# Patient Record
Sex: Male | Born: 1950 | Race: White | Hispanic: No | Marital: Married | Smoking: Never smoker
Health system: Southern US, Community
[De-identification: ages and names within clinical notes are randomized; demographics above are authoritative.]

## PROBLEM LIST (undated history)

## (undated) DIAGNOSIS — K219 Gastro-esophageal reflux disease without esophagitis: Secondary | ICD-10-CM

## (undated) DIAGNOSIS — G709 Myoneural disorder, unspecified: Secondary | ICD-10-CM

## (undated) DIAGNOSIS — I209 Angina pectoris, unspecified: Secondary | ICD-10-CM

## (undated) DIAGNOSIS — F419 Anxiety disorder, unspecified: Secondary | ICD-10-CM

## (undated) DIAGNOSIS — I10 Essential (primary) hypertension: Secondary | ICD-10-CM

## (undated) DIAGNOSIS — R011 Cardiac murmur, unspecified: Secondary | ICD-10-CM

## (undated) DIAGNOSIS — R0602 Shortness of breath: Secondary | ICD-10-CM

## (undated) DIAGNOSIS — Z9289 Personal history of other medical treatment: Secondary | ICD-10-CM

## (undated) DIAGNOSIS — E785 Hyperlipidemia, unspecified: Secondary | ICD-10-CM

## (undated) DIAGNOSIS — T7840XA Allergy, unspecified, initial encounter: Secondary | ICD-10-CM

## (undated) DIAGNOSIS — C801 Malignant (primary) neoplasm, unspecified: Secondary | ICD-10-CM

## (undated) HISTORY — PX: MOHS SURGERY: SUR867

## (undated) HISTORY — DX: Essential (primary) hypertension: I10

## (undated) HISTORY — PX: TESTICLE TORSION REDUCTION: SHX795

## (undated) HISTORY — PX: COLONOSCOPY: SHX174

## (undated) HISTORY — PX: UPPER GASTROINTESTINAL ENDOSCOPY: SHX188

## (undated) HISTORY — DX: Malignant (primary) neoplasm, unspecified: C80.1

## (undated) HISTORY — DX: Hyperlipidemia, unspecified: E78.5

## (undated) HISTORY — DX: Allergy, unspecified, initial encounter: T78.40XA

## (undated) HISTORY — PX: APPENDECTOMY: SHX54

## (undated) HISTORY — DX: Gastro-esophageal reflux disease without esophagitis: K21.9

## (undated) HISTORY — PX: CERVICAL DISC SURGERY: SHX588

---

## 1988-11-20 HISTORY — PX: LUMBAR LAMINECTOMY: SHX95

## 1999-04-15 ENCOUNTER — Encounter: Payer: Self-pay | Admitting: Orthopedic Surgery

## 1999-04-22 ENCOUNTER — Encounter (INDEPENDENT_AMBULATORY_CARE_PROVIDER_SITE_OTHER): Payer: Self-pay | Admitting: Specialist

## 1999-04-22 ENCOUNTER — Encounter: Payer: Self-pay | Admitting: Orthopedic Surgery

## 1999-04-22 ENCOUNTER — Observation Stay (HOSPITAL_COMMUNITY): Admission: RE | Admit: 1999-04-22 | Discharge: 1999-04-23 | Payer: Self-pay | Admitting: Orthopedic Surgery

## 1999-08-17 ENCOUNTER — Emergency Department (HOSPITAL_COMMUNITY): Admission: EM | Admit: 1999-08-17 | Discharge: 1999-08-17 | Payer: Self-pay | Admitting: *Deleted

## 2004-08-03 ENCOUNTER — Ambulatory Visit: Payer: Self-pay | Admitting: Family Medicine

## 2005-08-27 ENCOUNTER — Ambulatory Visit: Payer: Self-pay | Admitting: Family Medicine

## 2005-09-03 ENCOUNTER — Ambulatory Visit: Payer: Self-pay | Admitting: Family Medicine

## 2007-03-24 ENCOUNTER — Telehealth (INDEPENDENT_AMBULATORY_CARE_PROVIDER_SITE_OTHER): Payer: Self-pay | Admitting: *Deleted

## 2007-03-31 ENCOUNTER — Ambulatory Visit: Payer: Self-pay | Admitting: Family Medicine

## 2007-03-31 LAB — CONVERTED CEMR LAB
ALT: 34 units/L (ref 0–53)
AST: 23 units/L (ref 0–37)
Albumin: 3.9 g/dL (ref 3.5–5.2)
Alkaline Phosphatase: 56 units/L (ref 39–117)
BUN: 18 mg/dL (ref 6–23)
Basophils Absolute: 0 10*3/uL (ref 0.0–0.1)
Basophils Relative: 0.4 % (ref 0.0–1.0)
Bilirubin Urine: NEGATIVE
Bilirubin, Direct: 0.2 mg/dL (ref 0.0–0.3)
Blood in Urine, dipstick: NEGATIVE
CO2: 30 meq/L (ref 19–32)
Calcium: 9.1 mg/dL (ref 8.4–10.5)
Chloride: 99 meq/L (ref 96–112)
Cholesterol: 159 mg/dL (ref 0–200)
Creatinine, Ser: 1.1 mg/dL (ref 0.4–1.5)
Eosinophils Absolute: 0.3 10*3/uL (ref 0.0–0.6)
Eosinophils Relative: 5.4 % — ABNORMAL HIGH (ref 0.0–5.0)
GFR calc Af Amer: 90 mL/min
GFR calc non Af Amer: 74 mL/min
Glucose, Bld: 108 mg/dL — ABNORMAL HIGH (ref 70–99)
Glucose, Urine, Semiquant: NEGATIVE
HCT: 41.9 % (ref 39.0–52.0)
HDL: 32.2 mg/dL — ABNORMAL LOW (ref >39.0)
Hemoglobin: 14.4 g/dL (ref 13.0–17.0)
Ketones, urine, test strip: NEGATIVE
LDL Cholesterol: 114 mg/dL — ABNORMAL HIGH (ref 0–99)
Lymphocytes Relative: 23.7 % (ref 12.0–46.0)
MCHC: 34.4 g/dL (ref 30.0–36.0)
MCV: 88.1 fL (ref 78.0–100.0)
Monocytes Absolute: 0.5 10*3/uL (ref 0.2–0.7)
Monocytes Relative: 8.4 % (ref 3.0–11.0)
Neutro Abs: 4.1 10*3/uL (ref 1.4–7.7)
Neutrophils Relative %: 62.1 % (ref 43.0–77.0)
Nitrite: NEGATIVE
PSA: 0.78 ng/mL (ref 0.10–4.00)
Platelets: 223 10*3/uL (ref 150–400)
Potassium: 3.8 meq/L (ref 3.5–5.1)
Protein, U semiquant: NEGATIVE
RBC: 4.76 M/uL (ref 4.22–5.81)
RDW: 13 % (ref 11.5–14.6)
Sodium: 138 meq/L (ref 135–145)
Specific Gravity, Urine: 1.02
TSH: 1.28 microintl units/mL (ref 0.35–5.50)
Total Bilirubin: 1.2 mg/dL (ref 0.3–1.2)
Total CHOL/HDL Ratio: 4.9
Total Protein: 6.2 g/dL (ref 6.0–8.3)
Triglycerides: 64 mg/dL (ref 0–149)
Urobilinogen, UA: 0.2
VLDL: 13 mg/dL (ref 0–40)
WBC Urine, dipstick: NEGATIVE
WBC: 6.4 10*3/uL (ref 4.5–10.5)
pH: 6

## 2007-04-07 ENCOUNTER — Ambulatory Visit: Payer: Self-pay | Admitting: Family Medicine

## 2007-04-07 DIAGNOSIS — I10 Essential (primary) hypertension: Secondary | ICD-10-CM | POA: Insufficient documentation

## 2007-04-07 DIAGNOSIS — K219 Gastro-esophageal reflux disease without esophagitis: Secondary | ICD-10-CM | POA: Insufficient documentation

## 2007-04-07 DIAGNOSIS — E785 Hyperlipidemia, unspecified: Secondary | ICD-10-CM | POA: Insufficient documentation

## 2008-04-19 ENCOUNTER — Ambulatory Visit: Payer: Self-pay | Admitting: Family Medicine

## 2008-04-19 LAB — CONVERTED CEMR LAB
Bilirubin Urine: NEGATIVE
Blood in Urine, dipstick: NEGATIVE
Glucose, Urine, Semiquant: NEGATIVE
Nitrite: NEGATIVE
Specific Gravity, Urine: 1.02
Urobilinogen, UA: 1
WBC Urine, dipstick: NEGATIVE
pH: 7.5

## 2008-04-22 LAB — CONVERTED CEMR LAB
ALT: 28 U/L
AST: 21 U/L
Albumin: 3.8 g/dL
Alkaline Phosphatase: 48 U/L
BUN: 26 mg/dL — ABNORMAL HIGH
Basophils Absolute: 0.1 K/uL
Basophils Relative: 0.8 %
Bilirubin, Direct: 0.1 mg/dL
CO2: 31 meq/L
Calcium: 9.1 mg/dL
Chloride: 102 meq/L
Cholesterol: 140 mg/dL
Creatinine, Ser: 1.3 mg/dL
Eosinophils Absolute: 0.3 K/uL
Eosinophils Relative: 4.4 %
GFR calc Af Amer: 74 mL/min
GFR calc non Af Amer: 61 mL/min
Glucose, Bld: 108 mg/dL — ABNORMAL HIGH
HCT: 42.2 %
HDL: 34.6 mg/dL — ABNORMAL LOW
Hemoglobin: 14.7 g/dL
LDL Cholesterol: 86 mg/dL
Lymphocytes Relative: 21 %
MCHC: 34.9 g/dL
MCV: 89.8 fL
Monocytes Absolute: 0.6 K/uL
Monocytes Relative: 8 %
Neutro Abs: 4.5 K/uL
Neutrophils Relative %: 65.8 %
PSA: 0.84 ng/mL
Platelets: 213 K/uL
Potassium: 3 meq/L — ABNORMAL LOW
RBC: 4.7 M/uL
RDW: 12.6 %
Sodium: 142 meq/L
TSH: 1.2 u[IU]/mL
Total Bilirubin: 0.8 mg/dL
Total CHOL/HDL Ratio: 4
Total Protein: 6.5 g/dL
Triglycerides: 96 mg/dL
VLDL: 19 mg/dL
WBC: 7 10*3/microliter

## 2008-04-26 ENCOUNTER — Ambulatory Visit: Payer: Self-pay | Admitting: Family Medicine

## 2009-04-22 ENCOUNTER — Telehealth: Payer: Self-pay | Admitting: Family Medicine

## 2009-05-19 ENCOUNTER — Telehealth: Payer: Self-pay | Admitting: Family Medicine

## 2009-06-20 ENCOUNTER — Ambulatory Visit: Payer: Self-pay | Admitting: Family Medicine

## 2009-06-20 LAB — CONVERTED CEMR LAB
ALT: 26 units/L (ref 0–53)
AST: 20 units/L (ref 0–37)
Albumin: 4.1 g/dL (ref 3.5–5.2)
Alkaline Phosphatase: 40 units/L (ref 39–117)
BUN: 19 mg/dL (ref 6–23)
Basophils Absolute: 0 10*3/uL (ref 0.0–0.1)
Basophils Relative: 0.4 % (ref 0.0–3.0)
Bilirubin Urine: NEGATIVE
Bilirubin, Direct: 0 mg/dL (ref 0.0–0.3)
Blood in Urine, dipstick: NEGATIVE
CO2: 31 meq/L (ref 19–32)
Calcium: 9.2 mg/dL (ref 8.4–10.5)
Chloride: 99 meq/L (ref 96–112)
Cholesterol: 144 mg/dL (ref 0–200)
Creatinine, Ser: 1.1 mg/dL (ref 0.4–1.5)
Eosinophils Absolute: 0.2 10*3/uL (ref 0.0–0.7)
Eosinophils Relative: 3.9 % (ref 0.0–5.0)
GFR calc non Af Amer: 73.48 mL/min (ref >60)
Glucose, Bld: 107 mg/dL — ABNORMAL HIGH (ref 70–99)
Glucose, Urine, Semiquant: NEGATIVE
HCT: 42.7 % (ref 39.0–52.0)
HDL: 45.2 mg/dL (ref >39.00)
Hemoglobin: 14.7 g/dL (ref 13.0–17.0)
Ketones, urine, test strip: NEGATIVE
LDL Cholesterol: 80 mg/dL (ref 0–99)
Lymphocytes Relative: 25.3 % (ref 12.0–46.0)
Lymphs Abs: 1.5 10*3/uL (ref 0.7–4.0)
MCHC: 34.3 g/dL (ref 30.0–36.0)
MCV: 89.7 fL (ref 78.0–100.0)
Monocytes Absolute: 0.4 10*3/uL (ref 0.1–1.0)
Monocytes Relative: 6.4 % (ref 3.0–12.0)
Neutro Abs: 3.8 10*3/uL (ref 1.4–7.7)
Neutrophils Relative %: 64 % (ref 43.0–77.0)
Nitrite: NEGATIVE
PSA: 1.01 ng/mL (ref 0.10–4.00)
Platelets: 231 10*3/uL (ref 150.0–400.0)
Potassium: 3.6 meq/L (ref 3.5–5.1)
Protein, U semiquant: NEGATIVE
RBC: 4.76 M/uL (ref 4.22–5.81)
RDW: 13.4 % (ref 11.5–14.6)
Sodium: 136 meq/L (ref 135–145)
Specific Gravity, Urine: 1.015
TSH: 0.97 microintl units/mL (ref 0.35–5.50)
Total Bilirubin: 0.6 mg/dL (ref 0.3–1.2)
Total CHOL/HDL Ratio: 3
Total Protein: 6.4 g/dL (ref 6.0–8.3)
Triglycerides: 92 mg/dL (ref 0.0–149.0)
Urobilinogen, UA: 0.2
VLDL: 18.4 mg/dL (ref 0.0–40.0)
WBC Urine, dipstick: NEGATIVE
WBC: 6 10*3/uL (ref 4.5–10.5)
pH: 7

## 2009-06-27 ENCOUNTER — Ambulatory Visit: Payer: Self-pay | Admitting: Family Medicine

## 2009-07-16 ENCOUNTER — Telehealth: Payer: Self-pay | Admitting: Family Medicine

## 2009-11-29 HISTORY — PX: EYE SURGERY: SHX253

## 2010-01-23 ENCOUNTER — Encounter: Payer: Self-pay | Admitting: Family Medicine

## 2010-04-23 NOTE — Progress Notes (Signed)
Summary: special test  Phone Note Call from Patient Call back at [phone]   Caller: Patient Call For: dr todd Summary of Call: pt is sch for cpx labs on 03-28-07 pt was told to ask for a special lab test to be included with cpx labs Initial call taken by: Heron Sabins,  March 24, 2007 1:55 PM    Pull chart

## 2010-04-23 NOTE — Assessment & Plan Note (Signed)
Summary: CPX/CCM   Vital Signs:  Patient Profile:   60 Years Old Male Height:     74.25 inches (191.13 cm) Weight:      268 pounds Temp:     98.4 degrees F oral BP sitting:   138 / 84  (left arm) Cuff size:   regular  Vitals Entered By: Kern Reap CMA (April 26, 2008 9:21 AM)                 Chief Complaint:  cpx.  History of Present Illness: James [name] is a 60 year old, married male, nonsmoker, who works for [employer] and is thinking about retirement after 30 years and comes in today for evaluation of reflux esophagitis, hypertension, hyperlipidemia.  He's always been in excellent health.  He said no chronic health problems except the above.  His blood pressure is treated with Tenoretic 50 -- 25, Cardura 8 mg,.  BP 138/84.  The reflux esophagitis history with Nexium 40 mg q.a.m.  The hyperlipidemia history with Zocor 40 mg nightly along with an aspirin tablet.  Lipids are at goal with an LDL of 86.  A new problem is hypokalemia with a potassium of 3.0, secondary to the diuretic.  Tetanus booster 2005, he said both flu shots 09.     Updated Prior Medication List: NEXIUM 40 MG  CPDR (ESOMEPRAZOLE MAGNESIUM) Take 1 capsule by mouth once a day ATENOLOL-CHLORTHALIDONE 50-25 MG  TABS (ATENOLOL-CHLORTHALIDONE) Take 1 tablet by mouth once a day DOXAZOSIN MESYLATE 8 MG  TABS (DOXAZOSIN MESYLATE) Take 1 tablet by mouth once a day ADULT ASPIRIN EC LOW STRENGTH 81 MG  TBEC (ASPIRIN) qd ZOCOR 40 MG  TABS (SIMVASTATIN) 1 tab @ bedtime * POTASSIUM   Current Allergies: No known allergies   Past Medical History:    Reviewed history from 04/07/2007 and no changes required:       GERD       Hyperlipidemia       Hypertension  Past Surgical History:    Reviewed history from 04/07/2007 and no changes required:       Appendectomy       testicle torsion       cevical disc       cardiac eval       Lumbar laminectomy   Family History:    Reviewed history from 04/07/2007 and no  changes required:       father died at age 84, hypertension, and polio       mother, good health except for skin cancer/melanoma       no brothers       one sister in good health  Social History:    Reviewed history from 04/07/2007 and no changes required:       Occupation: ups       Married       Never Smoked       Alcohol use-no       Drug use-no       Regular exercise-yes       Played baseball it [employer]    Review of Systems      See HPI   Physical Exam  General:     Well-developed,well-nourished,in no acute distress; alert,appropriate and cooperative throughout examination Head:     Normocephalic and atraumatic without obvious abnormalities. No apparent alopecia or balding. Eyes:     No corneal or conjunctival inflammation noted. EOMI. Perrla. Funduscopic exam benign, without hemorrhages, exudates or papilledema. Vision grossly normal. Ears:  External ear exam shows no significant lesions or deformities.  Otoscopic examination reveals clear canals, tympanic membranes are intact bilaterally without bulging, retraction, inflammation or discharge. Hearing is grossly normal bilaterally. Nose:     External nasal examination shows no deformity or inflammation. Nasal mucosa are pink and moist without lesions or exudates. Mouth:     Oral mucosa and oropharynx without lesions or exudates.  Teeth in good repair. Neck:     No deformities, masses, or tenderness noted. Chest Wall:     No deformities, masses, tenderness or gynecomastia noted. Breasts:     No masses or gynecomastia noted Lungs:     Normal respiratory effort, chest expands symmetrically. Lungs are clear to auscultation, no crackles or wheezes. Heart:     Normal rate and regular rhythm. S1 and S2 normal without gallop, murmur, click, rub or other extra sounds. Abdomen:     Bowel sounds positive,abdomen soft and non-tender without masses, organomegaly or hernias noted. Rectal:     No external  abnormalities noted. Normal sphincter tone. No rectal masses or tenderness. Genitalia:     Testes bilaterally descended without nodularity, tenderness or masses. No scrotal masses or lesions. No penis lesions or urethral discharge. Prostate:     Prostate gland firm and smooth, no enlargement, nodularity, tenderness, mass, asymmetry or induration. Msk:     No deformity or scoliosis noted of thoracic or lumbar spine.   Pulses:     R and L carotid,radial,femoral,dorsalis pedis and posterior tibial pulses are full and equal bilaterally Extremities:     No clubbing, cyanosis, edema, or deformity noted with normal full range of motion of all joints.   Neurologic:     No cranial nerve deficits noted. Station and gait are normal. Plantar reflexes are down-going bilaterally. DTRs are symmetrical throughout. Sensory, motor and coordinative functions appear intact. Skin:     Intact without suspicious lesions or rashes Cervical Nodes:     No lymphadenopathy noted Axillary Nodes:     No palpable lymphadenopathy Inguinal Nodes:     No significant adenopathy Psych:     Cognition and judgment appear intact. Alert and cooperative with normal attention span and concentration. No apparent delusions, illusions, hallucinations    Impression & Recommendations:  Problem # 1:  HYPERTENSION (ICD-401.9) Assessment: Improved  His updated medication list for this problem includes:    Atenolol-chlorthalidone 50-25 Mg Tabs (Atenolol-chlorthalidone) .Marland Kitchen... Take 1 tablet by mouth once a day    Doxazosin Mesylate 8 Mg Tabs (Doxazosin mesylate) .Marland Kitchen... Take 1 tablet by mouth once a day  Orders: EKG w/ Interpretation (93000)   Problem # 2:  HYPERLIPIDEMIA (ICD-272.4) Assessment: Improved  His updated medication list for this problem includes:    Zocor 40 Mg Tabs (Simvastatin) .Marland Kitchen... 1 tab @ bedtime  Orders: EKG w/ Interpretation (93000)   Problem # 3:  GERD (ICD-530.81) Assessment: Improved  His updated  medication list for this problem includes:    Nexium 40 Mg Cpdr (Esomeprazole magnesium) .Marland Kitchen... Take 1 capsule by mouth once a day   Complete Medication List: 1)  Nexium 40 Mg Cpdr (Esomeprazole magnesium) .... Take 1 capsule by mouth once a day 2)  Atenolol-chlorthalidone 50-25 Mg Tabs (Atenolol-chlorthalidone) .... Take 1 tablet by mouth once a day 3)  Doxazosin Mesylate 8 Mg Tabs (Doxazosin mesylate) .... Take 1 tablet by mouth once a day 4)  Adult Aspirin Ec Low Strength 81 Mg Tbec (Aspirin) .... Qd 5)  Zocor 40 Mg Tabs (Simvastatin) .Marland Kitchen.. 1 tab @  bedtime 6)  Klor-con M20 20 Meq Cr-tabs (Potassium chloride crys cr) .... Take 1 tablet by mouth every morning   Patient Instructions: 1)  Please schedule a follow-up appointment in 1 year.   Prescriptions: KLOR-CON M20 20 MEQ CR-TABS (POTASSIUM CHLORIDE CRYS CR) Take 1 tablet by mouth every morning  #100 x 3   Entered and Authorized by:   Roderick Pee MD   Signed by:   Roderick Pee MD on 04/26/2008   Method used:   [facility] to        [facility] [phone]* (retail)       [address]       [place], [place]  [address]       Ph: [phone]       Fax: [phone]   RxID:   [id number] ZOCOR 40 MG  TABS (SIMVASTATIN) 1 tab @ bedtime  #100 x 4   Entered and Authorized by:   Roderick Pee MD   Signed by:   Roderick Pee MD on 04/26/2008   Method used:   [facility]ically to        [facility] [phone]* (retail)       [address]       [place], [place]  [address]       Ph: [phone]       Fax: [phone]   RxID:   [id number] DOXAZOSIN MESYLATE 8 MG  TABS (DOXAZOSIN MESYLATE) Take 1 tablet by mouth once a day  #100 x 4   Entered and Authorized by:   Roderick Pee MD   Signed by:   Roderick Pee MD on 04/26/2008   Method used:   [facility] to        [facility] [phone]* (retail)       [address]       [place], [place]  [address]       Ph: [phone]       Fax: [phone]   RxID:   [id number] ATENOLOL-CHLORTHALIDONE 50-25 MG  TABS (ATENOLOL-CHLORTHALIDONE) Take 1 tablet by mouth once a day  #100 x 3   Entered and Authorized by:   Roderick Pee MD   Signed by:   Roderick Pee MD on 04/26/2008   Method used:   Electronically to        [facility] [phone]* (retail)       [address]       [place], [place]  [address]       Ph: [phone]       Fax: [phone]   RxID:   [id number] NEXIUM 40 MG  CPDR (ESOMEPRAZOLE MAGNESIUM) Take 1 capsule by mouth once a day  #100 x 4   Entered and Authorized by:   Roderick Pee MD   Signed by:   Roderick Pee MD on 04/26/2008   Method used:   Electronically to        [facility] [phone]* (retail)       [address]       [place], [place]  [address]       Ph: [phone]       Fax: [phone]   RxID:   [id number]

## 2010-04-23 NOTE — Assessment & Plan Note (Signed)
Summary: CPX // RS   Vital Signs:  Patient profile:   60 year old male Height:      74.25 inches Weight:      248 pounds BMI:     31.74 Temp:     98.8 degrees F oral BP sitting:   120 / 80  (left arm) Cuff size:   regular  Vitals Entered By: Kern Reap CMA Duncan Dull) (June 27, 2009 8:34 AM) CC: cpx   CC:  cpx.  History of Present Illness: James [name] is a 60 year old male, married, nonsmoker, who comes in today for physical examination because of underlying hyperlipidemia and hypertension and reflux esophagitis.  His hyperlipidemia is treated with Zocor 40 mg nightly lipids are goal with an LDL of 80.  His hypertension is treated with Tenoretic 50 -- 25 daily, Cardura 8 mg daily, and potassium supplement 20 mEq one daily.  BP 120/80.  He also uses Nexium 40 mg daily for reflux esophagitis and asymptomatic on medication.  He gets routine eye care and dental care.  Colonoscopy done, and GI, tetanus, 2005, seasonal flu shot 2010.  His weight is down to 248.  He's lost 30 pounds this past winter by stopping drinking sweet tea.  Allergies: No Known Drug Allergies  Past History:  Past medical, surgical, family and social histories (including risk factors) reviewed, and no changes noted (except as noted below).  Past Medical History: Reviewed history from 04/07/2007 and no changes required. GERD Hyperlipidemia Hypertension  Past Surgical History: Reviewed history from 04/26/2008 and no changes required. Appendectomy testicle torsion cevical disc cardiac eval Lumbar laminectomy  Family History: Reviewed history from 04/07/2007 and no changes required. father died at age 22, hypertension, and polio mother, good health except for skin cancer/melanoma no brothers one sister in good health  Social History: Reviewed history from 04/26/2008 and no changes required. Occupation: [employer] Married Never Smoked Alcohol use-no Drug use-no Regular exercise-yes Played baseball it  [employer]  Review of Systems      See HPI  Physical Exam  General:  Well-developed,well-nourished,in no acute distress; alert,appropriate and cooperative throughout examination Head:  Normocephalic and atraumatic without obvious abnormalities. No apparent alopecia or balding. Eyes:  No corneal or conjunctival inflammation noted. EOMI. Perrla. Funduscopic exam benign, without hemorrhages, exudates or papilledema. Vision grossly normal. Ears:  External ear exam shows no significant lesions or deformities.  Otoscopic examination reveals clear canals, tympanic membranes are intact bilaterally without bulging, retraction, inflammation or discharge. Hearing is grossly normal bilaterally. Nose:  External nasal examination shows no deformity or inflammation. Nasal mucosa are pink and moist without lesions or exudates. Mouth:  Oral mucosa and oropharynx without lesions or exudates.  Teeth in good repair. Neck:  No deformities, masses, or tenderness noted. Chest Wall:  No deformities, masses, tenderness or gynecomastia noted. Breasts:  No masses or gynecomastia noted Lungs:  Normal respiratory effort, chest expands symmetrically. Lungs are clear to auscultation, no crackles or wheezes. Heart:  Normal rate and regular rhythm. S1 and S2 normal without gallop, murmur, click, rub or other extra sounds. Abdomen:  Bowel sounds positive,abdomen soft and non-tender without masses, organomegaly or hernias noted. Rectal:  No external abnormalities noted. Normal sphincter tone. No rectal masses or tenderness. Genitalia:  Testes bilaterally descended without nodularity, tenderness or masses. No scrotal masses or lesions. No penis lesions or urethral discharge. Prostate:  Prostate gland firm and smooth, no enlargement, nodularity, tenderness, mass, asymmetry or induration. Msk:  No deformity or scoliosis noted of thoracic  or lumbar spine.   Pulses:  R and L carotid,radial,femoral,dorsalis pedis and posterior  tibial pulses are full and equal bilaterally Extremities:  No clubbing, cyanosis, edema, or deformity noted with normal full range of motion of all joints.   Neurologic:  No cranial nerve deficits noted. Station and gait are normal. Plantar reflexes are down-going bilaterally. DTRs are symmetrical throughout. Sensory, motor and coordinative functions appear intact. Skin:  Intact without suspicious lesions or rashes Cervical Nodes:  No lymphadenopathy noted Axillary Nodes:  No palpable lymphadenopathy Inguinal Nodes:  No significant adenopathy Psych:  Cognition and judgment appear intact. Alert and cooperative with normal attention span and concentration. No apparent delusions, illusions, hallucinations   Impression & Recommendations:  Problem # 1:  HYPERTENSION (ICD-401.9) Assessment Improved  His updated medication list for this problem includes:    Atenolol-chlorthalidone 50-25 Mg Tabs (Atenolol-chlorthalidone) .Marland Kitchen... Take 1 tablet by mouth once a day    Doxazosin Mesylate 8 Mg Tabs (Doxazosin mesylate) .Marland Kitchen... Take 1 tablet by mouth once a day  Orders: Prescription Created Electronically [phone]) EKG w/ Interpretation (93[address])  Problem # 2:  HYPERLIPIDEMIA (ICD-272.4) Assessment: Improved  His updated medication list for this problem includes:    Zocor 40 Mg Tabs (Simvastatin) .Marland Kitchen... 1 tab @ bedtime  Orders: Prescription Created Electronically [phone]) EKG w/ Interpretation (93000)  Problem # 3:  GERD (ICD-530.81) Assessment: Improved  His updated medication list for this problem includes:    Nexium 40 Mg Cpdr (Esomeprazole magnesium) .Marland Kitchen... Take 1 capsule by mouth once a day  Orders: Prescription Created Electronically [phone])  Problem # 4:  PHYSICAL EXAMINATION (ICD-V70.0) Assessment: Unchanged  Orders: Prescription Created Electronically [phone])  Complete Medication List: 1)  Nexium 40 Mg Cpdr (Esomeprazole magnesium) .... Take 1 capsule by mouth once a day 2)   Atenolol-chlorthalidone 50-25 Mg Tabs (Atenolol-chlorthalidone) .... Take 1 tablet by mouth once a day 3)  Doxazosin Mesylate 8 Mg Tabs (Doxazosin mesylate) .... Take 1 tablet by mouth once a day 4)  Adult Aspirin Ec Low Strength 81 Mg Tbec (Aspirin) .... Qd 5)  Zocor 40 Mg Tabs (Simvastatin) .Marland Kitchen.. 1 tab @ bedtime 6)  Klor-con M20 20 Meq Cr-tabs (Potassium chloride crys cr) .... Take 1 tablet by mouth every morning  Patient Instructions: 1)  Please schedule a follow-up appointment in 1 year. 2)  It is important that you exercise regularly at least 20 minutes 5 times a week. If you develop chest pain, have severe difficulty breathing, or feel very tired , stop exercising immediately and seek medical attention. Prescriptions: KLOR-CON M20 20 MEQ CR-TABS (POTASSIUM CHLORIDE CRYS CR) Take 1 tablet by mouth every morning  #100 x 3   Entered and Authorized by:   Roderick Pee MD   Signed by:   Roderick Pee MD on 06/27/2009   Method used:   [facility] to        [facility] [phone]* (retail)       [address]       [place], [place]  [address]       Ph: [phone]       Fax: [phone]   RxID:   [id number] ZOCOR 40 MG  TABS (SIMVASTATIN) 1 tab @ bedtime  #100 x 4   Entered and Authorized by:   Roderick Pee MD   Signed by:   Roderick Pee MD on 06/27/2009   Method used:   [facility] to  [facility] [phone]* (retail)       [address]       [place], [place]  [address]       Ph: [phone]       Fax: [phone]   RxID:   [id number] DOXAZOSIN MESYLATE 8 MG  TABS (DOXAZOSIN MESYLATE) Take 1 tablet by mouth once a day  #100 x 4   Entered and Authorized by:   Roderick Pee MD   Signed by:   Roderick Pee MD on 06/27/2009   Method used:   Electronically to        [facility] [phone]* (retail)       [address]       [place], [place]  [address]       Ph:  [phone]       Fax: [phone]   RxID:   [id number] ATENOLOL-CHLORTHALIDONE 50-25 MG  TABS (ATENOLOL-CHLORTHALIDONE) Take 1 tablet by mouth once a day  #100 x 3   Entered and Authorized by:   Roderick Pee MD   Signed by:   Roderick Pee MD on 06/27/2009   Method used:   Electronically to        [facility] [phone]* (retail)       [address]       [place], [place]  [address]       Ph: [phone]       Fax: [phone]   RxID:   [id number] NEXIUM 40 MG  CPDR (ESOMEPRAZOLE MAGNESIUM) Take 1 capsule by mouth once a day  #100 x 4   Entered and Authorized by:   Roderick Pee MD   Signed by:   Roderick Pee MD on 06/27/2009   Method used:   Electronically to        [facility] [phone]* (retail)       [address]       [place], [place]  [address]       Ph: [phone]       Fax: [phone]   RxID:   [id number]    Immunization History:  Influenza Immunization History:    Influenza:  historical (12/20/2008)

## 2010-04-23 NOTE — Assessment & Plan Note (Signed)
Summary: cpx/nta   Vital Signs:  Patient Profile:   60 Years Old Male Height:     75.25 inches (191.13 cm) Weight:      251 pounds (114.09 kg) Temp:     98.5 degrees F (36.94 degrees C) oral Pulse rate:   79 / minute BP sitting:   130 / 80  (right arm)  Pt. in pain?   no  Vitals Entered By: Arcola Jansky, RN (April 07, 2007 9:46 AM)                  Chief Complaint:  CPX and LABS DONE.  History of Present Illness: James [name] is a 60 year old male who comes in today for evaluation of hypertension, hyperlipidemia, reflux, esophagitis,  Problem number one hypertension.  He checks his blood pressure sporadically at work.  He takes Tenoretic 5020 five half a tablet a day with good blood pressure control.  Is currently taking Lipitor 20 mg daily for hyperlipidemia.  We discussed switching to go to Zocor because of cost implications is okay with that.  He also takes Cardura to help with his blood pressure.  He also takes 40 mg of Nexium because of chronic reflux esophagitis.  Asymptomatic.  If he doesn't take his medications.  Acute Visit History:      He denies abdominal pain, chest pain, constipation, cough, diarrhea, earache, eye symptoms, fever, genitourinary symptoms, headache, musculoskeletal symptoms, nasal discharge, nausea, rash, sinus problems, sore throat, and vomiting.        The patient has a prior history of GERD.  Pertinent previous surgeries include appendectomy.         Current Allergies (reviewed today): No known allergies   Past Medical History:    Reviewed history and no changes required:       GERD       Hyperlipidemia       Hypertension  Past Surgical History:    Appendectomy    testicle torsion    cevical disc    cardiac eval   Family History:    Reviewed history and no changes required:       father died at age 89, hypertension, and polio       mother, good health except for skin cancer/melanoma       no brothers       one sister in  good health  Social History:    Reviewed history and no changes required:       Occupation: ups       Married       Never Smoked       Alcohol use-no       Drug use-no       Regular exercise-yes   Risk Factors:  Tobacco use:  never Drug use:  no Alcohol use:  no Exercise:  yes   Review of Systems      See HPI   Physical Exam  General:     Well-developed,well-nourished,in no acute distress; alert,appropriate and cooperative throughout examination Head:     Normocephalic and atraumatic without obvious abnormalities. No apparent alopecia or balding. Eyes:     No corneal or conjunctival inflammation noted. EOMI. Perrla. Funduscopic exam benign, without hemorrhages, exudates or papilledema. Vision grossly normal. Ears:     External ear exam shows no significant lesions or deformities.  Otoscopic examination reveals clear canals, tympanic membranes are intact bilaterally without bulging, retraction, inflammation or discharge. Hearing is grossly normal bilaterally. Nose:     External  nasal examination shows no deformity or inflammation. Nasal mucosa are pink and moist without lesions or exudates. Mouth:     Oral mucosa and oropharynx without lesions or exudates.  Teeth in good repair. Neck:     No deformities, masses, or tenderness noted. Chest Wall:     No deformities, masses, tenderness or gynecomastia noted. Breasts:     No masses or gynecomastia noted Lungs:     Normal respiratory effort, chest expands symmetrically. Lungs are clear to auscultation, no crackles or wheezes. Heart:     Normal rate and regular rhythm. S1 and S2 normal without gallop, murmur, click, rub or other extra sounds. Abdomen:     Bowel sounds positive,abdomen soft and non-tender without masses, organomegaly or hernias noted. Rectal:     No external abnormalities noted. Normal sphincter tone. No rectal masses or tenderness. Genitalia:     Testes bilaterally descended without nodularity,  tenderness or masses. No scrotal masses or lesions. No penis lesions or urethral discharge. Prostate:     Prostate gland firm and smooth, no enlargement, nodularity, tenderness, mass, asymmetry or induration. Msk:     No deformity or scoliosis noted of thoracic or lumbar spine.   Pulses:     R and L carotid,radial,femoral,dorsalis pedis and posterior tibial pulses are full and equal bilaterally Extremities:     No clubbing, cyanosis, edema, or deformity noted with normal full range of motion of all joints.   Neurologic:     No cranial nerve deficits noted. Station and gait are normal. Plantar reflexes are down-going bilaterally. DTRs are symmetrical throughout. Sensory, motor and coordinative functions appear intact. Skin:     Intact without suspicious lesions or rashes Cervical Nodes:     No lymphadenopathy noted Axillary Nodes:     No palpable lymphadenopathy Inguinal Nodes:     No significant adenopathy Psych:     Cognition and judgment appear intact. Alert and cooperative with normal attention span and concentration. No apparent delusions, illusions, hallucinations    Impression & Recommendations:  Problem # 1:  GERD (ICD-530.81) Assessment: Improved  His updated medication list for this problem includes:    Nexium 40 Mg Cpdr (Esomeprazole magnesium) .Marland Kitchen... Take 1 capsule by mouth once a day   Problem # 2:  HYPERLIPIDEMIA (ICD-272.4) Assessment: Improved  His updated medication list for this problem includes:    Zocor 40 Mg Tabs (Simvastatin) .Marland Kitchen... 1 tab @ bedtime   Problem # 3:  HYPERTENSION (ICD-401.9) Assessment: Improved  His updated medication list for this problem includes:    Atenolol-chlorthalidone 50-25 Mg Tabs (Atenolol-chlorthalidone) .Marland Kitchen... Take 1 tablet by mouth once a day    Doxazosin Mesylate 8 Mg Tabs (Doxazosin mesylate) .Marland Kitchen... Take 1 tablet by mouth once a day   Complete Medication List: 1)  Nexium 40 Mg Cpdr (Esomeprazole magnesium) .... Take 1  capsule by mouth once a day 2)  Atenolol-chlorthalidone 50-25 Mg Tabs (Atenolol-chlorthalidone) .... Take 1 tablet by mouth once a day 3)  Doxazosin Mesylate 8 Mg Tabs (Doxazosin mesylate) .... Take 1 tablet by mouth once a day 4)  Adult Aspirin Ec Low Strength 81 Mg Tbec (Aspirin) .... Qd 5)  Zocor 40 Mg Tabs (Simvastatin) .Marland Kitchen.. 1 tab @ bedtime  Other Orders: EKG w/ Interpretation (93000)   Patient Instructions: 1)  Please schedule a follow-up appointment in 1 year. 2)  It is important that you exercise regularly at least 20 minutes 5 times a week. If you develop chest pain, have severe difficulty breathing, or  feel very tired , stop exercising immediately and seek medical attention. 3)  Take an Aspirin every day.    Prescriptions: DOXAZOSIN MESYLATE 8 MG  TABS (DOXAZOSIN MESYLATE) Take 1 tablet by mouth once a day  #100 x 4   Entered and Authorized by:   Roderick Pee MD   Signed by:   Roderick Pee MD on 04/07/2007   Method used:   Print then Give to Patient   RxID:   [id number] ATENOLOL-CHLORTHALIDONE 50-25 MG  TABS (ATENOLOL-CHLORTHALIDONE) Take 1 tablet by mouth once a day  #50 x 4   Entered and Authorized by:   Roderick Pee MD   Signed by:   Roderick Pee MD on 04/07/2007   Method used:   Print then Give to Patient   RxID:   [id number] NEXIUM 40 MG  CPDR (ESOMEPRAZOLE MAGNESIUM) Take 1 capsule by mouth once a day  #100 x 4   Entered and Authorized by:   Roderick Pee MD   Signed by:   Roderick Pee MD on 04/07/2007   Method used:   Print then Give to Patient   RxID:   [id number] ZOCOR 40 MG  TABS (SIMVASTATIN) 1 tab @ bedtime  #100 x 4   Entered and Authorized by:   Roderick Pee MD   Signed by:   Roderick Pee MD on 04/07/2007   Method used:   Print then Give to Patient   RxID:   [id number]  ]  Tetanus/Td Immunization History:    Tetanus/Td # 1:  Td (03/23/2003)  Influenza Immunization History:    Influenza # 1:  Historical  (12/21/2006)

## 2010-04-23 NOTE — Progress Notes (Signed)
Summary: REFILL  Phone Note Refill Request Message from:  Fax from Pharmacy  Refills Requested: Medication #1:  KLOR-CON M20 20 MEQ CR-TABS Take 1 tablet by mouth every morning. [phone]---[phone]    FAX---[phone]  Initial call taken by: Warnell Forester,  May 19, 2009 8:50 AM    Prescriptions: KLOR-CON M20 20 MEQ CR-TABS (POTASSIUM CHLORIDE CRYS CR) Take 1 tablet by mouth every morning  #100 x 3   Entered by:   Kern Reap CMA (AAMA)   Authorized by:   Roderick Pee MD   Signed by:   Kern Reap CMA (AAMA) on 05/19/2009   Method used:   Electronically to        [facility] [phone][facility] (retail)       [address]       [place], [place]  [address]       Ph: [phone]       Fax: [phone]   RxID:   [id number]

## 2010-04-23 NOTE — Miscellaneous (Signed)
Summary: eye exam  Clinical Lists Changes       Eye Exam  significant right cataract

## 2010-04-23 NOTE — Progress Notes (Signed)
Summary: refill  Phone Note Refill Request Message from:  Fax from Pharmacy on April 22, 2009 3:22 PM  Refills Requested: Medication #1:  KLOR-CON M20 20 MEQ CR-TABS Take 1 tablet by mouth every morning.  Method Requested: Electronic Initial call taken by: Kern Reap CMA Duncan Dull),  April 22, 2009 3:22 PM    Prescriptions: KLOR-CON M20 20 MEQ CR-TABS (POTASSIUM CHLORIDE CRYS CR) Take 1 tablet by mouth every morning  #100 x 3   Entered by:   Kern Reap CMA (AAMA)   Authorized by:   Roderick Pee MD   Signed by:   Kern Reap CMA (AAMA) on 04/22/2009   Method used:   Electronically to        [facility] [phone]* (retail)       [address]       [place], [place]  [address]       Ph: [phone]       Fax: [phone]   RxID:   [id number]

## 2010-04-23 NOTE — Progress Notes (Signed)
Summary: nexium refill  Phone Note Refill Request Message from:  Fax from Pharmacy on July 16, 2009 11:43 AM  Refills Requested: Medication #1:  NEXIUM 40 MG  CPDR Take 1 capsule by mouth once a day Initial call taken by: Kern Reap CMA Duncan Dull),  July 16, 2009 11:43 AM    Prescriptions: NEXIUM 40 MG  CPDR (ESOMEPRAZOLE MAGNESIUM) Take 1 capsule by mouth once a day  #100 x 4   Entered by:   Kern Reap CMA (AAMA)   Authorized by:   Roderick Pee MD   Signed by:   Kern Reap CMA (AAMA) on 07/16/2009   Method used:   Electronically to        [facility] [phone]* (retail)       [address]       [place], [place]  [address]       Ph: [phone]       Fax: [phone]   RxID:   [id number]

## 2010-05-22 ENCOUNTER — Other Ambulatory Visit: Payer: Self-pay | Admitting: Family Medicine

## 2010-06-19 ENCOUNTER — Other Ambulatory Visit (INDEPENDENT_AMBULATORY_CARE_PROVIDER_SITE_OTHER): Payer: BC Managed Care – PPO

## 2010-06-19 DIAGNOSIS — Z0000 Encounter for general adult medical examination without abnormal findings: Secondary | ICD-10-CM

## 2010-06-19 LAB — POCT URINALYSIS DIPSTICK
Bilirubin, UA: NEGATIVE
Blood, UA: NEGATIVE
Glucose, UA: NEGATIVE
Ketones, UA: NEGATIVE
Leukocytes, UA: NEGATIVE
Nitrite, UA: NEGATIVE
Protein, UA: NEGATIVE
Spec Grav, UA: 1.015
Urobilinogen, UA: 0.2
pH, UA: 7

## 2010-06-19 LAB — LIPID PANEL
Cholesterol: 149 mg/dL (ref 0–200)
HDL: 38.7 mg/dL — ABNORMAL LOW (ref >39.00)
LDL Cholesterol: 92 mg/dL (ref 0–99)
Total CHOL/HDL Ratio: 4
Triglycerides: 93 mg/dL (ref 0.0–149.0)
VLDL: 18.6 mg/dL (ref 0.0–40.0)

## 2010-06-19 LAB — HEPATIC FUNCTION PANEL
ALT: 25 U/L (ref 0–53)
AST: 22 U/L (ref 0–37)
Albumin: 3.9 g/dL (ref 3.5–5.2)
Alkaline Phosphatase: 50 U/L (ref 39–117)
Bilirubin, Direct: 0.2 mg/dL (ref 0.0–0.3)
Total Bilirubin: 0.7 mg/dL (ref 0.3–1.2)
Total Protein: 6.3 g/dL (ref 6.0–8.3)

## 2010-06-19 LAB — BASIC METABOLIC PANEL
BUN: 20 mg/dL (ref 6–23)
CO2: 32 mEq/L (ref 19–32)
Calcium: 9 mg/dL (ref 8.4–10.5)
Chloride: 102 mEq/L (ref 96–112)
Creatinine, Ser: 1.2 mg/dL (ref 0.4–1.5)
GFR: 69.56 mL/min (ref >60.00)
Glucose, Bld: 102 mg/dL — ABNORMAL HIGH (ref 70–99)
Potassium: 3.3 mEq/L — ABNORMAL LOW (ref 3.5–5.1)
Sodium: 141 mEq/L (ref 135–145)

## 2010-06-19 LAB — CBC WITH DIFFERENTIAL/PLATELET
Basophils Absolute: 0 10*3/uL (ref 0.0–0.1)
Basophils Relative: 0.4 % (ref 0.0–3.0)
Eosinophils Absolute: 0.3 10*3/uL (ref 0.0–0.7)
Eosinophils Relative: 5 % (ref 0.0–5.0)
HCT: 43.3 % (ref 39.0–52.0)
Hemoglobin: 14.8 g/dL (ref 13.0–17.0)
Lymphocytes Relative: 25.1 % (ref 12.0–46.0)
Lymphs Abs: 1.6 10*3/uL (ref 0.7–4.0)
MCHC: 34.2 g/dL (ref 30.0–36.0)
MCV: 89.6 fl (ref 78.0–100.0)
Monocytes Absolute: 0.5 10*3/uL (ref 0.1–1.0)
Monocytes Relative: 8.1 % (ref 3.0–12.0)
Neutro Abs: 3.9 10*3/uL (ref 1.4–7.7)
Neutrophils Relative %: 61.4 % (ref 43.0–77.0)
Platelets: 228 10*3/uL (ref 150.0–400.0)
RBC: 4.83 Mil/uL (ref 4.22–5.81)
RDW: 13.5 % (ref 11.5–14.6)
WBC: 6.3 10*3/uL (ref 4.5–10.5)

## 2010-06-19 LAB — PSA: PSA: 0.94 ng/mL (ref 0.10–4.00)

## 2010-06-19 LAB — TSH: TSH: 0.98 u[IU]/mL (ref 0.35–5.50)

## 2010-07-03 ENCOUNTER — Other Ambulatory Visit: Payer: Self-pay | Admitting: Family Medicine

## 2010-07-03 ENCOUNTER — Ambulatory Visit: Payer: Self-pay | Admitting: Family Medicine

## 2010-07-04 ENCOUNTER — Other Ambulatory Visit: Payer: Self-pay | Admitting: Family Medicine

## 2010-07-31 ENCOUNTER — Encounter: Payer: Self-pay | Admitting: Family Medicine

## 2010-08-03 ENCOUNTER — Encounter: Payer: Self-pay | Admitting: Family Medicine

## 2010-08-03 ENCOUNTER — Ambulatory Visit (INDEPENDENT_AMBULATORY_CARE_PROVIDER_SITE_OTHER): Payer: BC Managed Care – PPO | Admitting: Family Medicine

## 2010-08-03 VITALS — BP 130/88 | Temp 99.0°F | Ht 74.5 in | Wt 252.0 lb

## 2010-08-03 DIAGNOSIS — I10 Essential (primary) hypertension: Secondary | ICD-10-CM

## 2010-08-03 DIAGNOSIS — E785 Hyperlipidemia, unspecified: Secondary | ICD-10-CM

## 2010-08-03 DIAGNOSIS — K219 Gastro-esophageal reflux disease without esophagitis: Secondary | ICD-10-CM

## 2010-08-03 MED ORDER — POTASSIUM CHLORIDE CRYS ER 20 MEQ PO TBCR: 20.0000 meq | EXTENDED_RELEASE_TABLET | Freq: Every day | ORAL | Status: DC

## 2010-08-03 MED ORDER — ESOMEPRAZOLE MAGNESIUM 40 MG PO CPDR: 40.0000 mg | DELAYED_RELEASE_CAPSULE | Freq: Every day | ORAL | Status: DC

## 2010-08-03 MED ORDER — SIMVASTATIN 40 MG PO TABS: 40.0000 mg | ORAL_TABLET | Freq: Every day | ORAL | Status: DC

## 2010-08-03 MED ORDER — DOXAZOSIN MESYLATE 8 MG PO TABS: 8.0000 mg | ORAL_TABLET | Freq: Every day | ORAL | Status: DC

## 2010-08-03 MED ORDER — ATENOLOL-CHLORTHALIDONE 50-25 MG PO TABS: 1.0000 | ORAL_TABLET | Freq: Every day | ORAL | Status: DC

## 2010-08-03 NOTE — Progress Notes (Signed)
  Subjective:    Patient ID: James [name], male    DOB: August 26, 1950, 60 y.o.   MRN: [id number]  HPI  [name] is a 60 y/o married male nonsmoker who comes in  Today For his annual physical examination because of a history of underlying hypertension, reflux, esophagitis, and hyperlipidemia.  For hypertension.  He takes Tenoretic 50 -- 25 daily.  BP 130/88.  Four reflux esophagitis.  He takes Nexium 40 mg daily.  For hyperlipidemia.  He takes simvastatin 40 mg daily.  He also takes Cardura 8 mg daily for hypertension, and a potassium supplement.  He gets routine eye care,,,,,,,,,,, recent right cataract removal.  Routine dental care, and colonoscopy 2005, normal tetanus 2005.   Review of Systems  Constitutional: Negative.   HENT: Negative.   Eyes: Negative.   Respiratory: Negative.   Cardiovascular: Negative.   Gastrointestinal: Negative.   Genitourinary: Negative.   Musculoskeletal: Negative.   Skin: Negative.   Neurological: Negative.   Hematological: Negative.   Psychiatric/Behavioral: Negative.        Objective:   Physical Exam  Constitutional: He is oriented to person, place, and time. He appears well-developed and well-nourished.  HENT:  Head: Normocephalic and atraumatic.  Right Ear: External ear normal.  Left Ear: External ear normal.  Nose: Nose normal.  Mouth/Throat: Oropharynx is clear and moist.  Eyes: Conjunctivae and EOM are normal. Pupils are equal, round, and reactive to light.  Neck: Normal range of motion. Neck supple. No JVD present. No tracheal deviation present. No thyromegaly present.  Cardiovascular: Normal rate, regular rhythm, normal heart sounds and intact distal pulses.  Exam reveals no gallop and no friction rub.   No murmur heard. Pulmonary/Chest: Effort normal and breath sounds normal. No stridor. No respiratory distress. He has no wheezes. He has no rales. He exhibits no tenderness.  Abdominal: Soft. Bowel sounds are normal. He exhibits  no distension and no mass. There is no tenderness. There is no rebound and no guarding.  Genitourinary: Rectum normal, prostate normal and penis normal. Guaiac negative stool. No penile tenderness.  Musculoskeletal: Normal range of motion. He exhibits no edema and no tenderness.  Lymphadenopathy:    He has no cervical adenopathy.  Neurological: He is alert and oriented to person, place, and time. He has normal reflexes. No cranial nerve deficit. He exhibits normal muscle tone.  Skin: Skin is warm and dry. No rash noted. No erythema. No pallor.  Psychiatric: He has a normal mood and affect. His behavior is normal. Judgment and thought content normal.          Assessment & Plan:  Hypertension continue Tenoretic 50 -- 25 daily, Cardura, 8 mg daily, and one, potassium supplement.  Reflux esophagitis.  Continue Nexium 40 mg daily.  Hyperlipidemia.  Continue simvastatin 40 mg daily.  Rectal irritation use the steroid cream 3 times daily

## 2010-08-03 NOTE — Patient Instructions (Signed)
Continue your current medications.  Follow-up in one year, sooner if any problems.  Use the OTC steroid cream two to 3 times daily for the rectal irritation

## 2010-08-04 ENCOUNTER — Ambulatory Visit: Payer: Self-pay | Admitting: Family Medicine

## 2010-08-07 ENCOUNTER — Telehealth: Payer: Self-pay | Admitting: Family Medicine

## 2010-08-07 NOTE — Telephone Encounter (Signed)
i spoke with the pharmacy and they have been ready from 07/05/10.

## 2010-08-07 NOTE — Telephone Encounter (Signed)
Please resend his 5 rxs for 90 day supply w/[facility]ills [facility]

## 2010-11-24 ENCOUNTER — Ambulatory Visit (INDEPENDENT_AMBULATORY_CARE_PROVIDER_SITE_OTHER): Payer: BC Managed Care – PPO | Admitting: Internal Medicine

## 2010-11-24 ENCOUNTER — Telehealth: Payer: Self-pay | Admitting: *Deleted

## 2010-11-24 ENCOUNTER — Encounter: Payer: Self-pay | Admitting: Internal Medicine

## 2010-11-24 ENCOUNTER — Ambulatory Visit (INDEPENDENT_AMBULATORY_CARE_PROVIDER_SITE_OTHER)
Admission: RE | Admit: 2010-11-24 | Discharge: 2010-11-24 | Disposition: A | Discharge status: Home / Self Care | Payer: BC Managed Care – PPO | Source: Ambulatory Visit | Attending: Internal Medicine | Admitting: Internal Medicine

## 2010-11-24 VITALS — BP 120/70 | HR 116 | Temp 98.7°F | Wt 256.0 lb

## 2010-11-24 DIAGNOSIS — R05 Cough: Secondary | ICD-10-CM

## 2010-11-24 DIAGNOSIS — R059 Cough, unspecified: Secondary | ICD-10-CM

## 2010-11-24 DIAGNOSIS — R0600 Dyspnea, unspecified: Secondary | ICD-10-CM

## 2010-11-24 DIAGNOSIS — R0989 Other specified symptoms and signs involving the circulatory and respiratory systems: Secondary | ICD-10-CM

## 2010-11-24 DIAGNOSIS — I10 Essential (primary) hypertension: Secondary | ICD-10-CM

## 2010-11-24 DIAGNOSIS — R0609 Other forms of dyspnea: Secondary | ICD-10-CM

## 2010-11-24 MED ORDER — HYDROCODONE-HOMATROPINE 5-1.5 MG/5ML PO SYRP: 5.0000 mL | ORAL_SOLUTION | ORAL | Status: AC | PRN

## 2010-11-24 MED ORDER — AZITHROMYCIN 250 MG PO TABS: ORAL_TABLET | ORAL | Status: AC

## 2010-11-24 MED ORDER — PREDNISONE 20 MG PO TABS: ORAL_TABLET | ORAL | Status: DC

## 2010-11-24 NOTE — Patient Instructions (Signed)
Get chest x ray and depending on results will e mail in medication antibiotic and poss some medication for wheezing.  Will let you know results today and plan Expect improvement in 3-5 days either way but cough could last for a while. Can use cough med for comfort.

## 2010-11-24 NOTE — Progress Notes (Signed)
  Subjective:    Patient ID: James [name], male    DOB: [birth year], 60 y.o.   MRN: [id number]  HPI Comes in for acute sda visit today Onset 10+ days like a head and chest cold and now getting worse.  Coughing up green phelgm   Frequently and now SOB doe ? Wheeze  Appetite seems to be ok.  Terrible spasm of cough   back hurts to cough.   Taking robitussin cough and then tylenol.  cold  No hx of asthma and   Neg tobacco  .   .  Or ets  Works [employer] Wife had illness first  And got a shot and z pack    And made her better  Review of Systems Neg fever  But feels tired  Sob a above no rash vomiting  Edema    Past history family history social history reviewed in the electronic medical record.     Objective:   Physical Exam  WDWN in nad looks tired and hoarse .  Quiet resp .    HEENT: Normocephalic ;atraumatic , Eyes;  PERRL, EOMs  Full, lids and conjunctiva clear,,Ears: no deformities, canals nl, TM landmarks normal, Nose: no deformity or discharge  Mouth : OP clear without lesion or edema . Neck no masses or bruits Chest:  ? If dec bs left base  without  Obvious wheezes rales or rhonchi    CV:  S1-S2 no gallops or murmurs peripheral perfusion is normal No clubbing cyanosis or edema  repeat pulse ox 94- 96      Assessment & Plan:  Cough? Bacterial vs viral  bronchitis vs early pneumonia pneumonitis  Poss rad with this   DOE SOB   Get c ray today and plan  Further rx .

## 2010-11-24 NOTE — Telephone Encounter (Signed)
Pt aware and rx sent to pharmacy. 

## 2010-11-24 NOTE — Telephone Encounter (Signed)
Message copied by Romualdo Bolk on Tue Nov 24, 2010  5:14 PM ------      Message from: [facility]      Created: Tue Nov 24, 2010  4:47 PM       Tell patient that   X ray shows no pneumonia .   Please email in a z- pack and  Prednisone 20 mg 3 po qd for 2 days then 2 po qd for 3 days disp  12  . Expect improvement in 5 days or less ...      Call if fever or worsening.

## 2010-11-25 ENCOUNTER — Telehealth: Payer: Self-pay | Admitting: *Deleted

## 2010-11-25 DIAGNOSIS — R05 Cough: Secondary | ICD-10-CM | POA: Insufficient documentation

## 2010-11-25 DIAGNOSIS — R0600 Dyspnea, unspecified: Secondary | ICD-10-CM | POA: Insufficient documentation

## 2010-11-25 DIAGNOSIS — R059 Cough, unspecified: Secondary | ICD-10-CM | POA: Insufficient documentation

## 2010-11-25 NOTE — Telephone Encounter (Signed)
Pt took all 3 drugs last night but was itching all last night. Pt was wondering if it could be the cough syrup that caused this. I explained to the patient that this was probably it. To go ahead and take the other 2 medications and wait a while. If he still has itching then he needs to call us. Otherwise he can try the cough syrup later and see how that helps. I told him that he could also take benadryl prior to taking the cough syrup if he wants. Otherwise, try delsym over the counter if needed.

## 2011-03-23 DIAGNOSIS — Z9289 Personal history of other medical treatment: Secondary | ICD-10-CM

## 2011-03-23 HISTORY — DX: Personal history of other medical treatment: Z92.89

## 2011-04-26 ENCOUNTER — Encounter (INDEPENDENT_AMBULATORY_CARE_PROVIDER_SITE_OTHER): Payer: Self-pay | Admitting: Surgery

## 2011-04-26 ENCOUNTER — Ambulatory Visit (INDEPENDENT_AMBULATORY_CARE_PROVIDER_SITE_OTHER): Payer: BC Managed Care – PPO | Admitting: Surgery

## 2011-04-26 VITALS — BP 122/76 | HR 93 | Temp 98.0°F | Ht 76.0 in | Wt 250.4 lb

## 2011-04-26 DIAGNOSIS — K6289 Other specified diseases of anus and rectum: Secondary | ICD-10-CM

## 2011-04-26 NOTE — Progress Notes (Signed)
Patient ID: James [name], male   DOB: 1950-09-26, 61 y.o.   MRN: [id number]  Chief Complaint  Patient presents with  . Pre-op Exam    eval hems    HPI James [name] is a 61 y.o. male.   HPI The patient is sent at the request of Dr. Tawanna Cooler due to itching at his anus for 3 months. He has a history of hemorrhoids. He denies any bleeding or pain. He itching has been treated with topical hemorrhoidal creams. He continues to have itching. He denies any constipation. He denies any prolonged sitting on the commode. He denies any straining.  Past Medical History  Diagnosis Date  . GERD (gastroesophageal reflux disease)   . Hyperlipidemia   . Hypertension     Past Surgical History  Procedure Date  . Appendectomy   . Testicle torsion reduction   . Cervical disc surgery   . Lumbar laminectomy   . Eye surgery 09&12/2009    Family History  Problem Relation Age of Onset  . Cancer Mother     skin cancer/melanoma  . Hypertension Father   . Heart disease Father     Social History History  Substance Use Topics  . Smoking status: Never Smoker   . Smokeless tobacco: Not on file  . Alcohol Use: Yes     1 bottle wine a week    Allergies  Allergen Reactions  . Hydrocodone Itching    Current Outpatient Prescriptions  Medication Sig Dispense Refill  . aspirin 81 MG tablet Take 81 mg by mouth daily.        Marland Kitchen atenolol-chlorthalidone (TENORETIC) 50-25 MG per tablet Take 1 tablet by mouth daily.  100 tablet  3  . doxazosin (CARDURA) 8 MG tablet Take 1 tablet (8 mg total) by mouth at bedtime.  100 tablet  3  . esomeprazole (NEXIUM) 40 MG capsule Take 1 capsule (40 mg total) by mouth daily before breakfast.  100 capsule  3  . hydrocortisone (ANUSOL-HC) 25 MG suppository Place 25 mg rectally 2 (two) times daily.      . potassium chloride SA (KLOR-CON M20) 20 MEQ tablet Take 1 tablet (20 mEq total) by mouth daily.  100 tablet  3  . predniSONE (DELTASONE) 20 MG tablet Take 3 daily for 2 days, 2  daily for 3 days or as directed  12 tablet  0  . simvastatin (ZOCOR) 40 MG tablet Take 1 tablet (40 mg total) by mouth at bedtime.  100 tablet  3    Review of Systems Review of Systems  Constitutional: Negative for fever, chills and unexpected weight change.  HENT: Negative for hearing loss, congestion, sore throat, trouble swallowing and voice change.   Eyes: Negative for visual disturbance.  Respiratory: Negative for cough and wheezing.   Cardiovascular: Negative for chest pain, palpitations and leg swelling.  Gastrointestinal: Negative for nausea, vomiting, abdominal pain, diarrhea, constipation, blood in stool, abdominal distention, anal bleeding and rectal pain.  Genitourinary: Negative for hematuria and difficulty urinating.  Musculoskeletal: Negative for arthralgias.  Skin: Negative for rash and wound.  Neurological: Negative for seizures, syncope, weakness and headaches.  Hematological: Negative for adenopathy. Does not bruise/bleed easily.  Psychiatric/Behavioral: Negative for confusion.    Blood pressure 122/76, pulse 93, temperature 98 F (36.7 C), temperature source Temporal, height 6\' 4"  (1.93 m), weight 250 lb 6.4 oz (113.581 kg), SpO2 94.00%.  Physical Exam Physical Exam  Constitutional: He is oriented to person, place, and time. He appears well-developed and well-nourished.  HENT:  Head: Normocephalic and atraumatic.  Eyes: EOM are normal. Pupils are equal, round, and reactive to light.  Neck: Normal range of motion. Neck supple.  Pulmonary/Chest: Effort normal and breath sounds normal.  Abdominal: Soft.  Genitourinary:       Anoscopy done in office. Small external skin tag noted left lateral position. Significant irritation to skin of anal canal and atrophy. Minimal internal hemorrhoid disease involving 3 columns with no bleeding or prolapse grade 1. Tone normal.  Neurological: He is alert and oriented to person, place, and time.  Skin: Skin is warm.    Data  Reviewed   Assessment    Anal irritation with minimal internal hemorrhoid disease    Plan    Local care. I've asked him to stop applying topical agents. The vast and add a fiber supplement. I've asked him to keep the area dry. He will return in 3 weeks for me to reexamine him. If the itching continues to be an issue, sclerotherapy can be the next step.       Tomoko Sandra A. 04/26/2011, 10:38 AM

## 2011-04-26 NOTE — Patient Instructions (Signed)
Anal Itching Itching around the anus is a common problem. It is usually not dangerous. It often is caused by skin irritation from stool, moisture, soaps, or clothing. Other causes are pinworms, especially if the itching is worse at night. In adults, the itching may be due to hemorrhoids. In some cases, the cause is unknown. Itching usually can be controlled by keeping the anal area clean and dry. CAUSES   Loose or sticky stool from diarrhea or rectal leakage.   Hemorrhoids. They allow stool to stick to the rectal area.   Certain foods. Be sure to discuss your diet with your caregiver.   Dry skin or skin diseases.   Infections such as a local yeast infection or certain sexually transmitted diseases (STDs).   Worms (parasites).   Diseases of the anus. These include abscesses, fissures, fistulas or cancer.   Sometimes a cause cannot be found.  DIAGNOSIS   Your caregiver will take your history and examine you. A careful exam of the anus is important. Your caregiver will inspect the outer area of your anus and will do a rectal exam.   Sometimes your caregiver will need to look inside the anus. This is a simple procedure that may be a little uncomfortable but usually does not require anesthesia.   If abnormalities are found, a biopsy might be done or you may be referred to a specialist.  TREATMENT  The treatment of your condition will depend on the cause.   Your caregiver will advise you on treatment of any disease found.   If you have rectal leakage or loose stools, a diet high in fiber or a fiber supplement should improve your condition.   Avoid foods or substances that might be causing your itching.   Gentle care of your anal area is important to avoid worsening the irritation.  HOME CARE INSTRUCTIONS  Do not rub or scratch the area. This makes the itching worse. It could worsen conditions such as parasite infections.   After every bowel movement and at bedtime, gently clean the  anal area. Bathe or use moistened tissue or soft wash cloth. You also may use pre-moistened anal cleansing pads or tissues made for cleaning up babies. Do not use soap. Gently pat the area dry.   Wear underwear made of cotton or with a cotton crotch. Do not wear tight fitting clothes or underwear that keep moisture in.   Avoid foods and beverages that may cause anal itching. Examples are beer, tea, coffee, milk, cola, tomatoes, citrus fruits, nuts, chocolate, and spicy foods.   Be sure you have enough fiber in your diet.   Do not use products that may irritate the anal skin. These include perfumed or colored toilet paper, deodorant sprays, and perfumed soaps.   Do not use any medication on the anal area unless advised. Some products may make itching worse.   It may take a few weeks for things to fully improve.  SEEK MEDICAL CARE IF:   The itching is not better in 3 to 4 days or is getting worse.   The skin around the anus becomes red or tender. This may be a sign of infection.   You have pain in the anus, especially with bowel movement.  SEEK IMMEDIATE MEDICAL CARE IF:  You have increasing pain in the anus or in the abdomen.   You have blood coming from the anus.   You have pus or other discharge from the anus.   You develop a fever.    Document Released: 03/05/2000 Document Revised: 11/18/2010 Document Reviewed: 03/12/2008 [facility] Patient Information 2012 [place], [place].

## 2011-05-14 ENCOUNTER — Encounter: Payer: Self-pay | Admitting: Internal Medicine

## 2011-05-24 ENCOUNTER — Encounter (INDEPENDENT_AMBULATORY_CARE_PROVIDER_SITE_OTHER): Payer: BC Managed Care – PPO | Admitting: Surgery

## 2011-05-24 ENCOUNTER — Encounter (INDEPENDENT_AMBULATORY_CARE_PROVIDER_SITE_OTHER): Payer: Self-pay | Admitting: Surgery

## 2011-05-24 ENCOUNTER — Encounter: Payer: Self-pay | Admitting: Internal Medicine

## 2011-05-24 ENCOUNTER — Ambulatory Visit (INDEPENDENT_AMBULATORY_CARE_PROVIDER_SITE_OTHER): Payer: BC Managed Care – PPO | Admitting: Surgery

## 2011-05-24 VITALS — BP 126/88 | HR 84 | Temp 97.0°F | Resp 24 | Ht 76.0 in | Wt 253.6 lb

## 2011-05-24 DIAGNOSIS — K6289 Other specified diseases of anus and rectum: Secondary | ICD-10-CM | POA: Insufficient documentation

## 2011-05-24 NOTE — Patient Instructions (Signed)
Follow up 1 month

## 2011-05-24 NOTE — Progress Notes (Signed)
The patient returns to day for followup of his anal irritation. He is increase his dietary fiber he stopped applying topical medication to the anal canal. He kepted area dry. He has less itching but is not back To baseline.  Exam: Anal canal with less irritation. New anterior anal fissure. Nontender.  Impression: Anal inflammation  Plan: Continue present care. Overall, it looks better he is due to see his gastroenterologist for colonoscopy. At this point in time I have nothing else to add.

## 2011-05-27 ENCOUNTER — Emergency Department (HOSPITAL_COMMUNITY): Payer: BC Managed Care – PPO

## 2011-05-27 ENCOUNTER — Other Ambulatory Visit: Payer: Self-pay

## 2011-05-27 ENCOUNTER — Observation Stay (HOSPITAL_COMMUNITY)
Admission: EM | Admit: 2011-05-27 | Discharge: 2011-05-28 | Disposition: A | Discharge status: Home / Self Care | Payer: BC Managed Care – PPO | Attending: Family Medicine | Admitting: Family Medicine

## 2011-05-27 ENCOUNTER — Telehealth: Payer: Self-pay

## 2011-05-27 ENCOUNTER — Encounter (HOSPITAL_COMMUNITY): Payer: Self-pay | Admitting: Nurse Practitioner

## 2011-05-27 DIAGNOSIS — I10 Essential (primary) hypertension: Secondary | ICD-10-CM | POA: Diagnosis present

## 2011-05-27 DIAGNOSIS — E785 Hyperlipidemia, unspecified: Secondary | ICD-10-CM | POA: Diagnosis present

## 2011-05-27 DIAGNOSIS — K219 Gastro-esophageal reflux disease without esophagitis: Secondary | ICD-10-CM | POA: Diagnosis present

## 2011-05-27 DIAGNOSIS — R079 Chest pain, unspecified: Principal | ICD-10-CM | POA: Insufficient documentation

## 2011-05-27 DIAGNOSIS — R0789 Other chest pain: Secondary | ICD-10-CM | POA: Diagnosis present

## 2011-05-27 HISTORY — DX: Shortness of breath: R06.02

## 2011-05-27 HISTORY — DX: Angina pectoris, unspecified: I20.9

## 2011-05-27 HISTORY — DX: Cardiac murmur, unspecified: R01.1

## 2011-05-27 LAB — COMPREHENSIVE METABOLIC PANEL
ALT: 35 U/L (ref 0–53)
AST: 21 U/L (ref 0–37)
Albumin: 3.9 g/dL (ref 3.5–5.2)
Alkaline Phosphatase: 65 U/L (ref 39–117)
BUN: 22 mg/dL (ref 6–23)
CO2: 30 mEq/L (ref 19–32)
Calcium: 9.4 mg/dL (ref 8.4–10.5)
Chloride: 99 mEq/L (ref 96–112)
Creatinine, Ser: 1.01 mg/dL (ref 0.50–1.35)
GFR calc Af Amer: >90 mL/min (ref >90)
GFR calc non Af Amer: 80 mL/min — ABNORMAL LOW (ref >90)
Glucose, Bld: 90 mg/dL (ref 70–99)
Potassium: 3.4 mEq/L — ABNORMAL LOW (ref 3.5–5.1)
Sodium: 141 mEq/L (ref 135–145)
Total Bilirubin: 0.4 mg/dL (ref 0.3–1.2)
Total Protein: 7.2 g/dL (ref 6.0–8.3)

## 2011-05-27 LAB — CBC
HCT: 43.2 % (ref 39.0–52.0)
HCT: 41.4 % (ref 39.0–52.0)
Hemoglobin: 15.3 g/dL (ref 13.0–17.0)
Hemoglobin: 14.6 g/dL (ref 13.0–17.0)
MCH: 30.3 pg (ref 26.0–34.0)
MCH: 30.1 pg (ref 26.0–34.0)
MCHC: 35.4 g/dL (ref 30.0–36.0)
MCHC: 35.3 g/dL (ref 30.0–36.0)
MCV: 85.5 fL (ref 78.0–100.0)
MCV: 85.4 fL (ref 78.0–100.0)
Platelets: 228 10*3/uL (ref 150–400)
Platelets: 238 10*3/uL (ref 150–400)
RBC: 5.05 MIL/uL (ref 4.22–5.81)
RBC: 4.85 MIL/uL (ref 4.22–5.81)
RDW: 12.7 % (ref 11.5–15.5)
RDW: 12.7 % (ref 11.5–15.5)
WBC: 8.3 10*3/uL (ref 4.0–10.5)
WBC: 7.7 10*3/uL (ref 4.0–10.5)

## 2011-05-27 LAB — POCT I-STAT TROPONIN I: Troponin i, poc: 0 ng/mL (ref 0.00–0.08)

## 2011-05-27 LAB — CARDIAC PANEL(CRET KIN+CKTOT+MB+TROPI)
CK, MB: 2.4 ng/mL (ref 0.3–4.0)
Relative Index: 2.2 (ref 0.0–2.5)
Total CK: 109 U/L (ref 7–232)
Troponin I: <0.3 ng/mL (ref <0.30)

## 2011-05-27 LAB — CREATININE, SERUM
Creatinine, Ser: 0.98 mg/dL (ref 0.50–1.35)
GFR calc Af Amer: >90 mL/min (ref >90)
GFR calc non Af Amer: 89 mL/min — ABNORMAL LOW (ref >90)

## 2011-05-27 MED ORDER — MORPHINE SULFATE 2 MG/ML IJ SOLN: 1.0000 mg | INTRAMUSCULAR | Status: DC | PRN

## 2011-05-27 MED ORDER — SODIUM CHLORIDE 0.9 % IV SOLN: 250.0000 mL | INTRAVENOUS | Status: DC | PRN

## 2011-05-27 MED ORDER — SODIUM CHLORIDE 0.9 % IJ SOLN: 3.0000 mL | Freq: Two times a day (BID) | INTRAMUSCULAR | Status: DC

## 2011-05-27 MED ORDER — ASPIRIN EC 325 MG PO TBEC
325.0000 mg | DELAYED_RELEASE_TABLET | Freq: Every day | ORAL | Status: DC
  Administered 2011-05-27 – 2011-05-28 (×2): 325 mg via ORAL
  Filled 2011-05-27 (×2): qty 1

## 2011-05-27 MED ORDER — PANTOPRAZOLE SODIUM 40 MG PO TBEC
40.0000 mg | DELAYED_RELEASE_TABLET | Freq: Two times a day (BID) | ORAL | Status: DC
  Administered 2011-05-27 – 2011-05-28 (×2): 40 mg via ORAL
  Filled 2011-05-27 (×2): qty 1

## 2011-05-27 MED ORDER — SODIUM CHLORIDE 0.9 % IJ SOLN: 3.0000 mL | INTRAMUSCULAR | Status: DC | PRN

## 2011-05-27 MED ORDER — POTASSIUM CHLORIDE CRYS ER 20 MEQ PO TBCR
40.0000 meq | EXTENDED_RELEASE_TABLET | Freq: Once | ORAL | Status: AC
  Administered 2011-05-27: 40 meq via ORAL
  Filled 2011-05-27: qty 2

## 2011-05-27 MED ORDER — SODIUM CHLORIDE 0.9 % IJ SOLN
3.0000 mL | Freq: Two times a day (BID) | INTRAMUSCULAR | Status: DC
  Administered 2011-05-27 – 2011-05-28 (×2): 3 mL via INTRAVENOUS

## 2011-05-27 MED ORDER — OMEGA-3-ACID ETHYL ESTERS 1 G PO CAPS
2.0000 g | ORAL_CAPSULE | Freq: Every day | ORAL | Status: DC
  Administered 2011-05-27 – 2011-05-28 (×2): 2 g via ORAL
  Filled 2011-05-27 (×2): qty 2

## 2011-05-27 MED ORDER — SIMVASTATIN 40 MG PO TABS
40.0000 mg | ORAL_TABLET | Freq: Every evening | ORAL | Status: DC
  Administered 2011-05-27: 40 mg via ORAL
  Filled 2011-05-27 (×2): qty 1

## 2011-05-27 MED ORDER — ATENOLOL-CHLORTHALIDONE 50-25 MG PO TABS: 1.0000 | ORAL_TABLET | Freq: Every day | ORAL | Status: DC

## 2011-05-27 MED ORDER — DOXAZOSIN MESYLATE 8 MG PO TABS
8.0000 mg | ORAL_TABLET | Freq: Every day | ORAL | Status: DC
  Administered 2011-05-27 – 2011-05-28 (×2): 8 mg via ORAL
  Filled 2011-05-27 (×2): qty 1

## 2011-05-27 MED ORDER — ENOXAPARIN SODIUM 40 MG/0.4ML ~~LOC~~ SOLN
40.0000 mg | SUBCUTANEOUS | Status: DC
  Administered 2011-05-27: 40 mg via SUBCUTANEOUS
  Filled 2011-05-27 (×2): qty 0.4

## 2011-05-27 MED ORDER — ASPIRIN 81 MG PO CHEW
324.0000 mg | CHEWABLE_TABLET | Freq: Once | ORAL | Status: AC
  Administered 2011-05-27: 324 mg via ORAL
  Filled 2011-05-27: qty 1

## 2011-05-27 MED ORDER — POTASSIUM CHLORIDE CRYS ER 20 MEQ PO TBCR
20.0000 meq | EXTENDED_RELEASE_TABLET | Freq: Every day | ORAL | Status: DC
  Administered 2011-05-27 – 2011-05-28 (×2): 20 meq via ORAL
  Filled 2011-05-27 (×2): qty 1

## 2011-05-27 MED ORDER — OMEGA-3 FATTY ACIDS 1000 MG PO CAPS: 2.0000 g | ORAL_CAPSULE | Freq: Every day | ORAL | Status: DC

## 2011-05-27 MED ORDER — ZOLPIDEM TARTRATE 5 MG PO TABS: 5.0000 mg | ORAL_TABLET | Freq: Every evening | ORAL | Status: DC | PRN

## 2011-05-27 MED ORDER — LORATADINE 10 MG PO TABS
10.0000 mg | ORAL_TABLET | Freq: Every day | ORAL | Status: DC
  Administered 2011-05-28: 10 mg via ORAL
  Filled 2011-05-27: qty 1

## 2011-05-27 MED ORDER — CHLORTHALIDONE 25 MG PO TABS
25.0000 mg | ORAL_TABLET | Freq: Every day | ORAL | Status: DC
  Administered 2011-05-27 – 2011-05-28 (×2): 25 mg via ORAL
  Filled 2011-05-27 (×2): qty 1

## 2011-05-27 MED ORDER — ATENOLOL 50 MG PO TABS
50.0000 mg | ORAL_TABLET | Freq: Every day | ORAL | Status: DC
  Administered 2011-05-27 – 2011-05-28 (×2): 50 mg via ORAL
  Filled 2011-05-27 (×2): qty 1

## 2011-05-27 NOTE — H&P (Signed)
History and Physical Examination  Date: 05/27/2011  Patient name: James [name] Medical record number: [id number] Date of birth: [birth year] Age: 61 y.o. Gender: male PCP: Evette Georges, MD, MD  Attending physician: Cleora Fleet, MD  Chief Complaint:  Chief Complaint  Patient presents with  . Chest Pain     History of Present Illness: James [name] is an 61 y.o. male who has been experiencing atypical anterior chest wall pain for 2 weeks that he had initially believed was acid reflux.  He had been taking nexium tablets with only minimal relief.  He says that he has pain after taking a deep breath and exhaling he will feel the slight pain.  These pains come intermittently at rest and not exacerbated by more vigorous physical activities.  He told his wife about this and she insisted that he come to the ER for evaluation.   He has a history of hypertension, hyperlipidemia and has had GERD and SOB in the past.   Past Medical History Past Medical History  Diagnosis Date  . GERD (gastroesophageal reflux disease)   . Hyperlipidemia   . Hypertension   . Hemorrhoids   . Heart murmur   . Shortness of breath   . Angina     Past Surgical History Past Surgical History  Procedure Date  . Appendectomy   . Testicle torsion reduction   . Cervical disc surgery   . Lumbar laminectomy   . Eye surgery 09&12/2009    Home Meds: Prior to Admission medications   Medication Sig Start Date End Date Taking? Authorizing Provider  aspirin 81 MG tablet Take 81 mg by mouth daily.     Yes Historical Provider, MD  atenolol-chlorthalidone (TENORETIC) 50-25 MG per tablet Take 1 tablet by mouth daily.   Yes Historical Provider, MD  Cinnamon 500 MG TABS Take 500 mg by mouth daily.   Yes Historical Provider, MD  doxazosin (CARDURA) 8 MG tablet Take 8 mg by mouth daily.   Yes Historical Provider, MD  esomeprazole (NEXIUM) 40 MG capsule Take 40 mg by mouth daily.   Yes Historical Provider, MD  fish  oil-omega-3 fatty acids 1000 MG capsule Take 2 g by mouth daily.   Yes Historical Provider, MD  loratadine (CLARITIN) 10 MG tablet Take 10 mg by mouth daily.   Yes Historical Provider, MD  potassium chloride SA (K-DUR,KLOR-CON) 20 MEQ tablet Take 20 mEq by mouth daily.   Yes Historical Provider, MD  simvastatin (ZOCOR) 40 MG tablet Take 40 mg by mouth every evening.   Yes Historical Provider, MD    Allergies: Hydrocodone  Social History:  History   Social History  . Marital Status: Married    Spouse Name: N/A    Number of Children: N/A  . Years of Education: N/A   Occupational History  . Not on file.   Social History Main Topics  . Smoking status: Never Smoker   . Smokeless tobacco: Never Used  . Alcohol Use: Yes     1 bottle wine a week  . Drug Use: No  . Sexually Active: Yes   Other Topics Concern  . 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 Cancer Mother     skin cancer/melanoma  . Hypertension Father   . Heart disease Father     Review of Systems: Pertinent  items are noted in HPI. All other systems reviewed and reported as negative.   Physical Exam: Blood pressure 137/76, pulse 69, temperature 98 F (36.7 C), temperature source Oral, resp. rate 18, height 6\' 4"  (1.93 m), weight 115.667 kg (255 lb), SpO2 97.00%. BP 137/76  Pulse 69  Temp(Src) 98 F (36.7 C) (Oral)  Resp 18  Ht 6\' 4"  (1.93 m)  Wt 115.667 kg (255 lb)  BMI 31.04 kg/m2  SpO2 97%  General Appearance:    Alert, cooperative, no distress, appears stated age  Head:    Normocephalic, without obvious abnormality, atraumatic  Eyes:    PERRL, conjunctiva/corneas clear, EOM's intact, fundi    benign, both eyes          Nose:   Nares normal, septum midline, mucosa normal, no drainage    or sinus tenderness  Throat:   Lips, mucosa, and tongue normal; teeth  and gums normal  Neck:   Supple, symmetrical, trachea midline, no adenopathy;       thyroid:  No enlargement/tenderness/nodules; no carotid   bruit or JVD     Lungs:     Clear to auscultation bilaterally, respirations unlabored  Chest wall:    No tenderness or deformity  Heart:    Regular rate and rhythm, S1 and S2 normal, no murmur, rub   or gallop  Abdomen:     Soft, non-tender, bowel sounds active all four quadrants,    no masses, no organomegaly        Extremities:   Extremities normal, atraumatic, no cyanosis or edema  Pulses:   2+ and symmetric all extremities  Skin:   Skin color, texture, turgor normal, no rashes or lesions     Neurologic:   CNII-XII intact. Normal strength, sensation and reflexes      throughout    Lab  And Imaging results:  Results for orders placed during the hospital encounter of 05/27/11 (from the past 24 hour(s))  CBC     Status: Normal   Collection Time   05/27/11  3:59 PM      Component Value Range   WBC 8.3  4.0 - 10.5 (K/uL)   RBC 5.05  4.22 - 5.81 (MIL/uL)   Hemoglobin 15.3  13.0 - 17.0 (g/dL)   HCT 40.9  81.1 - 91.4 (%)   MCV 85.5  78.0 - 100.0 (fL)   MCH 30.3  26.0 - 34.0 (pg)   MCHC 35.4  30.0 - 36.0 (g/dL)   RDW 78.2  95.6 - 21.3 (%)   Platelets 228  150 - 400 (K/uL)  COMPREHENSIVE METABOLIC PANEL     Status: Abnormal   Collection Time   05/27/11  3:59 PM      Component Value Range   Sodium 141  135 - 145 (mEq/L)   Potassium 3.4 (*) 3.5 - 5.1 (mEq/L)   Chloride 99  96 - 112 (mEq/L)   CO2 30  19 - 32 (mEq/L)   Glucose, Bld 90  70 - 99 (mg/dL)   BUN 22  6 - 23 (mg/dL)   Creatinine, Ser 0.86  0.50 - 1.35 (mg/dL)   Calcium 9.4  8.4 - 57.8 (mg/dL)   Total Protein 7.2  6.0 - 8.3 (g/dL)   Albumin 3.9  3.5 - 5.2 (g/dL)   AST 21  0 - 37 (U/L)   ALT 35  0 - 53 (U/L)   Alkaline Phosphatase 65  39 - 117 (U/L)   Total Bilirubin 0.4  0.3 - 1.2 (  mg/dL)   GFR calc non Af Amer 80 (*) >90 (mL/min)   GFR calc Af Amer >90  >90 (mL/min)  POCT  I-STAT TROPONIN I     Status: Normal   Collection Time   05/27/11  4:13 PM      Component Value Range   Troponin i, poc 0.00  0.00 - 0.08 (ng/mL)   Comment 3            EKG Results:  Orders placed during the hospital encounter of 05/27/11  . ED EKG  . ED EKG     Impression  Active Problems:  HYPERLIPIDEMIA  HYPERTENSION  GERD Hypokalemia BPH Atypical Chest Pain  Plan  Admit for observation, the cardiologist was called in the ER Dr. Cammy Copa and will evaluate patient.  Will cycle cardiac enzymes and have to replace potassium.  Aggressively treat GERD with PPI therapy and follow on telemetry.  Check fasting lipid profile.  Provide NTG, oxygen, morphine and aspirin.   Standley Dakins MD [facility] [place], [place] [phone] 05/27/2011, 7:12 PM

## 2011-05-27 NOTE — ED Provider Notes (Signed)
History     CSN: [id number]  Arrival date & time 05/27/11  1348   First MD Initiated Contact with Patient 05/27/11 1545      Chief Complaint  Patient presents with  . Chest Pain    (Consider location/radiation/quality/duration/timing/severity/associated sxs/prior treatment) HPI Patient is a 61 year old male who presents today complaining of 2 weeks of intermittent episodes of left-sided chest discomfort. Currently he says that his pain is only 2/10. He had initially sounds that this was reflux and had tried treating himself at home and Nexium. He did not have resolution of his symptoms with this. Patient has not noted any variation in his symptoms with exertion. He does feel that slightly with worse on acceleration. He does not note any exacerbation with inhalation. Patient has no history of DVT, PE, or CAD. He has no history of diabetes. Patient does have history of hypertension and hyperlipidemia as well as a father who had a heart attack at age of 10. The patient had a stress test over 10 years ago that was negative. He is not a smoker. Patient says that his pain can get as intense as 5/10 pain. The longest episodes of pain last has been 3-4 hours. He came today as he discussed this with his wife insisted he come to the hospital to be evaluated. He denies any recent cough or fevers. His symptoms do not feel like reflux at this time. There are no other associated or modifying factors. Past Medical History  Diagnosis Date  . GERD (gastroesophageal reflux disease)   . Hyperlipidemia   . Hypertension   . Hemorrhoids   . Heart murmur     Past Surgical History  Procedure Date  . Appendectomy   . Testicle torsion reduction   . Cervical disc surgery   . Lumbar laminectomy   . Eye surgery 09&12/2009    Family History  Problem Relation Age of Onset  . Cancer Mother     skin cancer/melanoma  . Hypertension Father   . Heart disease Father     History  Substance Use Topics  .  Smoking status: Never Smoker   . Smokeless tobacco: Not on file  . Alcohol Use: Yes     1 bottle wine a week      Review of Systems  Constitutional: Negative.   HENT: Negative.   Eyes: Negative.   Respiratory: Negative.   Cardiovascular: Positive for chest pain.  Gastrointestinal: Negative.   Genitourinary: Negative.   Musculoskeletal: Negative.   Skin: Negative.   Neurological: Negative.   Hematological: Negative.   Psychiatric/Behavioral: Negative.   All other systems reviewed and are negative.    Allergies  Hydrocodone  Home Medications   Current Outpatient Rx  Name Route Sig Dispense Refill  . ASPIRIN 81 MG PO TABS Oral Take 81 mg by mouth daily.      . ATENOLOL-CHLORTHALIDONE 50-25 MG PO TABS Oral Take 1 tablet by mouth daily.    Marland Kitchen CINNAMON 500 MG PO TABS Oral Take 500 mg by mouth daily.    Marland Kitchen DOXAZOSIN MESYLATE 8 MG PO TABS Oral Take 8 mg by mouth daily.    Marland Kitchen ESOMEPRAZOLE MAGNESIUM 40 MG PO CPDR Oral Take 40 mg by mouth daily.    . OMEGA-3 FATTY ACIDS 1000 MG PO CAPS Oral Take 2 g by mouth daily.    Marland Kitchen LORATADINE 10 MG PO TABS Oral Take 10 mg by mouth daily.    Marland Kitchen POTASSIUM CHLORIDE CRYS ER 20 MEQ PO TBCR  Oral Take 20 mEq by mouth daily.    Marland Kitchen SIMVASTATIN 40 MG PO TABS Oral Take 40 mg by mouth every evening.      BP 127/71  Pulse 69  Temp(Src) 98.1 F (36.7 C) (Oral)  Resp 16  Ht 6\' 4"  (1.93 m)  Wt 255 lb (115.667 kg)  BMI 31.04 kg/m2  SpO2 98%  Physical Exam  Nursing note and vitals reviewed. GEN: Well-developed, well-nourished male in no distress HEENT: Atraumatic, normocephalic. Oropharynx clear without erythema EYES: PERRLA BL, no scleral icterus. NECK: Trachea midline, no meningismus CV: regular rate and rhythm. No murmurs, rubs, or gallops PULM: No respiratory distress.  No crackles, wheezes, or rales. GI: soft, non-tender. No guarding, rebound, or tenderness. + bowel sounds  Neuro: cranial nerves 2-12 intact, no abnormalities of strength or  sensation, A and O x 3 MSK: Patient moves all 4 extremities symmetrically, no deformity, edema, or injury noted Psych: no abnormality of mood   ED Course  Procedures (including critical care time)   Date: 05/27/2011  Rate: 84  Rhythm: normal sinus rhythm  QRS Axis: normal  Intervals: normal  ST/T Wave abnormalities: ST depressions laterally  Conduction Disutrbances:none  Narrative Interpretation: Patient with 1 mm ST depression in leads V4 through V5  Old EKG Reviewed: changes noted   Labs Reviewed  COMPREHENSIVE METABOLIC PANEL - Abnormal; Notable for the following:    Potassium 3.4 (*)    GFR calc non Af Amer 80 (*)    All other components within normal limits  CREATININE, SERUM - Abnormal; Notable for the following:    GFR calc non Af Amer 89 (*)    All other components within normal limits  CBC  POCT I-STAT TROPONIN I  CBC  CARDIAC PANEL(CRET KIN+CKTOT+MB+TROPI)  COMPREHENSIVE METABOLIC PANEL  CBC  PROTIME-INR  CARDIAC PANEL(CRET KIN+CKTOT+MB+TROPI)  LIPID PANEL  CARDIAC PANEL(CRET KIN+CKTOT+MB+TROPI)  CARDIAC PANEL(CRET KIN+CKTOT+MB+TROPI)   Dg Chest 2 View  05/27/2011  *RADIOLOGY REPORT*  Clinical Data: 61 year old male with chest pain, left extremity numbness.  CHEST - 2 VIEW  Comparison: 11/24/2010.  Findings: Stable, normal lung volumes.  Cardiac size and mediastinal contours are within normal limits.  Visualized tracheal air column is within normal limits.  The lungs are clear.  No pneumothorax or effusion. No acute osseous abnormality identified.  IMPRESSION: No acute cardiopulmonary abnormality.  Original Report Authenticated By: Harley Hallmark, M.D.     1. Chest pain       MDM  Patient was evaluated by myself. EKG that had been performed in triage and signed off at that time showed 1 mm of ST segment depression in leads V4 through V5. Patient had EKG at 1352. He was transferred back to the acute care area after my shift began at 3 PM. Patient is given  aspirin 324 mg by mouth. He at only slight discomfort in his chest at that time. Patient had chest x-ray that was unremarkable. Laboratory workup for possible ACS was performed. Patient presentation was inconsistent with PE. Chest x-ray was unremarkable. Initial troponin and CBC were unremarkable.  Patient had occasional 2 to three-minute episodes of discomfort but no severe pain. He remained hemodynamically stable. Workup here was negative. I discussed the patient's case with Dr. Patty Sermons from cardiology given his EKG changes. Based on the patient's presentation Dr. Patty Sermons still felt that admission to hospitalist was appropriate. I agreed with his assessment and thinks him for his consult. Patient will be admitted to [facility] for overnight rule out.  He likely still could have outpatient stress testing following that should he remain stable. Patient was admitted for further management.       Cyndra Numbers, MD 05/28/11 0010

## 2011-05-27 NOTE — ED Notes (Signed)
Pt c/o cp, increased with exhaling, over past week. Felt like heartburn at first but has been constant and not relieved by anything. Also c/o feeling numbness in chest and legs at times

## 2011-05-27 NOTE — Telephone Encounter (Signed)
Pt called and states he has been having chest pains for the last 2 to [redacted] weeks along with SOB, and numbness on left leg.  Pt states he called cardiology and was told that the first appt he could get would be with Dr. Excell Seltzer on April 2nd 2013.  Pt states he thought the chest pains were indigestion but now he is getting concerned.    Per Dr. Tawanna Cooler pt advised to go to [facility] ER.  Pt states he understands and is going to [facility].

## 2011-05-27 NOTE — Progress Notes (Signed)
Per DM request, left msg with cardiology office for them to call pt with a stress test appointment.

## 2011-05-27 NOTE — ED Notes (Signed)
2008-01 Ready 

## 2011-05-28 ENCOUNTER — Other Ambulatory Visit: Payer: Self-pay

## 2011-05-28 LAB — CBC
HCT: 40.1 % (ref 39.0–52.0)
Hemoglobin: 14 g/dL (ref 13.0–17.0)
MCH: 29.9 pg (ref 26.0–34.0)
MCHC: 34.9 g/dL (ref 30.0–36.0)
MCV: 85.5 fL (ref 78.0–100.0)
Platelets: 214 10*3/uL (ref 150–400)
RBC: 4.69 MIL/uL (ref 4.22–5.81)
RDW: 12.8 % (ref 11.5–15.5)
WBC: 7.6 10*3/uL (ref 4.0–10.5)

## 2011-05-28 LAB — COMPREHENSIVE METABOLIC PANEL
ALT: 30 U/L (ref 0–53)
AST: 19 U/L (ref 0–37)
Albumin: 3.5 g/dL (ref 3.5–5.2)
Alkaline Phosphatase: 55 U/L (ref 39–117)
BUN: 17 mg/dL (ref 6–23)
CO2: 29 mEq/L (ref 19–32)
Calcium: 8.9 mg/dL (ref 8.4–10.5)
Chloride: 100 mEq/L (ref 96–112)
Creatinine, Ser: 1.03 mg/dL (ref 0.50–1.35)
GFR calc Af Amer: >90 mL/min (ref >90)
GFR calc non Af Amer: 78 mL/min — ABNORMAL LOW (ref >90)
Glucose, Bld: 100 mg/dL — ABNORMAL HIGH (ref 70–99)
Potassium: 3.3 mEq/L — ABNORMAL LOW (ref 3.5–5.1)
Sodium: 140 mEq/L (ref 135–145)
Total Bilirubin: 0.6 mg/dL (ref 0.3–1.2)
Total Protein: 6.4 g/dL (ref 6.0–8.3)

## 2011-05-28 LAB — LIPID PANEL
Cholesterol: 153 mg/dL (ref 0–200)
HDL: 43 mg/dL (ref >39)
LDL Cholesterol: 73 mg/dL (ref 0–99)
Total CHOL/HDL Ratio: 3.6 RATIO
Triglycerides: 183 mg/dL — ABNORMAL HIGH (ref <150)
VLDL: 37 mg/dL (ref 0–40)

## 2011-05-28 LAB — PROTIME-INR
INR: 0.99 (ref 0.00–1.49)
Prothrombin Time: 13.3 seconds (ref 11.6–15.2)

## 2011-05-28 LAB — CARDIAC PANEL(CRET KIN+CKTOT+MB+TROPI)
CK, MB: 2.3 ng/mL (ref 0.3–4.0)
Relative Index: 2.3 (ref 0.0–2.5)
Total CK: 101 U/L (ref 7–232)
Troponin I: <0.3 ng/mL (ref <0.30)

## 2011-05-28 NOTE — Discharge Summary (Signed)
HOSPITAL DISCHARGE SUMMARY   @n   James [name], 61 y.o., DOB November 08, 1950, MRN [id number]  Admission date: 05/27/2011 Discharge Date: 05/28/2011  Primary MD TODD,JEFFREY Freida Busman, MD, MD  Admitting Physician No admitting provider for patient encounter.  Admission Diagnosis  Chest pain [786.5] CP  Discharge Diagnoses:   Active Hospital Problems  Diagnoses Date Noted   . HYPERTENSION 04/07/2007   . GERD 04/07/2007   . HYPERLIPIDEMIA 04/07/2007     Resolved Hospital Problems  Diagnoses Date Noted Date Resolved  . Atypical chest pain 05/27/2011 05/28/2011    Past Medical History  Diagnosis Date  . GERD (gastroesophageal reflux disease)   . Hyperlipidemia   . Hypertension   . Hemorrhoids   . Heart murmur   . Shortness of breath   . Angina     Past Surgical History  Procedure Date  . Appendectomy   . Testicle torsion reduction   . Cervical disc surgery   . Lumbar laminectomy   . Eye surgery 09&12/2009    Consults  [facility] Course See H&P, Labs, Consult and Test reports for all details. In brief, this patient was admitted for atypical chest pain.  He was evaluated by [facility].  He was ruled out for MI by enzymes.   [facility] recommended outpatient stress testing.  They will call patient to arrange stress test.  Pt to have close follow up with his PCP.     Active Hospital Problems  Diagnoses Date Noted   . HYPERTENSION 04/07/2007   . GERD 04/07/2007   . HYPERLIPIDEMIA 04/07/2007     Resolved Hospital Problems  Diagnoses Date Noted Date Resolved  . Atypical chest pain 05/27/2011 05/28/2011     Today's Assessment:   Subjective:   James [name]  Pt says that pain is completely resolved now.  No complaints.   Objective:   Blood pressure 106/73, pulse 57, temperature 98.6 F (37 C), temperature source Oral, resp. rate 18, height 6\' 4"  (1.93 m), weight 83.779 kg (184 lb 11.2 oz), SpO2 97.00%.  Intake/Output Summary (Last 24 hours) at 05/28/11  1018 Last data filed at 05/28/11 0657  Gross per 24 hour  Intake    240 ml  Output   1100 ml  Net   -860 ml    Exam General - awake, alert, NAD HEENT - NCAT, MMM Lungs - CTA bilateral CV - normal s1, s2 sounds ABD - soft, nontender, nondistended EXT - no edema     Lab Results  Component Value Date   WBC 7.6 05/28/2011   HGB 14.0 05/28/2011   HCT 40.1 05/28/2011   PLT 214 05/28/2011   LYMPHOPCT 25.1 06/19/2010   MONOPCT 8.1 06/19/2010   EOSPCT 5.0 06/19/2010   BASOPCT 0.4 06/19/2010   CMP: Lab Results  Component Value Date   NA 140 05/28/2011   K 3.3* 05/28/2011   CL 100 05/28/2011   CO2 29 05/28/2011   BUN 17 05/28/2011   CREATININE 1.03 05/28/2011   PROT 6.4 05/28/2011   ALBUMIN 3.5 05/28/2011   BILITOT 0.6 05/28/2011   ALKPHOS 55 05/28/2011   AST 19 05/28/2011   ALT 30 05/28/2011  .  Discharge Instructions     Get your outpatient stress test scheduled with [facility].  They will be calling you to set it up.    DISCHARGE MEDICATION: Medication List  As of 05/28/2011 10:18 AM   TAKE these medications         aspirin 81 MG tablet  Take 81 mg by mouth daily.      atenolol-chlorthalidone 50-25 MG per tablet   Commonly known as: TENORETIC   Take 1 tablet by mouth daily.      Cinnamon 500 MG Tabs   Take 500 mg by mouth daily.      doxazosin 8 MG tablet   Commonly known as: CARDURA   Take 8 mg by mouth daily.      esomeprazole 40 MG capsule   Commonly known as: NEXIUM   Take 40 mg by mouth daily.      fish oil-omega-3 fatty acids 1000 MG capsule   Take 2 g by mouth daily.      loratadine 10 MG tablet   Commonly known as: CLARITIN   Take 10 mg by mouth daily.      potassium chloride SA 20 MEQ tablet   Commonly known as: K-DUR,KLOR-CON   Take 20 mEq by mouth daily.      simvastatin 40 MG tablet   Commonly known as: ZOCOR   Take 40 mg by mouth every evening.            Disposition and Follow-up: Discharge Orders    Future Appointments: Provider: Department: Dept  Phone: Center:   06/28/2011 11:00 AM [facility] Rm [facility] [phone] [facility]   07/07/2011 8:30 AM Hart Carwin, MD [facility] [phone] [facility]   07/30/2011 8:30 AM [facility] [phone] [facility]   08/05/2011 10:30 AM Evette Georges, MD [facility] [phone] [facility]     Future Orders Please Complete By Expires   Diet - low sodium heart healthy      Increase activity slowly      Discharge instructions      Comments:   Please follow up for outpatient stress test with [facility].  They will call you with appointment.  Return if symptoms recur, worsen or new changes develop.     Call MD for:  severe uncontrolled pain      Call MD for:  difficulty breathing, headache or visual disturbances      Call MD for:  extreme fatigue      Discontinue IV        Follow-up Information    Follow up with TODD,JEFFREY ALLEN, MD in 3 weeks.      Follow up with [facility] for stress test in 1 week.        The risks, benefits, and possible side effects of all treatments and tests were explained to the patient.  The patient verbalized understanding.  The importance of close follow up with the primary care medical provider was explained clearly to the patient.  The patient verbalized understanding.  The patient was given instructions to return if symptoms recur, worsen or new changes develop.  The patient verbalized understanding.   Cleora Fleet, MD, CDE, FAAFP [facility] [place], [place]  [phone]  Total Time spent reviewing critical document, reviewing this patient's comprehensive hospitalization, arranging follow up and coordination of care, reviewing data and todays exam greater than 35 minutes.   Signed: Jaydrien Wassenaar 05/28/2011 10:18 AM

## 2011-06-28 ENCOUNTER — Telehealth: Payer: Self-pay | Admitting: *Deleted

## 2011-06-28 ENCOUNTER — Ambulatory Visit (AMBULATORY_SURGERY_CENTER): Payer: BC Managed Care – PPO | Admitting: *Deleted

## 2011-06-28 VITALS — Ht 76.0 in | Wt 255.4 lb

## 2011-06-28 DIAGNOSIS — Z1211 Encounter for screening for malignant neoplasm of colon: Secondary | ICD-10-CM

## 2011-06-28 MED ORDER — PEG-KCL-NACL-NASULF-NA ASC-C 100 G PO SOLR: ORAL | Status: DC

## 2011-06-28 NOTE — Telephone Encounter (Signed)
Patient is for screening colonoscopy on 07-07-11. He has GERD treated with Nexium daily by Dr.Todd. His last EGD was 1997, findings hiatal hernia,& esophageal stricture. Pt c/o reflux," throat burning and indigestion" and requests EGD. Chart on your desk for review. Can I schedule direct EGD or does patient need office visit? Please advise,thanks.

## 2011-06-28 NOTE — Telephone Encounter (Signed)
OK to schedule direct EGD/colon without having to rechedule the pt.

## 2011-06-28 NOTE — Telephone Encounter (Signed)
Noted. Scheduled patient for EGD/Colon 08/10/11 at 3pm. Notified patient. Pt understands to change prep times.

## 2011-07-01 ENCOUNTER — Telehealth: Payer: Self-pay | Admitting: Family Medicine

## 2011-07-01 DIAGNOSIS — R0600 Dyspnea, unspecified: Secondary | ICD-10-CM

## 2011-07-01 DIAGNOSIS — E785 Hyperlipidemia, unspecified: Secondary | ICD-10-CM

## 2011-07-01 DIAGNOSIS — K219 Gastro-esophageal reflux disease without esophagitis: Secondary | ICD-10-CM

## 2011-07-01 DIAGNOSIS — I10 Essential (primary) hypertension: Secondary | ICD-10-CM

## 2011-07-01 NOTE — Telephone Encounter (Signed)
Please set him up for a cardiology consult with Dr. Parke Simmers

## 2011-07-01 NOTE — Telephone Encounter (Signed)
Pt called and said that he went to ER re: chest pain and test were done. Recommended that pt get a Stress Test Ordered. Pls call.

## 2011-07-01 NOTE — Telephone Encounter (Signed)
Patient is aware and referral request sent 

## 2011-07-06 ENCOUNTER — Encounter: Payer: Self-pay | Admitting: Cardiology

## 2011-07-06 ENCOUNTER — Ambulatory Visit (INDEPENDENT_AMBULATORY_CARE_PROVIDER_SITE_OTHER): Payer: BC Managed Care – PPO | Admitting: Cardiology

## 2011-07-06 VITALS — BP 110/82 | HR 65 | Ht 76.0 in | Wt 254.1 lb

## 2011-07-06 DIAGNOSIS — R079 Chest pain, unspecified: Secondary | ICD-10-CM | POA: Insufficient documentation

## 2011-07-06 DIAGNOSIS — I10 Essential (primary) hypertension: Secondary | ICD-10-CM

## 2011-07-06 DIAGNOSIS — E785 Hyperlipidemia, unspecified: Secondary | ICD-10-CM

## 2011-07-06 NOTE — Patient Instructions (Signed)
Your physician has requested that you have en exercise stress myoview. For further information please visit [email or web address] Please follow instruction sheet, as given.   Follow-up appointment with Dr Shirlee Latch will be made based on the results of the [facility].

## 2011-07-06 NOTE — Progress Notes (Signed)
PCP: Dr. Tawanna Cooler  61 yo with history of HTN and GERD presents for evaluation of chest pain.  Starting in 2/13, patient had about 3 weeks where he would have chest pain every other day or so.  The pain was substernal tightness.  He would often wake up with it.  It would last 3-4 hours at a time.  It was not exertional; he could identify no particular trigger.  He has a long history of reflux but was taking his Nexium and the pain did not feel like his typical reflux.  He told Dr. Tawanna Cooler about the symptoms and was sent to the ER in 3/13.  He was kept overnight and ruled out for MI.  ECG was unremarkable.  He was sent home the next day.  Since then, he had 1-2 episodes of similar chest pain when he first left the hospital but has had no chest pain for the last 2-3 weeks.  He has no problems walking 1 mile or climbing a flight of steps.  He gets short of breath when tries to jog or with other heavy exertion.  He is not a smoker.  He does have high blood pressure, hyperlipidemia, and family history of CAD.   Labs (3/13): K 3.3, creatinine 1.03, LDL 73, HDL 43  ECG: NSR, normal  PMH: 1. HTN 2. GERD with esophagitis 3. Hyperlipidemia  SH: Nonsmoker.  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  Married, lives in [place].    FH: Father with MI at 62.  Father had had history of polio.  Mother and sister healthy.   ROS: All systems reviewed and negative except as per HPI.   Current Outpatient Prescriptions  Medication Sig Dispense Refill  . aspirin 81 MG tablet Take 81 mg by mouth daily.        Marland Kitchen atenolol-chlorthalidone (TENORETIC) 50-25 MG per tablet Take 1 tablet by mouth daily.      . Cinnamon 500 MG TABS Take 500 mg by mouth daily.      Marland Kitchen doxazosin (CARDURA) 8 MG tablet Take 8 mg by mouth daily.      Marland Kitchen esomeprazole (NEXIUM) 40 MG capsule Take 40 mg by mouth daily.      . fish oil-omega-3 fatty acids 1000 MG capsule Take 2 g by mouth daily.      Marland Kitchen loratadine (CLARITIN) 10 MG tablet Take 10 mg by mouth daily.       . potassium chloride SA (K-DUR,KLOR-CON) 20 MEQ tablet Take 20 mEq by mouth daily.      . simvastatin (ZOCOR) 40 MG tablet Take 40 mg by mouth every evening.        BP 110/82  Pulse 65  Ht 6\' 4"  (1.93 m)  Wt 254 lb 1.9 oz (115.268 kg)  BMI 30.93 kg/m2 General: NAD, overweight Neck: No JVD, no thyromegaly or thyroid nodule.  Lungs: Clear to auscultation bilaterally with normal respiratory effort. CV: Nondisplaced PMI.  Heart regular S1/S2, no S3/S4, no murmur.  No peripheral edema.  No carotid bruit.  Normal pedal pulses.  Abdomen: Soft, nontender, no hepatosplenomegaly, no distention.  Skin: Intact without lesions or rashes.  Neurologic: Alert and oriented x 3.  Psych: Normal affect. Extremities: No clubbing or cyanosis.  HEENT: Normal.

## 2011-07-06 NOTE — Assessment & Plan Note (Signed)
Patient was having prolonged episodes of chest pain (3-4 hours at a time) about 3-4 weeks ago.  Pain was not exertional (atypica) but did not feel like his GERD.  He was kept overnight in the hospital with negative cardiac enzymes, plan was for outpatient myoview.  He has actually not had chest pain for the last couple of weeks.  Risk factors include HTN, hyperlipidemia, age/gender, and premature family history of CAD in his father.  I will set him up for an ETT-myoview this week for risk stratification.  Continue ASA 81.

## 2011-07-06 NOTE — Assessment & Plan Note (Signed)
BP appears to be under control on his current meds.

## 2011-07-06 NOTE — Assessment & Plan Note (Signed)
Excellent lipids when checked in 3/13.  He is taking simvastatin.

## 2011-07-07 ENCOUNTER — Ambulatory Visit (HOSPITAL_COMMUNITY): Payer: BC Managed Care – PPO | Attending: Cardiology | Admitting: Radiology

## 2011-07-07 ENCOUNTER — Encounter: Payer: BC Managed Care – PPO | Admitting: Internal Medicine

## 2011-07-07 VITALS — BP 120/80 | Ht 76.0 in | Wt 250.0 lb

## 2011-07-07 DIAGNOSIS — E785 Hyperlipidemia, unspecified: Secondary | ICD-10-CM

## 2011-07-07 DIAGNOSIS — E663 Overweight: Secondary | ICD-10-CM | POA: Insufficient documentation

## 2011-07-07 DIAGNOSIS — R0600 Dyspnea, unspecified: Secondary | ICD-10-CM

## 2011-07-07 DIAGNOSIS — R0602 Shortness of breath: Secondary | ICD-10-CM

## 2011-07-07 DIAGNOSIS — R0609 Other forms of dyspnea: Secondary | ICD-10-CM | POA: Insufficient documentation

## 2011-07-07 DIAGNOSIS — I10 Essential (primary) hypertension: Secondary | ICD-10-CM

## 2011-07-07 DIAGNOSIS — I4949 Other premature depolarization: Secondary | ICD-10-CM

## 2011-07-07 DIAGNOSIS — R0989 Other specified symptoms and signs involving the circulatory and respiratory systems: Secondary | ICD-10-CM | POA: Insufficient documentation

## 2011-07-07 DIAGNOSIS — R0789 Other chest pain: Secondary | ICD-10-CM | POA: Insufficient documentation

## 2011-07-07 DIAGNOSIS — R079 Chest pain, unspecified: Secondary | ICD-10-CM

## 2011-07-07 DIAGNOSIS — Z8249 Family history of ischemic heart disease and other diseases of the circulatory system: Secondary | ICD-10-CM | POA: Insufficient documentation

## 2011-07-07 MED ORDER — TECHNETIUM TC 99M TETROFOSMIN IV KIT
11.0000 | PACK | Freq: Once | INTRAVENOUS | Status: AC | PRN
  Administered 2011-07-07: 11 via INTRAVENOUS

## 2011-07-07 MED ORDER — TECHNETIUM TC 99M TETROFOSMIN IV KIT
32.8000 | PACK | Freq: Once | INTRAVENOUS | Status: AC | PRN
  Administered 2011-07-07: 32.8 via INTRAVENOUS

## 2011-07-07 NOTE — Progress Notes (Addendum)
[facility] SITE 3 NUCLEAR MED [address] [place] [address] [phone]  Cardiology Nuclear Med Study  James [name] is a 61 y.o. male     MRN : [id number]     DOB: 25-Oct-1950  Procedure Date: 07/07/2011  Nuclear Med Background Indication for Stress Test:  Evaluation for Ischemia History:  ~15 yrs ago OZH:YQMVHQ per patient; 05/27/11 [facility] with Chest Pain, MI R/O Cardiac Risk Factors: Family History - CAD, Hypertension, Lipids and Overweight  Symptoms:  Chest Pain/Tightness (last episode of chest discomfort:none since discharge) and DOE   Nuclear Pre-Procedure Caffeine/Decaff Intake:  None NPO After: 8:00pm   Lungs:  Clear. IV 0.9% NS with Angio Cath:  20g  IV Site: R Antecubital  IV Started by:  Bonnita Levan, RN  Chest Size (in):  46 Cup Size: n/a  Height: 6\' 4"  (1.93 m)  Weight:  250 lb (113.399 kg)  BMI:  Body mass index is 30.43 kg/(m^2). Tech Comments: Tenoretic held x 22 hours, no medications taken this am.    Nuclear Med Study 1 or 2 day study: 1 day  Stress Test Type:  Stress  Reading MD: Charlton Haws, MD  Order Authorizing Provider:  Marca Ancona, MD  Resting Radionuclide: Technetium 55m Tetrofosmin  Resting Radionuclide Dose: 10.5 mCi   Stress Radionuclide:  Technetium 12m Tetrofosmin  Stress Radionuclide Dose: 32.8 mCi           Stress Protocol Rest HR: 63 Stress HR: 150  Rest BP: Sitting 120/80; Standing:99/70 Stress BP: 164/64  Exercise Time (min): 6:30 METS: 7.7   Predicted Max HR: 162 bpm % Max HR: 92.59 bpm Rate Pressure Product: 46962   Dose of Adenosine (mg):  n/a Dose of Lexiscan: n/a mg  Dose of Atropine (mg): n/a Dose of Dobutamine: n/a mcg/kg/min (at max HR)  Stress Test Technologist: Smiley Houseman, CMA-N  Nuclear Technologist:  Domenic Polite, CNMT     Rest Procedure:  Myocardial perfusion imaging was performed at rest 45 minutes following the intravenous administration of Technetium 71m Tetrofosmin.  Rest  ECG: No acute changes  Stress Procedure:  The patient exercised on the treadmill utilizing the Bruce protocol for 6:30 minutes. He then stopped due to moderate dyspnea with a peak O2 SAT of 97%.  He denied any chest pain.  There were ST-T wave changes and occasional PAC's noted.  Technetium 74m Tetrofosmin was injected at peak exercise and myocardial perfusion imaging was performed after a brief delay.  Stress ECG: No significant change from baseline ECG  QPS Raw Data Images:  Normal; no motion artifact; normal heart/lung ratio. Stress Images:  Normal homogeneous uptake in all areas of the myocardium. Rest Images:  Normal homogeneous uptake in all areas of the myocardium. Subtraction (SDS):  Normal Transient Ischemic Dilatation (Normal <1.22):  0.95 Lung/Heart Ratio (Normal <0.45):  0.29  Quantitative Gated Spect Images QGS EDV:  99 ml QGS ESV:  32 ml  Impression Exercise Capacity:  Fair exercise capacity. BP Response:  Normal blood pressure response. Clinical Symptoms:  There is dyspnea. ECG Impression:  No significant ST segment change suggestive of ischemia. Comparison with Prior Nuclear Study: No previous nuclear study performed  Overall Impression:  Normal stress nuclear study. Baseline ECG with ICRBBB  LV Ejection Fraction: 67%.  LV Wall Motion:  NL LV Function; NL Wall Motion  Charlton Haws    Normal stress test, please tell patient.   Marca Ancona 07/08/2011 12:27 PM

## 2011-07-08 ENCOUNTER — Telehealth: Payer: Self-pay | Admitting: Cardiology

## 2011-07-08 NOTE — Progress Notes (Signed)
Pt.notified

## 2011-07-08 NOTE — Progress Notes (Signed)
LMTCB

## 2011-07-08 NOTE — Telephone Encounter (Signed)
Fu call °Pt returning your call  °

## 2011-07-08 NOTE — Telephone Encounter (Signed)
Spoke with pt about recent myoview results 

## 2011-07-22 ENCOUNTER — Other Ambulatory Visit: Payer: Self-pay | Admitting: Family Medicine

## 2011-07-30 ENCOUNTER — Other Ambulatory Visit (INDEPENDENT_AMBULATORY_CARE_PROVIDER_SITE_OTHER): Payer: BC Managed Care – PPO

## 2011-07-30 DIAGNOSIS — Z0000 Encounter for general adult medical examination without abnormal findings: Secondary | ICD-10-CM

## 2011-07-30 LAB — HEPATIC FUNCTION PANEL
ALT: 28 U/L (ref 0–53)
AST: 23 U/L (ref 0–37)
Albumin: 3.8 g/dL (ref 3.5–5.2)
Alkaline Phosphatase: 48 U/L (ref 39–117)
Bilirubin, Direct: 0.1 mg/dL (ref 0.0–0.3)
Total Bilirubin: 0.8 mg/dL (ref 0.3–1.2)
Total Protein: 6.3 g/dL (ref 6.0–8.3)

## 2011-07-30 LAB — POCT URINALYSIS DIPSTICK
Bilirubin, UA: NEGATIVE
Blood, UA: NEGATIVE
Glucose, UA: NEGATIVE
Ketones, UA: NEGATIVE
Leukocytes, UA: NEGATIVE
Nitrite, UA: NEGATIVE
Protein, UA: NEGATIVE
Spec Grav, UA: 1.02
Urobilinogen, UA: 0.2
pH, UA: 7

## 2011-07-30 LAB — BASIC METABOLIC PANEL
BUN: 27 mg/dL — ABNORMAL HIGH (ref 6–23)
CO2: 31 mEq/L (ref 19–32)
Calcium: 9.3 mg/dL (ref 8.4–10.5)
Chloride: 100 mEq/L (ref 96–112)
Creatinine, Ser: 1.1 mg/dL (ref 0.4–1.5)
GFR: 71.44 mL/min (ref >60.00)
Glucose, Bld: 100 mg/dL — ABNORMAL HIGH (ref 70–99)
Potassium: 3.8 mEq/L (ref 3.5–5.1)
Sodium: 141 mEq/L (ref 135–145)

## 2011-07-30 LAB — CBC WITH DIFFERENTIAL/PLATELET
Basophils Absolute: 0 10*3/uL (ref 0.0–0.1)
Basophils Relative: 0.5 % (ref 0.0–3.0)
Eosinophils Absolute: 0.5 10*3/uL (ref 0.0–0.7)
Eosinophils Relative: 7.1 % — ABNORMAL HIGH (ref 0.0–5.0)
HCT: 43.7 % (ref 39.0–52.0)
Hemoglobin: 14.6 g/dL (ref 13.0–17.0)
Lymphocytes Relative: 24.6 % (ref 12.0–46.0)
Lymphs Abs: 1.8 10*3/uL (ref 0.7–4.0)
MCHC: 33.5 g/dL (ref 30.0–36.0)
MCV: 89.3 fl (ref 78.0–100.0)
Monocytes Absolute: 0.6 10*3/uL (ref 0.1–1.0)
Monocytes Relative: 8 % (ref 3.0–12.0)
Neutro Abs: 4.4 10*3/uL (ref 1.4–7.7)
Neutrophils Relative %: 59.8 % (ref 43.0–77.0)
Platelets: 226 10*3/uL (ref 150.0–400.0)
RBC: 4.89 Mil/uL (ref 4.22–5.81)
RDW: 13.4 % (ref 11.5–14.6)
WBC: 7.4 10*3/uL (ref 4.5–10.5)

## 2011-07-30 LAB — LIPID PANEL
Cholesterol: 169 mg/dL (ref 0–200)
HDL: 44.7 mg/dL (ref >39.00)
LDL Cholesterol: 101 mg/dL — ABNORMAL HIGH (ref 0–99)
Total CHOL/HDL Ratio: 4
Triglycerides: 119 mg/dL (ref 0.0–149.0)
VLDL: 23.8 mg/dL (ref 0.0–40.0)

## 2011-07-30 LAB — PSA: PSA: 0.91 ng/mL (ref 0.10–4.00)

## 2011-07-30 LAB — TSH: TSH: 1.52 u[IU]/mL (ref 0.35–5.50)

## 2011-08-05 ENCOUNTER — Encounter: Payer: Self-pay | Admitting: Family Medicine

## 2011-08-05 ENCOUNTER — Ambulatory Visit (INDEPENDENT_AMBULATORY_CARE_PROVIDER_SITE_OTHER): Payer: BC Managed Care – PPO | Admitting: Family Medicine

## 2011-08-05 DIAGNOSIS — I10 Essential (primary) hypertension: Secondary | ICD-10-CM

## 2011-08-05 DIAGNOSIS — R079 Chest pain, unspecified: Secondary | ICD-10-CM

## 2011-08-05 DIAGNOSIS — K219 Gastro-esophageal reflux disease without esophagitis: Secondary | ICD-10-CM

## 2011-08-05 DIAGNOSIS — E785 Hyperlipidemia, unspecified: Secondary | ICD-10-CM

## 2011-08-05 MED ORDER — POTASSIUM CHLORIDE CRYS ER 20 MEQ PO TBCR: 20.0000 meq | EXTENDED_RELEASE_TABLET | Freq: Every day | ORAL | Status: DC

## 2011-08-05 MED ORDER — ATENOLOL-CHLORTHALIDONE 50-25 MG PO TABS: 1.0000 | ORAL_TABLET | Freq: Every day | ORAL | Status: DC

## 2011-08-05 MED ORDER — ESOMEPRAZOLE MAGNESIUM 40 MG PO CPDR: 40.0000 mg | DELAYED_RELEASE_CAPSULE | Freq: Every day | ORAL | Status: DC

## 2011-08-05 MED ORDER — SIMVASTATIN 40 MG PO TABS: 40.0000 mg | ORAL_TABLET | Freq: Every day | ORAL | Status: DC

## 2011-08-05 MED ORDER — DOXAZOSIN MESYLATE 8 MG PO TABS: 8.0000 mg | ORAL_TABLET | Freq: Every day | ORAL | Status: DC

## 2011-08-05 NOTE — Patient Instructions (Signed)
Continue your current medications  Followup in 1 year sooner if any problems  Get some over-the-counter cortisone cream and apply nightly for the redness and irritation of your rectum

## 2011-08-05 NOTE — Progress Notes (Signed)
  Subjective:    Patient ID: James [name], male    DOB: [birth year], 61 y.o.   MRN: [id number]  Hypertension  Hyperlipidemia  Gastrophageal Reflux   James [name] is a 61 year old male nonsmoker who comes in today for general physical examination because of a history of hypertension hyperlipidemia allergic rhinitis reflux esophagitis and recently this spring atypical chest pain  Medications reviewed in detail the been no changes.  He did have an episode the spring of chest pain was referred to the emergency room and subsequent was admitted for overnight observation. Cardiac enzymes are normal followup cardiac evaluation and stress test were normal. He takes Nexium daily. He's due to see Dr. Dickie La next week for endoscopy and colonoscopy. I suspect his chest pain is related to his esophageal reflux and not cardiac disease  Vaccinations up-to-date  Review of Systems  Constitutional: Negative.   HENT: Negative.   Eyes: Negative.   Respiratory: Negative.   Cardiovascular: Negative.   Gastrointestinal: Negative.   Genitourinary: Negative.   Musculoskeletal: Negative.   Skin: Negative.   Neurological: Negative.   Hematological: Negative.   Psychiatric/Behavioral: Negative.    He saw Dr. 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 Luciana Axe    Objective:   Physical Exam  Constitutional: He is oriented to person, place, and time. He appears well-developed and well-nourished.  HENT:  Head: Normocephalic and atraumatic.  Right Ear: External ear normal.  Left Ear: External ear normal.  Nose: Nose normal.  Mouth/Throat: Oropharynx is clear and moist.  Eyes: Conjunctivae and EOM are normal. Pupils are equal, round, and reactive to light.  Neck: Normal range of motion. Neck supple. No JVD present. No tracheal deviation present. No thyromegaly present.  Cardiovascular: Normal  rate, regular rhythm, normal heart sounds and intact distal pulses.  Exam reveals no gallop and no friction rub.   No murmur heard. Pulmonary/Chest: Effort normal and breath sounds normal. No stridor. No respiratory distress. He has no wheezes. He has no rales. He exhibits no tenderness.  Abdominal: Soft. Bowel sounds are normal. He exhibits no distension and no mass. There is no tenderness. There is no rebound and no guarding.  Genitourinary: Rectum normal, prostate normal and penis normal. Guaiac negative stool. No penile tenderness.       There is some erythema around the rectum and a small resolving hemorrhoid  Musculoskeletal: Normal range of motion. He exhibits no edema and no tenderness.  Lymphadenopathy:    He has no cervical adenopathy.  Neurological: He is alert and oriented to person, place, and time. He has normal reflexes. No cranial nerve deficit. He exhibits normal muscle tone.  Skin: Skin is warm and dry. No rash noted. No erythema. No pallor.  Psychiatric: He has a normal mood and affect. His behavior is normal. Judgment and thought content normal.          Assessment & Plan:  Healthy male  Hypertension continue current medication  Reflux esophagitis continue Nexium 40 daily  Allergic rhinitis continue OTC Claritin  Hyperlipidemia continue Zocor 40 daily  History of retinal detachments followup by Dr. Luciana Axe

## 2011-08-10 ENCOUNTER — Ambulatory Visit (AMBULATORY_SURGERY_CENTER): Payer: BC Managed Care – PPO | Admitting: Internal Medicine

## 2011-08-10 ENCOUNTER — Encounter: Payer: Self-pay | Admitting: Internal Medicine

## 2011-08-10 VITALS — BP 147/74 | HR 78 | Temp 96.4°F | Resp 18 | Ht 76.0 in | Wt 255.0 lb

## 2011-08-10 DIAGNOSIS — IMO0001 Reserved for inherently not codable concepts without codable children: Secondary | ICD-10-CM

## 2011-08-10 DIAGNOSIS — K219 Gastro-esophageal reflux disease without esophagitis: Secondary | ICD-10-CM

## 2011-08-10 DIAGNOSIS — Z1211 Encounter for screening for malignant neoplasm of colon: Secondary | ICD-10-CM

## 2011-08-10 DIAGNOSIS — D130 Benign neoplasm of esophagus: Secondary | ICD-10-CM

## 2011-08-10 DIAGNOSIS — R1310 Dysphagia, unspecified: Secondary | ICD-10-CM

## 2011-08-10 MED ORDER — SODIUM CHLORIDE 0.9 % IV SOLN: 500.0000 mL | INTRAVENOUS | Status: DC

## 2011-08-10 NOTE — Patient Instructions (Signed)
Discharge instructions given with verbal understanding. Handout on a dilatation diet. Resume previous medications. YOU HAD AN ENDOSCOPIC PROCEDURE TODAY AT THE [facility]: Refer to the procedure report that was given to you for any specific questions about what was found during the examination.  If the procedure report does not answer your questions, please call your gastroenterologist to clarify.  If you requested that your care partner not be given the details of your procedure findings, then the procedure report has been included in a sealed envelope for you to review at your convenience later.  YOU SHOULD EXPECT: Some feelings of bloating in the abdomen. Passage of more gas than usual.  Walking can help get rid of the air that was put into your GI tract during the procedure and reduce the bloating. If you had a lower endoscopy (such as a colonoscopy or flexible sigmoidoscopy) you may notice spotting of blood in your stool or on the toilet paper. If you underwent a bowel prep for your procedure, then you may not have a normal bowel movement for a few days.  DIET: Your first meal following the procedure should be a light meal and then it is ok to progress to your normal diet.  A half-sandwich or bowl of soup is an example of a good first meal.  Heavy or fried foods are harder to digest and may make you feel nauseous or bloated.  Likewise meals heavy in dairy and vegetables can cause extra gas to form and this can also increase the bloating.  Drink plenty of fluids but you should avoid alcoholic beverages for 24 hours.  ACTIVITY: Your care partner should take you home directly after the procedure.  You should plan to take it easy, moving slowly for the rest of the day.  You can resume normal activity the day after the procedure however you should NOT DRIVE or use heavy machinery for 24 hours (because of the sedation medicines used during the test).    SYMPTOMS TO REPORT IMMEDIATELY: A  gastroenterologist can be reached at any hour.  During normal business hours, 8:30 AM to 5:00 PM Monday through Friday, call [phone].  After hours and on weekends, please call the GI answering service at [phone] who will take a message and have the physician on call contact you.   Following lower endoscopy (colonoscopy or flexible sigmoidoscopy):  Excessive amounts of blood in the stool  Significant tenderness or worsening of abdominal pains  Swelling of the abdomen that is new, acute  Fever of 100F or higher  Following upper endoscopy (EGD)  Vomiting of blood or coffee ground material  New chest pain or pain under the shoulder blades  Painful or persistently difficult swallowing  New shortness of breath  Fever of 100F or higher  Black, tarry-looking stools  FOLLOW UP: If any biopsies were taken you will be contacted by phone or by letter within the next 1-3 weeks.  Call your gastroenterologist if you have not heard about the biopsies in 3 weeks.  Our staff will call the home number listed on your records the next business day following your procedure to check on you and address any questions or concerns that you may have at that time regarding the information given to you following your procedure. This is a courtesy call and so if there is no answer at the home number and we have not heard from you through the emergency physician on call, we will assume that you have   to your regular daily activities without incident.  SIGNATURES/CONFIDENTIALITY: You and/or your care partner have signed paperwork which will be entered into your electronic medical record.  These signatures attest to the fact that that the information above on your After Visit Summary has been reviewed and is understood.  Full responsibility of the confidentiality of this discharge information lies with you and/or your care-partner.  

## 2011-08-10 NOTE — Op Note (Signed)
[facility] [address] [place], [place]  [address]  ENDOSCOPY PROCEDURE REPORT  PATIENT:  James [name], James [name]  MR#:  [id number] BIRTHDATE:  1950/08/07, 60 yrs. old  GENDER:  male  ENDOSCOPIST:  Hedwig Morton. Juanda Chance, MD Referred by:  Eugenio Hoes Tawanna Cooler, M.D.  PROCEDURE DATE:  08/10/2011 PROCEDURE:  EGD with biopsy, 43239, Maloney Dilation of Esophagus ASA CLASS:  Class II INDICATIONS:  dysphagia, GERD EGD 1997 with dil  MEDICATIONS:   MAC sedation, administered by CRNA, propofol (Diprivan) 300 mg TOPICAL ANESTHETIC:  none  DESCRIPTION OF PROCEDURE:   After the risks benefits and alternatives of the procedure were thoroughly explained, informed consent was obtained.  The LB [id number]-H180 [id number] endoscope was introduced through the mouth and advanced to the second portion of the duodenum, without limitations.  The instrument was slowly withdrawn as the mucosa was fully examined. <<PROCEDUREIMAGES>>  The upper, middle, and distal third of the esophagus were carefully inspected and no abnormalities were noted. The z-line was well seen at the GEJ. The endoscope was pushed into the fundus which was normal including a retroflexed view. The antrum,gastric body, first and second part of the duodenum were unremarkable (see image1, image2, image3, image4, and image5). ? spasm at the g-e-junction, no definite stricture maloney dilator 66F maloney dil passed without difficulty    Retroflexed views revealed no abnormalities.    The scope was then withdrawn from the patient and the procedure completed.  COMPLICATIONS:  None  ENDOSCOPIC IMPRESSION: 1) Normal EGD s/p biopsies g-e junction, s/p passage of 52 F maloney dilator, no definite stricture RECOMMENDATIONS: 1) Anti-reflux regimen to be follow cont Nexiem 40 mg po qd  REPEAT EXAM:  In 0 year(s) for.  ______________________________ Hedwig Morton. Juanda Chance, MD  CC:  n. eSIGNED:   Hedwig Morton. Vaneza Pickart at 08/10/2011 03:53 PM  [name],  [phone]

## 2011-08-10 NOTE — Progress Notes (Signed)
Patient did not experience any of the following events: a burn prior to discharge; a fall within the facility; wrong site/side/patient/procedure/implant event; or a hospital transfer or hospital admission upon discharge from the facility. (G8907) Patient did not have preoperative order for IV antibiotic SSI prophylaxis. (G8918)  

## 2011-08-10 NOTE — Op Note (Signed)
[facility] [address] [place], [place]  [address]  COLONOSCOPY PROCEDURE REPORT  PATIENT:  James [name], James [name]  MR#:  [id number] BIRTHDATE:  27-May-1950, 60 yrs. old  GENDER:  male ENDOSCOPIST:  Hedwig Morton. Juanda Chance, MD REF. BY:  Tinnie Gens A. Tawanna Cooler, M.D. PROCEDURE DATE:  08/10/2011 PROCEDURE:  Colonoscopy 78295 ASA CLASS:  Class II INDICATIONS:  colorectal cancer screening, average risk last colon 2003 was normal MEDICATIONS:   MAC sedation, administered by CRNA, propofol (Diprivan) 250 mg  DESCRIPTION OF PROCEDURE:   After the risks and benefits and of the procedure were explained, informed consent was obtained. Digital rectal exam was performed and revealed no rectal masses. The LB PCF-H180AL [id number] endoscope was introduced through the anus and advanced to the cecum, which was identified by both the appendix and ileocecal valve.  The quality of the prep was good, using MoviPrep.  The instrument was then slowly withdrawn as the colon was fully examined. <<PROCEDUREIMAGES>>  FINDINGS:  No polyps or cancers were seen (see image1, image2, image3, and image4).   Retroflexed views in the rectum revealed no abnormalities.    The scope was then withdrawn from the patient and the procedure completed.  COMPLICATIONS:  None ENDOSCOPIC IMPRESSION: 1) No polyps or cancers 2) Normal colonoscopy RECOMMENDATIONS: 1) High fiber diet.  REPEAT EXAM:  In 10 year(s) for.  ______________________________ Hedwig Morton. Juanda Chance, MD  CC:  n. eSIGNED:   Hedwig Morton. Azaylia Fong at 08/10/2011 03:56 PM  [name], [phone]

## 2011-08-11 ENCOUNTER — Telehealth: Payer: Self-pay | Admitting: *Deleted

## 2011-08-11 NOTE — Telephone Encounter (Signed)
Left message on number given in admitting yesterday. ewm 

## 2011-08-17 ENCOUNTER — Encounter: Payer: Self-pay | Admitting: Internal Medicine

## 2012-03-02 ENCOUNTER — Ambulatory Visit: Payer: Self-pay

## 2012-03-02 ENCOUNTER — Other Ambulatory Visit: Payer: Self-pay | Admitting: Occupational Medicine

## 2012-03-02 DIAGNOSIS — M549 Dorsalgia, unspecified: Secondary | ICD-10-CM

## 2012-04-12 ENCOUNTER — Other Ambulatory Visit: Payer: Self-pay | Admitting: Family Medicine

## 2012-12-11 ENCOUNTER — Encounter: Payer: Self-pay | Admitting: Family Medicine

## 2012-12-26 ENCOUNTER — Other Ambulatory Visit (INDEPENDENT_AMBULATORY_CARE_PROVIDER_SITE_OTHER): Payer: BC Managed Care – PPO

## 2012-12-26 DIAGNOSIS — Z0000 Encounter for general adult medical examination without abnormal findings: Secondary | ICD-10-CM

## 2012-12-26 LAB — TSH: TSH: 1.23 u[IU]/mL (ref 0.35–5.50)

## 2012-12-26 LAB — POCT URINALYSIS DIPSTICK
Bilirubin, UA: NEGATIVE
Blood, UA: NEGATIVE
Glucose, UA: NEGATIVE
Ketones, UA: NEGATIVE
Leukocytes, UA: NEGATIVE
Nitrite, UA: NEGATIVE
Protein, UA: NEGATIVE
Spec Grav, UA: 1.015
Urobilinogen, UA: 0.2
pH, UA: 6.5

## 2012-12-26 LAB — HEPATIC FUNCTION PANEL
ALT: 28 U/L (ref 0–53)
AST: 22 U/L (ref 0–37)
Albumin: 4.2 g/dL (ref 3.5–5.2)
Alkaline Phosphatase: 48 U/L (ref 39–117)
Bilirubin, Direct: 0.2 mg/dL (ref 0.0–0.3)
Total Bilirubin: 0.9 mg/dL (ref 0.3–1.2)
Total Protein: 6.7 g/dL (ref 6.0–8.3)

## 2012-12-26 LAB — BASIC METABOLIC PANEL
BUN: 19 mg/dL (ref 6–23)
CO2: 31 mEq/L (ref 19–32)
Calcium: 9.4 mg/dL (ref 8.4–10.5)
Chloride: 99 mEq/L (ref 96–112)
Creatinine, Ser: 1.2 mg/dL (ref 0.4–1.5)
GFR: 66.94 mL/min (ref >60.00)
Glucose, Bld: 108 mg/dL — ABNORMAL HIGH (ref 70–99)
Potassium: 3.4 mEq/L — ABNORMAL LOW (ref 3.5–5.1)
Sodium: 139 mEq/L (ref 135–145)

## 2012-12-26 LAB — PSA: PSA: 1.2 ng/mL (ref 0.10–4.00)

## 2012-12-26 LAB — CBC WITH DIFFERENTIAL/PLATELET
Basophils Absolute: 0 10*3/uL (ref 0.0–0.1)
Basophils Relative: 0.5 % (ref 0.0–3.0)
Eosinophils Absolute: 0.5 10*3/uL (ref 0.0–0.7)
Eosinophils Relative: 6.9 % — ABNORMAL HIGH (ref 0.0–5.0)
HCT: 45.9 % (ref 39.0–52.0)
Hemoglobin: 15.6 g/dL (ref 13.0–17.0)
Lymphocytes Relative: 24.8 % (ref 12.0–46.0)
Lymphs Abs: 1.7 10*3/uL (ref 0.7–4.0)
MCHC: 33.9 g/dL (ref 30.0–36.0)
MCV: 89.3 fl (ref 78.0–100.0)
Monocytes Absolute: 0.5 10*3/uL (ref 0.1–1.0)
Monocytes Relative: 6.9 % (ref 3.0–12.0)
Neutro Abs: 4.3 10*3/uL (ref 1.4–7.7)
Neutrophils Relative %: 60.9 % (ref 43.0–77.0)
Platelets: 263 10*3/uL (ref 150.0–400.0)
RBC: 5.14 Mil/uL (ref 4.22–5.81)
RDW: 13.2 % (ref 11.5–14.6)
WBC: 7.1 10*3/uL (ref 4.5–10.5)

## 2012-12-26 LAB — LIPID PANEL
Cholesterol: 178 mg/dL (ref 0–200)
HDL: 43.6 mg/dL (ref >39.00)
LDL Cholesterol: 104 mg/dL — ABNORMAL HIGH (ref 0–99)
Total CHOL/HDL Ratio: 4
Triglycerides: 151 mg/dL — ABNORMAL HIGH (ref 0.0–149.0)
VLDL: 30.2 mg/dL (ref 0.0–40.0)

## 2013-01-02 ENCOUNTER — Encounter: Payer: Self-pay | Admitting: Family Medicine

## 2013-01-02 ENCOUNTER — Ambulatory Visit (INDEPENDENT_AMBULATORY_CARE_PROVIDER_SITE_OTHER): Payer: BC Managed Care – PPO | Admitting: Family Medicine

## 2013-01-02 VITALS — BP 120/84 | Temp 98.8°F | Ht 75.5 in | Wt 248.0 lb

## 2013-01-02 DIAGNOSIS — I10 Essential (primary) hypertension: Secondary | ICD-10-CM

## 2013-01-02 DIAGNOSIS — K219 Gastro-esophageal reflux disease without esophagitis: Secondary | ICD-10-CM

## 2013-01-02 DIAGNOSIS — E785 Hyperlipidemia, unspecified: Secondary | ICD-10-CM

## 2013-01-02 DIAGNOSIS — Z23 Encounter for immunization: Secondary | ICD-10-CM

## 2013-01-02 DIAGNOSIS — Z2911 Encounter for prophylactic immunotherapy for respiratory syncytial virus (RSV): Secondary | ICD-10-CM

## 2013-01-02 MED ORDER — ATENOLOL-CHLORTHALIDONE 50-25 MG PO TABS: 1.0000 | ORAL_TABLET | Freq: Every day | ORAL | Status: DC

## 2013-01-02 MED ORDER — OMEPRAZOLE 40 MG PO CPDR: 40.0000 mg | DELAYED_RELEASE_CAPSULE | Freq: Every day | ORAL | Status: DC

## 2013-01-02 MED ORDER — SIMVASTATIN 40 MG PO TABS: 40.0000 mg | ORAL_TABLET | Freq: Every day | ORAL | Status: DC

## 2013-01-02 MED ORDER — POTASSIUM CHLORIDE CRYS ER 20 MEQ PO TBCR: EXTENDED_RELEASE_TABLET | ORAL | Status: DC

## 2013-01-02 MED ORDER — DOXAZOSIN MESYLATE 8 MG PO TABS: 8.0000 mg | ORAL_TABLET | Freq: Every day | ORAL | Status: DC

## 2013-01-02 NOTE — Addendum Note (Signed)
Addended by: Kern Reap B on: 01/02/2013 04:41 PM   Modules accepted: Orders

## 2013-01-02 NOTE — Patient Instructions (Signed)
Continue current medications except stop the Nexium and take the Prilosec one daily  Return in one year sooner if any health problems or questions

## 2013-01-02 NOTE — Progress Notes (Signed)
  Subjective:    Patient ID: James [name], male    DOB: 05/20/1950, 62 y.o.   MRN: [id number]  HPI James [name] is a 62 year old married male nonsmoker recently retired in the spring after 36 years with [employer] who comes in today for a physical examination because of a history of hypertension, allergic rhinitis, hyperlipidemia, and reflux esophagitis  He had an upper endoscopy and colonoscopy and this spring by Dr. Dickie La which was normal. He would like to switch from Nexium to Prilosec  He had a second retinal detachment and is followed by Dr. Luciana Axe. He gets routine eye care, dental care, colonoscopy as noted above, tetanus 2005, flu shot today.  Since he is retired he's been trying to exercise and play golf and enjoy life on a regular basis. His wife is a nurse was laid off from [employer]   Review of Systems  Constitutional: Negative.   HENT: Negative.   Eyes: Negative.   Respiratory: Negative.   Cardiovascular: Negative.   Gastrointestinal: Negative.   Endocrine: Negative.   Genitourinary: Negative.   Musculoskeletal: Negative.   Skin: Negative.   Neurological: Negative.   Hematological: Negative.   Psychiatric/Behavioral: Negative.        Objective:   Physical Exam  Nursing note and vitals reviewed. Constitutional: He is oriented to person, place, and time. He appears well-developed and well-nourished.  HENT:  Head: Normocephalic and atraumatic.  Right Ear: External ear normal.  Left Ear: External ear normal.  Nose: Nose normal.  Mouth/Throat: Oropharynx is clear and moist.  Eyes: Conjunctivae and EOM are normal. Pupils are equal, round, and reactive to light.  Neck: Normal range of motion. Neck supple. No JVD present. No tracheal deviation present. No thyromegaly present.  Cardiovascular: Normal rate, regular rhythm, normal heart sounds and intact distal pulses.  Exam reveals no gallop and no friction rub.   No murmur heard. Pulmonary/Chest: Effort normal and breath  sounds normal. No stridor. No respiratory distress. He has no wheezes. He has no rales. He exhibits no tenderness.  Abdominal: Soft. Bowel sounds are normal. He exhibits no distension and no mass. There is no tenderness. There is no rebound and no guarding.  Genitourinary: Rectum normal, prostate normal and penis normal. Guaiac negative stool. No penile tenderness.  Musculoskeletal: Normal range of motion. He exhibits no edema and no tenderness.  Lymphadenopathy:    He has no cervical adenopathy.  Neurological: He is alert and oriented to person, place, and time. He has normal reflexes. No cranial nerve deficit. He exhibits normal muscle tone.  Skin: Skin is warm and dry. No rash noted. No erythema. No pallor.  Total body skin exam normal except for scar midline lower back lumbar area from previous to surgery  Psychiatric: He has a normal mood and affect. His behavior is normal. Judgment and thought content normal.          Assessment & Plan:  Healthy male  Hypertension at goal continue current therapy  Hyperlipidemia continue current therapy  Reflux esophagitis switch to Prilosec  History of retinal detachments followed by Dr. Elmer Picker and Rankin

## 2014-01-22 ENCOUNTER — Other Ambulatory Visit (INDEPENDENT_AMBULATORY_CARE_PROVIDER_SITE_OTHER): Payer: BC Managed Care – PPO

## 2014-01-22 DIAGNOSIS — Z0000 Encounter for general adult medical examination without abnormal findings: Secondary | ICD-10-CM

## 2014-01-22 LAB — HEPATIC FUNCTION PANEL
ALT: 29 U/L (ref 0–53)
AST: 20 U/L (ref 0–37)
Albumin: 3.7 g/dL (ref 3.5–5.2)
Alkaline Phosphatase: 56 U/L (ref 39–117)
Bilirubin, Direct: 0.1 mg/dL (ref 0.0–0.3)
Total Bilirubin: 0.8 mg/dL (ref 0.2–1.2)
Total Protein: 6.9 g/dL (ref 6.0–8.3)

## 2014-01-22 LAB — POCT URINALYSIS DIPSTICK
Bilirubin, UA: NEGATIVE
Blood, UA: NEGATIVE
Glucose, UA: NEGATIVE
Ketones, UA: NEGATIVE
Leukocytes, UA: NEGATIVE
Nitrite, UA: NEGATIVE
Protein, UA: NEGATIVE
Spec Grav, UA: 1.015
Urobilinogen, UA: 1
pH, UA: 7

## 2014-01-22 LAB — CBC WITH DIFFERENTIAL/PLATELET
Basophils Absolute: 0 10*3/uL (ref 0.0–0.1)
Basophils Relative: 0.5 % (ref 0.0–3.0)
Eosinophils Absolute: 0.5 10*3/uL (ref 0.0–0.7)
Eosinophils Relative: 8 % — ABNORMAL HIGH (ref 0.0–5.0)
HCT: 45.4 % (ref 39.0–52.0)
Hemoglobin: 15.2 g/dL (ref 13.0–17.0)
Lymphocytes Relative: 26.1 % (ref 12.0–46.0)
Lymphs Abs: 1.8 10*3/uL (ref 0.7–4.0)
MCHC: 33.5 g/dL (ref 30.0–36.0)
MCV: 87.8 fl (ref 78.0–100.0)
Monocytes Absolute: 0.5 10*3/uL (ref 0.1–1.0)
Monocytes Relative: 7.3 % (ref 3.0–12.0)
Neutro Abs: 3.9 10*3/uL (ref 1.4–7.7)
Neutrophils Relative %: 58.1 % (ref 43.0–77.0)
Platelets: 239 10*3/uL (ref 150.0–400.0)
RBC: 5.17 Mil/uL (ref 4.22–5.81)
RDW: 13.4 % (ref 11.5–15.5)
WBC: 6.8 10*3/uL (ref 4.0–10.5)

## 2014-01-22 LAB — BASIC METABOLIC PANEL
BUN: 20 mg/dL (ref 6–23)
CO2: 29 mEq/L (ref 19–32)
Calcium: 9.8 mg/dL (ref 8.4–10.5)
Chloride: 106 mEq/L (ref 96–112)
Creatinine, Ser: 1.2 mg/dL (ref 0.4–1.5)
GFR: 62.99 mL/min (ref >60.00)
Glucose, Bld: 117 mg/dL — ABNORMAL HIGH (ref 70–99)
Potassium: 3.8 mEq/L (ref 3.5–5.1)
Sodium: 149 mEq/L — ABNORMAL HIGH (ref 135–145)

## 2014-01-22 LAB — PSA: PSA: 1.42 ng/mL (ref 0.10–4.00)

## 2014-01-22 LAB — LIPID PANEL
Cholesterol: 196 mg/dL (ref 0–200)
HDL: 52.9 mg/dL (ref >39.00)
LDL Cholesterol: 112 mg/dL — ABNORMAL HIGH (ref 0–99)
NonHDL: 143.1
Total CHOL/HDL Ratio: 4
Triglycerides: 155 mg/dL — ABNORMAL HIGH (ref 0.0–149.0)
VLDL: 31 mg/dL (ref 0.0–40.0)

## 2014-01-22 LAB — TSH: TSH: 1.81 u[IU]/mL (ref 0.35–4.50)

## 2014-01-29 ENCOUNTER — Encounter: Payer: Self-pay | Admitting: Family Medicine

## 2014-01-29 ENCOUNTER — Ambulatory Visit (INDEPENDENT_AMBULATORY_CARE_PROVIDER_SITE_OTHER): Payer: BC Managed Care – PPO | Admitting: Family Medicine

## 2014-01-29 VITALS — BP 120/80 | Temp 98.6°F | Ht 75.0 in | Wt 242.0 lb

## 2014-01-29 DIAGNOSIS — I10 Essential (primary) hypertension: Secondary | ICD-10-CM

## 2014-01-29 DIAGNOSIS — K219 Gastro-esophageal reflux disease without esophagitis: Secondary | ICD-10-CM

## 2014-01-29 DIAGNOSIS — E785 Hyperlipidemia, unspecified: Secondary | ICD-10-CM

## 2014-01-29 DIAGNOSIS — Z23 Encounter for immunization: Secondary | ICD-10-CM

## 2014-01-29 MED ORDER — DOXAZOSIN MESYLATE 8 MG PO TABS: 8.0000 mg | ORAL_TABLET | Freq: Every day | ORAL | Status: DC

## 2014-01-29 MED ORDER — SIMVASTATIN 40 MG PO TABS: 40.0000 mg | ORAL_TABLET | Freq: Every day | ORAL | Status: DC

## 2014-01-29 MED ORDER — OMEPRAZOLE 40 MG PO CPDR: 40.0000 mg | DELAYED_RELEASE_CAPSULE | Freq: Every day | ORAL | Status: DC

## 2014-01-29 MED ORDER — ATENOLOL-CHLORTHALIDONE 50-25 MG PO TABS
1.0000 | ORAL_TABLET | Freq: Every day | ORAL | Status: DC
Start: 2014-01-29 — End: 2015-01-17

## 2014-01-29 MED ORDER — POTASSIUM CHLORIDE CRYS ER 20 MEQ PO TBCR: 20.0000 meq | EXTENDED_RELEASE_TABLET | Freq: Every day | ORAL | Status: DC

## 2014-01-29 NOTE — Progress Notes (Signed)
Pre visit review using our clinic review tool, if applicable. No additional management support is needed unless otherwise documented below in the visit note. 

## 2014-01-29 NOTE — Patient Instructions (Signed)
Continue current medications  Follow-up in 1 year sooner if any problem  Continue to avoid sugar and walk daily sugar currently doing

## 2014-01-29 NOTE — Progress Notes (Signed)
   Subjective:    Patient ID: James [name], male    DOB: September 09, 1950, 63 y.o.   MRN: [id number]  HPI James [name] is a 63 year old male recently retired who comes in today for evaluation of hypertension, reflux esophagitis, hyperlipidemia  He says he feels well and has no complaints. This year he's had another retinal detachment treated by Dr. Zadie [name] Initially had bilateral cataracts removed by Dr. Herbert [name] 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  He gets routine eye care, dental care, colonoscopy in 2014 normal. BP at home 115/83  Vaccinations updated by James [name]   Review of Systems  Constitutional: Negative.   HENT: Negative.   Eyes: Negative.   Respiratory: Negative.   Cardiovascular: Negative.   Gastrointestinal: Negative.   Endocrine: Negative.   Genitourinary: Negative.   Musculoskeletal: Negative.   Skin: Negative.   Allergic/Immunologic: Negative.   Neurological: Negative.   Hematological: Negative.   Psychiatric/Behavioral: Negative.        Objective:   Physical Exam  Constitutional: He is oriented to person, place, and time. He appears well-developed and well-nourished.  HENT:  Head: Normocephalic and atraumatic.  Right Ear: External ear normal.  Left Ear: External ear normal.  Nose: Nose normal.  Mouth/Throat: Oropharynx is clear and moist.  Eyes: Conjunctivae and EOM are normal. Pupils are equal, round, and reactive to light.  Neck: Normal range of motion. Neck supple. No JVD present. No tracheal deviation present. No thyromegaly present.  Cardiovascular: Normal rate, regular rhythm, normal heart sounds and intact distal pulses.  Exam reveals no gallop and no friction rub.   No murmur heard. No carotid nor aortic bruits peripheral pulses 2+ and symmetrical  Pulmonary/Chest: Effort normal and breath sounds normal. No stridor. No respiratory distress. He has no wheezes. He has no rales. He exhibits no tenderness.  Abdominal: Soft. Bowel sounds are normal. He exhibits  no distension and no mass. There is no tenderness. There is no rebound and no guarding.  Genitourinary: Rectum normal, prostate normal and penis normal. Guaiac negative stool. No penile tenderness.  Musculoskeletal: Normal range of motion. He exhibits no edema or tenderness.  Lymphadenopathy:    He has no cervical adenopathy.  Neurological: He is alert and oriented to person, place, and time. He has normal reflexes. No cranial nerve deficit. He exhibits normal muscle tone.  Skin: Skin is warm and dry. No rash noted. No erythema. No pallor.  Psychiatric: He has a normal mood and affect. His behavior is normal. Judgment and thought content normal.  Nursing note and vitals reviewed.         Assessment & Plan:  Healthy male  Hypertension ago continue current therapy  Reflux esophagitis under good control with Prilosec 20 mg daily continue  Hyperlipidemia goal continue Zocor 40 mg daily and an aspirin tablet.  Status post bilateral cataract and lens implant,,,,, Dr. Herbert [name]  Retinal detachments 5,,,,,,,,,,, treated by Dr. Zadie [name]

## 2014-03-29 ENCOUNTER — Telehealth: Payer: Self-pay | Admitting: Family Medicine

## 2014-03-29 NOTE — Telephone Encounter (Signed)
Pt has some general questions he qould like to ask you . Pt would like a cb.

## 2014-04-01 NOTE — Telephone Encounter (Signed)
Spoke with patient.

## 2014-07-24 ENCOUNTER — Other Ambulatory Visit: Payer: Self-pay | Admitting: Family Medicine

## 2014-10-28 ENCOUNTER — Other Ambulatory Visit: Payer: Self-pay | Admitting: Neurosurgery

## 2014-11-03 NOTE — Pre-Procedure Instructions (Signed)
James [name]  11/03/2014      [id number] [place], [place] - [address] [place] [address] Phone: [phone] Fax: [phone]    Your procedure is scheduled on August 19th, Friday.   Report to [facility] Admitting at 7:00 AM   Call this number if you have problems the morning of surgery:  [phone]   Remember:  Do not eat food or drink liquids after midnight Thursday.  Take these medicines the morning of surgery with A SIP OF WATER : Tenoretic, Omeprazole, Oxycodone.             Please stop taking any aspirin, anti-inflammatories (advil, aleve, ibuprofen, etc) 4-5 days prior to surgery.   Do not wear jewelry - no rings or watches.   Do not wear lotions or colognes.  You may NOT wear deodorant the day of surgery.              Men may shave face and neck.    Do not bring valuables to the hospital.  [facility] is not responsible for any belongings or valuables.  Contacts, dentures or bridgework may not be worn into surgery.  Leave your suitcase in the car.  After surgery it may be brought to your room. For patients admitted to the hospital, discharge time will be determined by your treatment team.  Name and phone number of your driver: /with wife     Special instructions:  Special Instructions: [place] - Preparing for Surgery  Before surgery, you can play an important role.  Because skin is not sterile, your skin needs to be as free of germs as possible.  You can reduce the number of germs on you skin by washing with CHG (chlorahexidine gluconate) soap before surgery.  CHG is an antiseptic cleaner which kills germs and bonds with the skin to continue killing germs even after washing.  Please DO NOT use if you have an allergy to CHG or antibacterial soaps.  If your skin becomes reddened/irritated stop using the CHG and inform your nurse when you arrive at Short Stay.  Do not shave (including legs and underarms)  for at least 48 hours prior to the first CHG shower.  You may shave your face.  Please follow these instructions carefully:   1.  Shower with CHG Soap the night before surgery and the  morning of Surgery.  2.  If you choose to wash your hair, wash your hair first as usual with your  normal shampoo.  3.  After you shampoo, rinse your hair and body thoroughly to remove the  Shampoo.  4.  Use CHG as you would any other liquid soap.  You can apply chg directly to the skin and wash gently with scrungie or a clean washcloth.  5.  Apply the CHG Soap to your body ONLY FROM THE NECK DOWN.    Do not use on open wounds or open sores.  Avoid contact with your eyes, ears, mouth and genitals (private parts).  Wash genitals (private parts)   with your normal soap.  6.  Wash thoroughly, paying special attention to the area where your surgery will be performed.  7.  Thoroughly rinse your body with warm water from the neck down.  8.  DO NOT shower/wash with your normal soap after using and rinsing off   the CHG Soap.  9.  Pat yourself dry with a clean towel.  10.  Wear clean pajamas.            11.  Place clean sheets on your bed the night of your first shower and do not sleep with pets.  Day of Surgery  Do not apply any lotions/deodorants the morning of surgery.  Please wear clean clothes to the hospital/surgery center.  Please read over the following fact sheets that you were given. Pain Booklet, Coughing and Deep Breathing, Blood Transfusion Information, MRSA Information and Surgical Site Infection Prevention

## 2014-11-04 ENCOUNTER — Encounter (HOSPITAL_COMMUNITY)
Admission: RE | Admit: 2014-11-04 | Discharge: 2014-11-04 | Disposition: A | Discharge status: Home / Self Care | Payer: BLUE CROSS/BLUE SHIELD | Source: Ambulatory Visit | Attending: Neurosurgery | Admitting: Neurosurgery

## 2014-11-04 ENCOUNTER — Encounter (HOSPITAL_COMMUNITY): Payer: Self-pay

## 2014-11-04 DIAGNOSIS — Z01812 Encounter for preprocedural laboratory examination: Secondary | ICD-10-CM | POA: Diagnosis not present

## 2014-11-04 DIAGNOSIS — Z0183 Encounter for blood typing: Secondary | ICD-10-CM | POA: Diagnosis not present

## 2014-11-04 DIAGNOSIS — M4806 Spinal stenosis, lumbar region: Secondary | ICD-10-CM | POA: Diagnosis not present

## 2014-11-04 HISTORY — DX: Myoneural disorder, unspecified: G70.9

## 2014-11-04 HISTORY — DX: Anxiety disorder, unspecified: F41.9

## 2014-11-04 HISTORY — DX: Personal history of other medical treatment: Z92.89

## 2014-11-04 LAB — CBC
HCT: 42.7 % (ref 39.0–52.0)
Hemoglobin: 15 g/dL (ref 13.0–17.0)
MCH: 30.1 pg (ref 26.0–34.0)
MCHC: 35.1 g/dL (ref 30.0–36.0)
MCV: 85.7 fL (ref 78.0–100.0)
Platelets: 225 10*3/uL (ref 150–400)
RBC: 4.98 MIL/uL (ref 4.22–5.81)
RDW: 12.6 % (ref 11.5–15.5)
WBC: 7.3 10*3/uL (ref 4.0–10.5)

## 2014-11-04 LAB — COMPREHENSIVE METABOLIC PANEL
ALT: 34 U/L (ref 17–63)
AST: 30 U/L (ref 15–41)
Albumin: 3.9 g/dL (ref 3.5–5.0)
Alkaline Phosphatase: 48 U/L (ref 38–126)
Anion gap: 10 (ref 5–15)
BUN: 18 mg/dL (ref 6–20)
CO2: 31 mmol/L (ref 22–32)
Calcium: 9 mg/dL (ref 8.9–10.3)
Chloride: 98 mmol/L — ABNORMAL LOW (ref 101–111)
Creatinine, Ser: 1.18 mg/dL (ref 0.61–1.24)
GFR calc Af Amer: >60 mL/min (ref >60)
GFR calc non Af Amer: >60 mL/min (ref >60)
Glucose, Bld: 105 mg/dL — ABNORMAL HIGH (ref 65–99)
Potassium: 3.1 mmol/L — ABNORMAL LOW (ref 3.5–5.1)
Sodium: 139 mmol/L (ref 135–145)
Total Bilirubin: 1 mg/dL (ref 0.3–1.2)
Total Protein: 6.2 g/dL — ABNORMAL LOW (ref 6.5–8.1)

## 2014-11-04 LAB — SURGICAL PCR SCREEN
MRSA, PCR: NEGATIVE
Staphylococcus aureus: POSITIVE — AB

## 2014-11-04 LAB — ABO/RH: ABO/RH(D): A NEG

## 2014-11-04 LAB — TYPE AND SCREEN
ABO/RH(D): A NEG
Antibody Screen: NEGATIVE

## 2014-11-04 NOTE — Progress Notes (Addendum)
I called a prescription for Mupirocin ointment to [facility], [place], [place].

## 2014-11-04 NOTE — Progress Notes (Signed)
Pt. Followed by Dr. Sherren Mocha for PCP, seen last at the end of 2015.  Pt. Seen by Dr. Aundra Dubin at one time, had a stress test that was wnl. Pt. Denies all chest concerns, reports feeling well with the exception of pain in his legs.

## 2014-11-07 MED ORDER — CEFAZOLIN SODIUM-DEXTROSE 2-3 GM-% IV SOLR: 2.0000 g | INTRAVENOUS | Status: DC

## 2014-11-08 ENCOUNTER — Inpatient Hospital Stay (HOSPITAL_COMMUNITY): Admission: RE | Admit: 2014-11-08 | Payer: BLUE CROSS/BLUE SHIELD | Source: Ambulatory Visit | Admitting: Neurosurgery

## 2014-11-08 ENCOUNTER — Encounter (HOSPITAL_COMMUNITY): Admission: RE | Payer: Self-pay | Source: Ambulatory Visit

## 2014-11-08 SURGERY — POSTERIOR LUMBAR FUSION 1 LEVEL
Anesthesia: General

## 2014-11-20 LAB — HM DIABETES EYE EXAM

## 2014-11-22 ENCOUNTER — Other Ambulatory Visit: Payer: Self-pay | Admitting: Neurosurgery

## 2014-11-22 ENCOUNTER — Encounter: Payer: Self-pay | Admitting: Family Medicine

## 2014-12-10 ENCOUNTER — Other Ambulatory Visit (HOSPITAL_COMMUNITY): Payer: Self-pay

## 2014-12-11 ENCOUNTER — Encounter (HOSPITAL_COMMUNITY)
Admission: RE | Admit: 2014-12-11 | Discharge: 2014-12-11 | Disposition: A | Discharge status: Home / Self Care | Payer: BLUE CROSS/BLUE SHIELD | Source: Ambulatory Visit | Attending: Neurosurgery | Admitting: Neurosurgery

## 2014-12-11 ENCOUNTER — Encounter (HOSPITAL_COMMUNITY): Payer: Self-pay

## 2014-12-11 ENCOUNTER — Other Ambulatory Visit (HOSPITAL_COMMUNITY): Payer: Self-pay | Admitting: *Deleted

## 2014-12-11 ENCOUNTER — Telehealth: Payer: Self-pay | Admitting: Family Medicine

## 2014-12-11 DIAGNOSIS — M4806 Spinal stenosis, lumbar region: Secondary | ICD-10-CM | POA: Insufficient documentation

## 2014-12-11 DIAGNOSIS — E876 Hypokalemia: Secondary | ICD-10-CM

## 2014-12-11 DIAGNOSIS — Z0183 Encounter for blood typing: Secondary | ICD-10-CM | POA: Diagnosis not present

## 2014-12-11 DIAGNOSIS — Z01812 Encounter for preprocedural laboratory examination: Secondary | ICD-10-CM | POA: Diagnosis present

## 2014-12-11 LAB — BASIC METABOLIC PANEL
Anion gap: 7 (ref 5–15)
BUN: 13 mg/dL (ref 6–20)
CO2: 31 mmol/L (ref 22–32)
Calcium: 9.3 mg/dL (ref 8.9–10.3)
Chloride: 101 mmol/L (ref 101–111)
Creatinine, Ser: 1.21 mg/dL (ref 0.61–1.24)
GFR calc Af Amer: >60 mL/min (ref >60)
GFR calc non Af Amer: >60 mL/min (ref >60)
Glucose, Bld: 129 mg/dL — ABNORMAL HIGH (ref 65–99)
Potassium: 3 mmol/L — ABNORMAL LOW (ref 3.5–5.1)
Sodium: 139 mmol/L (ref 135–145)

## 2014-12-11 LAB — CBC
HCT: 43.8 % (ref 39.0–52.0)
Hemoglobin: 15.3 g/dL (ref 13.0–17.0)
MCH: 30 pg (ref 26.0–34.0)
MCHC: 34.9 g/dL (ref 30.0–36.0)
MCV: 85.9 fL (ref 78.0–100.0)
Platelets: 237 10*3/uL (ref 150–400)
RBC: 5.1 MIL/uL (ref 4.22–5.81)
RDW: 12.3 % (ref 11.5–15.5)
WBC: 7.8 10*3/uL (ref 4.0–10.5)

## 2014-12-11 LAB — TYPE AND SCREEN
ABO/RH(D): A NEG
Antibody Screen: NEGATIVE

## 2014-12-11 LAB — SURGICAL PCR SCREEN
MRSA, PCR: NEGATIVE
Staphylococcus aureus: NEGATIVE

## 2014-12-11 NOTE — Progress Notes (Addendum)
Pt reports that nothing has changed in medications or health history since preop visit a month ago. Pt reports surgery was cancelled for insurance reasons. Pt reports that he completed Mupirocin treatment for staph, PCR rechecked.

## 2014-12-11 NOTE — Progress Notes (Signed)
Pt's potassium 3.0 but has history of low potassium and takes potassium daily. Dr. Cleotilde Neer office called and spoke with Manuela Schwartz, will let doctor know.

## 2014-12-11 NOTE — Pre-Procedure Instructions (Signed)
    Diontae Route  12/11/2014      [id number] - [place], [place] - [place] [address] Phone: [phone] Fax: [phone]    Your procedure is scheduled on Friday, December 20, 2014 at 10:20 AM.  Report to [facility] Entrance "A" Admitting Office at 8:15 AM.  Call this number if you have problems the morning of surgery: [phone]  Any questions prior to day of surgery, please call [phone] between 8 & 4 PM.   Remember:  Do not eat food or drink liquids after midnight Thursday, 12/19/14.  Take these medicines the morning of surgery with A SIP OF WATER: Atenolol-Chlorthalidone (Tenoretic), Omeprazole (Prilosec), Oxycodone - if needed   Do not wear jewelry.  Do not wear lotions, powders, or cologne.  You may wear deodorant.  Men may shave face and neck.  Do not bring valuables to the hospital.  [facility] is not responsible for any belongings or valuables.  Contacts, dentures or bridgework may not be worn into surgery.  Leave your suitcase in the car.  After surgery it may be brought to your room.  For patients admitted to the hospital, discharge time will be determined by your treatment team.  Special instructions:  See "Preparing for Surgery" Instruction sheet.  Please read over the following fact sheets that you were given. Pain Booklet, Coughing and Deep Breathing, Blood Transfusion Information, MRSA Information and Surgical Site Infection Prevention

## 2014-12-11 NOTE — Telephone Encounter (Signed)
Pt is having back surgery next Fri, 9/30. Pt preop labs showed low potassium. It was 3.0.  Needs to be 3.5.  Dr Sherren Mocha does prescribe potasium.  Potassium chloride SA (KLOR-CON M20) 20 MEQ tablet. They want dr todd to make the decsion on what increase pt should take. Pt needs to start ASAP pls advise

## 2014-12-12 NOTE — Telephone Encounter (Signed)
Waiting on Dr Todd's response.  

## 2014-12-12 NOTE — Telephone Encounter (Signed)
Per Dr Sherren Mocha,  Patient should increase his potassium to 2 tabs daily.  He should return next week for a lab.  Patient is aware,  Lab appointment made, lab ordered.

## 2014-12-17 ENCOUNTER — Other Ambulatory Visit: Payer: BLUE CROSS/BLUE SHIELD

## 2014-12-19 MED ORDER — CEFAZOLIN SODIUM-DEXTROSE 2-3 GM-% IV SOLR
2.0000 g | INTRAVENOUS | Status: AC
  Administered 2014-12-20: 2 g via INTRAVENOUS
  Filled 2014-12-19: qty 50

## 2014-12-20 ENCOUNTER — Inpatient Hospital Stay (HOSPITAL_COMMUNITY)
Admission: RE | Admit: 2014-12-20 | Discharge: 2014-12-21 | DRG: 460 | Disposition: A | Discharge status: Home / Self Care | Payer: BLUE CROSS/BLUE SHIELD | Source: Ambulatory Visit | Attending: Neurosurgery | Admitting: Neurosurgery

## 2014-12-20 ENCOUNTER — Encounter (HOSPITAL_COMMUNITY): Payer: Self-pay | Admitting: *Deleted

## 2014-12-20 ENCOUNTER — Inpatient Hospital Stay (HOSPITAL_COMMUNITY): Payer: BLUE CROSS/BLUE SHIELD | Admitting: Anesthesiology

## 2014-12-20 ENCOUNTER — Inpatient Hospital Stay (HOSPITAL_COMMUNITY): Payer: BLUE CROSS/BLUE SHIELD

## 2014-12-20 ENCOUNTER — Encounter (HOSPITAL_COMMUNITY)
Admission: RE | Disposition: A | Discharge status: Home / Self Care | Payer: Self-pay | Source: Ambulatory Visit | Attending: Neurosurgery

## 2014-12-20 DIAGNOSIS — M4806 Spinal stenosis, lumbar region: Secondary | ICD-10-CM | POA: Diagnosis present

## 2014-12-20 DIAGNOSIS — F419 Anxiety disorder, unspecified: Secondary | ICD-10-CM | POA: Diagnosis present

## 2014-12-20 DIAGNOSIS — M47816 Spondylosis without myelopathy or radiculopathy, lumbar region: Secondary | ICD-10-CM | POA: Diagnosis present

## 2014-12-20 DIAGNOSIS — M549 Dorsalgia, unspecified: Secondary | ICD-10-CM | POA: Diagnosis present

## 2014-12-20 DIAGNOSIS — E785 Hyperlipidemia, unspecified: Secondary | ICD-10-CM | POA: Diagnosis present

## 2014-12-20 DIAGNOSIS — Z7982 Long term (current) use of aspirin: Secondary | ICD-10-CM

## 2014-12-20 DIAGNOSIS — Z419 Encounter for procedure for purposes other than remedying health state, unspecified: Secondary | ICD-10-CM

## 2014-12-20 DIAGNOSIS — K219 Gastro-esophageal reflux disease without esophagitis: Secondary | ICD-10-CM | POA: Diagnosis present

## 2014-12-20 DIAGNOSIS — I10 Essential (primary) hypertension: Secondary | ICD-10-CM | POA: Diagnosis present

## 2014-12-20 DIAGNOSIS — Z23 Encounter for immunization: Secondary | ICD-10-CM

## 2014-12-20 LAB — CREATININE, SERUM
Creatinine, Ser: 0.99 mg/dL (ref 0.61–1.24)
GFR calc Af Amer: >60 mL/min (ref >60)
GFR calc non Af Amer: >60 mL/min (ref >60)

## 2014-12-20 LAB — CBC
HCT: 36.5 % — ABNORMAL LOW (ref 39.0–52.0)
Hemoglobin: 12.5 g/dL — ABNORMAL LOW (ref 13.0–17.0)
MCH: 29.7 pg (ref 26.0–34.0)
MCHC: 34.2 g/dL (ref 30.0–36.0)
MCV: 86.7 fL (ref 78.0–100.0)
Platelets: 184 10*3/uL (ref 150–400)
RBC: 4.21 MIL/uL — ABNORMAL LOW (ref 4.22–5.81)
RDW: 12.4 % (ref 11.5–15.5)
WBC: 9.2 10*3/uL (ref 4.0–10.5)

## 2014-12-20 SURGERY — POSTERIOR LUMBAR FUSION 1 LEVEL
Anesthesia: General | Site: Spine Lumbar

## 2014-12-20 MED ORDER — PHENYLEPHRINE HCL 10 MG/ML IJ SOLN
10.0000 mg | INTRAMUSCULAR | Status: DC | PRN
  Administered 2014-12-20: 25 ug/min via INTRAVENOUS

## 2014-12-20 MED ORDER — SODIUM CHLORIDE 0.9 % IV SOLN: 250.0000 mL | INTRAVENOUS | Status: DC

## 2014-12-20 MED ORDER — PHENYLEPHRINE HCL 10 MG/ML IJ SOLN
INTRAMUSCULAR | Status: DC | PRN
  Administered 2014-12-20 (×2): 80 ug via INTRAVENOUS

## 2014-12-20 MED ORDER — LIDOCAINE-EPINEPHRINE 1 %-1:100000 IJ SOLN
INTRAMUSCULAR | Status: DC | PRN
  Administered 2014-12-20: 4.5 mL

## 2014-12-20 MED ORDER — FENTANYL CITRATE (PF) 100 MCG/2ML IJ SOLN
INTRAMUSCULAR | Status: DC | PRN
  Administered 2014-12-20: 100 ug via INTRAVENOUS
  Administered 2014-12-20 (×3): 50 ug via INTRAVENOUS
  Administered 2014-12-20: 100 ug via INTRAVENOUS

## 2014-12-20 MED ORDER — PHENOL 1.4 % MT LIQD: 1.0000 | OROMUCOSAL | Status: DC | PRN

## 2014-12-20 MED ORDER — PROMETHAZINE HCL 25 MG/ML IJ SOLN: 6.2500 mg | INTRAMUSCULAR | Status: DC | PRN

## 2014-12-20 MED ORDER — ARTIFICIAL TEARS OP OINT
TOPICAL_OINTMENT | OPHTHALMIC | Status: DC | PRN
  Administered 2014-12-20: 1 via OPHTHALMIC

## 2014-12-20 MED ORDER — SIMVASTATIN 40 MG PO TABS
40.0000 mg | ORAL_TABLET | Freq: Every day | ORAL | Status: DC
  Filled 2014-12-20 (×2): qty 1

## 2014-12-20 MED ORDER — ATENOLOL-CHLORTHALIDONE 50-25 MG PO TABS: 1.0000 | ORAL_TABLET | Freq: Every day | ORAL | Status: DC

## 2014-12-20 MED ORDER — OXYCODONE HCL 5 MG PO TABS
10.0000 mg | ORAL_TABLET | Freq: Once | ORAL | Status: AC
  Administered 2014-12-20: 10 mg via ORAL

## 2014-12-20 MED ORDER — SODIUM CHLORIDE 0.9 % IR SOLN
Status: DC | PRN
  Administered 2014-12-20: 11:00:00

## 2014-12-20 MED ORDER — GLYCOPYRROLATE 0.2 MG/ML IJ SOLN
INTRAMUSCULAR | Status: DC | PRN
  Administered 2014-12-20: .8 mg via INTRAVENOUS

## 2014-12-20 MED ORDER — PROPOFOL 10 MG/ML IV BOLUS
INTRAVENOUS | Status: AC
  Filled 2014-12-20: qty 20

## 2014-12-20 MED ORDER — DOXAZOSIN MESYLATE 8 MG PO TABS
8.0000 mg | ORAL_TABLET | Freq: Every day | ORAL | Status: DC
  Filled 2014-12-20 (×2): qty 1

## 2014-12-20 MED ORDER — OXYCODONE-ACETAMINOPHEN 5-325 MG PO TABS
1.0000 | ORAL_TABLET | ORAL | Status: DC | PRN
  Administered 2014-12-20 – 2014-12-21 (×3): 2 via ORAL
  Filled 2014-12-20 (×3): qty 2

## 2014-12-20 MED ORDER — NEOSTIGMINE METHYLSULFATE 10 MG/10ML IV SOLN
INTRAVENOUS | Status: AC
  Filled 2014-12-20: qty 1

## 2014-12-20 MED ORDER — FENTANYL CITRATE (PF) 250 MCG/5ML IJ SOLN
INTRAMUSCULAR | Status: AC
  Filled 2014-12-20: qty 5

## 2014-12-20 MED ORDER — OXYCODONE HCL 5 MG PO TABS
5.0000 mg | ORAL_TABLET | Freq: Once | ORAL | Status: AC | PRN
  Administered 2014-12-20: 5 mg via ORAL

## 2014-12-20 MED ORDER — HYDROMORPHONE HCL 1 MG/ML IJ SOLN
INTRAMUSCULAR | Status: AC
  Filled 2014-12-20: qty 1

## 2014-12-20 MED ORDER — ACETAMINOPHEN 325 MG PO TABS: 650.0000 mg | ORAL_TABLET | ORAL | Status: DC | PRN

## 2014-12-20 MED ORDER — PANTOPRAZOLE SODIUM 40 MG PO TBEC
80.0000 mg | DELAYED_RELEASE_TABLET | Freq: Every day | ORAL | Status: DC
  Administered 2014-12-21: 80 mg via ORAL
  Filled 2014-12-20: qty 2

## 2014-12-20 MED ORDER — ROCURONIUM BROMIDE 100 MG/10ML IV SOLN
INTRAVENOUS | Status: DC | PRN
  Administered 2014-12-20: 50 mg via INTRAVENOUS
  Administered 2014-12-20: 20 mg via INTRAVENOUS
  Administered 2014-12-20 (×2): 10 mg via INTRAVENOUS
  Administered 2014-12-20: 20 mg via INTRAVENOUS
  Administered 2014-12-20: 10 mg via INTRAVENOUS

## 2014-12-20 MED ORDER — EPHEDRINE SULFATE 50 MG/ML IJ SOLN
INTRAMUSCULAR | Status: AC
  Filled 2014-12-20: qty 2

## 2014-12-20 MED ORDER — HEPARIN SODIUM (PORCINE) 5000 UNIT/ML IJ SOLN
5000.0000 [IU] | Freq: Three times a day (TID) | INTRAMUSCULAR | Status: DC
  Administered 2014-12-21: 5000 [IU] via SUBCUTANEOUS
  Filled 2014-12-20: qty 1

## 2014-12-20 MED ORDER — HYDROMORPHONE HCL 1 MG/ML IJ SOLN
0.2500 mg | INTRAMUSCULAR | Status: DC | PRN
  Administered 2014-12-20 (×4): 0.5 mg via INTRAVENOUS

## 2014-12-20 MED ORDER — BUPIVACAINE HCL (PF) 0.5 % IJ SOLN
INTRAMUSCULAR | Status: DC | PRN
  Administered 2014-12-20: 4.5 mL

## 2014-12-20 MED ORDER — ARTIFICIAL TEARS OP OINT
TOPICAL_OINTMENT | OPHTHALMIC | Status: AC
  Filled 2014-12-20: qty 7

## 2014-12-20 MED ORDER — LIDOCAINE HCL (CARDIAC) 20 MG/ML IV SOLN
INTRAVENOUS | Status: AC
  Filled 2014-12-20: qty 10

## 2014-12-20 MED ORDER — ALBUMIN HUMAN 5 % IV SOLN
INTRAVENOUS | Status: DC | PRN
Start: 2014-12-20 — End: 2014-12-20
  Administered 2014-12-20: 13:00:00 via INTRAVENOUS

## 2014-12-20 MED ORDER — NALOXONE HCL 0.4 MG/ML IJ SOLN
INTRAMUSCULAR | Status: AC
  Filled 2014-12-20: qty 1

## 2014-12-20 MED ORDER — SENNA 8.6 MG PO TABS
1.0000 | ORAL_TABLET | Freq: Two times a day (BID) | ORAL | Status: DC
Start: 2014-12-20 — End: 2014-12-21
  Administered 2014-12-20: 8.6 mg via ORAL
  Filled 2014-12-20 (×2): qty 1

## 2014-12-20 MED ORDER — MIDAZOLAM HCL 5 MG/5ML IJ SOLN
INTRAMUSCULAR | Status: DC | PRN
  Administered 2014-12-20 (×2): 2 mg via INTRAVENOUS

## 2014-12-20 MED ORDER — THROMBIN 20000 UNITS EX SOLR
CUTANEOUS | Status: DC | PRN
  Administered 2014-12-20: 11:00:00 via TOPICAL

## 2014-12-20 MED ORDER — HYDROMORPHONE HCL 1 MG/ML IJ SOLN
0.5000 mg | Freq: Once | INTRAMUSCULAR | Status: AC
  Administered 2014-12-20: 0.5 mg via INTRAVENOUS

## 2014-12-20 MED ORDER — LACTATED RINGERS IV SOLN
INTRAVENOUS | Status: DC
  Administered 2014-12-20 (×3): via INTRAVENOUS

## 2014-12-20 MED ORDER — BISACODYL 10 MG RE SUPP
10.0000 mg | Freq: Every day | RECTAL | Status: DC | PRN
Start: 2014-12-20 — End: 2014-12-21

## 2014-12-20 MED ORDER — IBUPROFEN 200 MG PO TABS: 400.0000 mg | ORAL_TABLET | Freq: Four times a day (QID) | ORAL | Status: DC | PRN

## 2014-12-20 MED ORDER — NEOSTIGMINE METHYLSULFATE 10 MG/10ML IV SOLN
INTRAVENOUS | Status: DC | PRN
  Administered 2014-12-20: 5 mg via INTRAVENOUS

## 2014-12-20 MED ORDER — ROCURONIUM BROMIDE 50 MG/5ML IV SOLN
INTRAVENOUS | Status: AC
  Filled 2014-12-20: qty 2

## 2014-12-20 MED ORDER — OXYCODONE HCL 5 MG PO TABS
ORAL_TABLET | ORAL | Status: AC
  Filled 2014-12-20: qty 1

## 2014-12-20 MED ORDER — PROPOFOL 10 MG/ML IV BOLUS
INTRAVENOUS | Status: DC | PRN
  Administered 2014-12-20: 200 mg via INTRAVENOUS

## 2014-12-20 MED ORDER — SODIUM CHLORIDE 0.9 % IJ SOLN
3.0000 mL | Freq: Two times a day (BID) | INTRAMUSCULAR | Status: DC
  Administered 2014-12-20: 3 mL via INTRAVENOUS

## 2014-12-20 MED ORDER — ATENOLOL 50 MG PO TABS
50.0000 mg | ORAL_TABLET | Freq: Every day | ORAL | Status: DC
  Administered 2014-12-20: 50 mg via ORAL
  Filled 2014-12-20 (×2): qty 1

## 2014-12-20 MED ORDER — CEFAZOLIN SODIUM-DEXTROSE 2-3 GM-% IV SOLR
2.0000 g | Freq: Three times a day (TID) | INTRAVENOUS | Status: AC
  Administered 2014-12-20 – 2014-12-21 (×2): 2 g via INTRAVENOUS
  Filled 2014-12-20 (×2): qty 50

## 2014-12-20 MED ORDER — LIDOCAINE HCL (CARDIAC) 20 MG/ML IV SOLN
INTRAVENOUS | Status: DC | PRN
  Administered 2014-12-20: 100 mg via INTRAVENOUS

## 2014-12-20 MED ORDER — PHENYLEPHRINE 40 MCG/ML (10ML) SYRINGE FOR IV PUSH (FOR BLOOD PRESSURE SUPPORT)
PREFILLED_SYRINGE | INTRAVENOUS | Status: AC
  Filled 2014-12-20: qty 10

## 2014-12-20 MED ORDER — SODIUM CHLORIDE 0.9 % IJ SOLN: 3.0000 mL | INTRAMUSCULAR | Status: DC | PRN

## 2014-12-20 MED ORDER — OXYCODONE HCL 5 MG/5ML PO SOLN: 5.0000 mg | Freq: Once | ORAL | Status: AC | PRN

## 2014-12-20 MED ORDER — 0.9 % SODIUM CHLORIDE (POUR BTL) OPTIME
TOPICAL | Status: DC | PRN
  Administered 2014-12-20: 1000 mL

## 2014-12-20 MED ORDER — DIAZEPAM 5 MG PO TABS
5.0000 mg | ORAL_TABLET | Freq: Four times a day (QID) | ORAL | Status: DC | PRN
  Administered 2014-12-20 – 2014-12-21 (×3): 5 mg via ORAL
  Filled 2014-12-20 (×3): qty 1

## 2014-12-20 MED ORDER — ONDANSETRON HCL 4 MG/2ML IJ SOLN
INTRAMUSCULAR | Status: DC | PRN
  Administered 2014-12-20: 4 mg via INTRAVENOUS

## 2014-12-20 MED ORDER — SODIUM CHLORIDE 0.9 % IV SOLN: INTRAVENOUS | Status: DC

## 2014-12-20 MED ORDER — ONDANSETRON HCL 4 MG/2ML IJ SOLN: 4.0000 mg | INTRAMUSCULAR | Status: DC | PRN

## 2014-12-20 MED ORDER — EPHEDRINE SULFATE 50 MG/ML IJ SOLN
INTRAMUSCULAR | Status: DC | PRN
  Administered 2014-12-20: 5 mg via INTRAVENOUS
  Administered 2014-12-20 (×4): 10 mg via INTRAVENOUS

## 2014-12-20 MED ORDER — MORPHINE SULFATE (PF) 2 MG/ML IV SOLN
1.0000 mg | INTRAVENOUS | Status: DC | PRN
  Administered 2014-12-20 – 2014-12-21 (×4): 4 mg via INTRAVENOUS
  Filled 2014-12-20 (×4): qty 2

## 2014-12-20 MED ORDER — ASPIRIN EC 81 MG PO TBEC
81.0000 mg | DELAYED_RELEASE_TABLET | Freq: Every day | ORAL | Status: DC
  Filled 2014-12-20 (×2): qty 1

## 2014-12-20 MED ORDER — SUGAMMADEX SODIUM 200 MG/2ML IV SOLN
INTRAVENOUS | Status: AC
  Filled 2014-12-20: qty 2

## 2014-12-20 MED ORDER — ROCURONIUM BROMIDE 50 MG/5ML IV SOLN
INTRAVENOUS | Status: AC
  Filled 2014-12-20: qty 1

## 2014-12-20 MED ORDER — OXYCODONE HCL 5 MG PO TABS
ORAL_TABLET | ORAL | Status: AC
  Filled 2014-12-20: qty 2

## 2014-12-20 MED ORDER — THROMBIN 5000 UNITS EX SOLR
OROMUCOSAL | Status: DC | PRN
  Administered 2014-12-20: 11:00:00 via TOPICAL

## 2014-12-20 MED ORDER — POTASSIUM CHLORIDE CRYS ER 20 MEQ PO TBCR
20.0000 meq | EXTENDED_RELEASE_TABLET | Freq: Every day | ORAL | Status: DC
  Administered 2014-12-20: 20 meq via ORAL
  Filled 2014-12-20 (×2): qty 1

## 2014-12-20 MED ORDER — DOCUSATE SODIUM 100 MG PO CAPS
100.0000 mg | ORAL_CAPSULE | Freq: Two times a day (BID) | ORAL | Status: DC
  Administered 2014-12-20: 100 mg via ORAL
  Filled 2014-12-20 (×2): qty 1

## 2014-12-20 MED ORDER — INFLUENZA VAC SPLIT QUAD 0.5 ML IM SUSY
0.5000 mL | PREFILLED_SYRINGE | INTRAMUSCULAR | Status: DC
  Filled 2014-12-20 (×2): qty 0.5

## 2014-12-20 MED ORDER — PANTOPRAZOLE SODIUM 40 MG IV SOLR: 40.0000 mg | Freq: Every day | INTRAVENOUS | Status: DC

## 2014-12-20 MED ORDER — ACETAMINOPHEN 650 MG RE SUPP: 650.0000 mg | RECTAL | Status: DC | PRN

## 2014-12-20 MED ORDER — MENTHOL 3 MG MT LOZG: 1.0000 | LOZENGE | OROMUCOSAL | Status: DC | PRN

## 2014-12-20 MED ORDER — DIAZEPAM 5 MG PO TABS
ORAL_TABLET | ORAL | Status: AC
  Filled 2014-12-20: qty 1

## 2014-12-20 MED ORDER — MIDAZOLAM HCL 2 MG/2ML IJ SOLN
INTRAMUSCULAR | Status: AC
  Filled 2014-12-20: qty 4

## 2014-12-20 MED ORDER — CHLORTHALIDONE 25 MG PO TABS
25.0000 mg | ORAL_TABLET | Freq: Every day | ORAL | Status: DC
  Administered 2014-12-20: 25 mg via ORAL
  Filled 2014-12-20 (×2): qty 1

## 2014-12-20 SURGICAL SUPPLY — 73 items
BAG DECANTER FOR FLEXI CONT (MISCELLANEOUS) ×2 IMPLANT
BENZOIN TINCTURE PRP APPL 2/3 (GAUZE/BANDAGES/DRESSINGS) IMPLANT
BIT DRILL 3.5 POWEREASE (BIT) ×4 IMPLANT
BLADE CLIPPER SURG (BLADE) IMPLANT
BLADE SURG 11 STRL SS (BLADE) ×2 IMPLANT
BUR MATCHSTICK NEURO 3.0 LAGG (BURR) ×2 IMPLANT
BUR PRECISION FLUTE 5.0 (BURR) ×2 IMPLANT
CAGE CAPSTONE 11X22 (Cage) ×4 IMPLANT
CANISTER SUCT 3000ML PPV (MISCELLANEOUS) ×2 IMPLANT
CONT SPEC 4OZ CLIKSEAL STRL BL (MISCELLANEOUS) ×2 IMPLANT
COVER BACK TABLE 60X90IN (DRAPES) ×2 IMPLANT
DECANTER SPIKE VIAL GLASS SM (MISCELLANEOUS) IMPLANT
DERMABOND ADVANCED (GAUZE/BANDAGES/DRESSINGS) ×1
DERMABOND ADVANCED .7 DNX12 (GAUZE/BANDAGES/DRESSINGS) ×1 IMPLANT
DEVICE DISSECT PLASMABLAD 3.0S (MISCELLANEOUS) ×1 IMPLANT
DRAPE C-ARM 42X72 X-RAY (DRAPES) ×4 IMPLANT
DRAPE C-ARMOR (DRAPES) ×2 IMPLANT
DRAPE LAPAROTOMY 100X72X124 (DRAPES) ×2 IMPLANT
DRAPE MICROSCOPE LEICA (MISCELLANEOUS) IMPLANT
DRAPE POUCH INSTRU U-SHP 10X18 (DRAPES) ×2 IMPLANT
DRAPE SURG 17X23 STRL (DRAPES) ×2 IMPLANT
DRSG OPSITE POSTOP 4X6 (GAUZE/BANDAGES/DRESSINGS) ×2 IMPLANT
DURAPREP 26ML APPLICATOR (WOUND CARE) ×2 IMPLANT
ELECT REM PT RETURN 9FT ADLT (ELECTROSURGICAL) ×2
ELECTRODE REM PT RTRN 9FT ADLT (ELECTROSURGICAL) ×1 IMPLANT
GAUZE SPONGE 4X4 12PLY STRL (GAUZE/BANDAGES/DRESSINGS) IMPLANT
GAUZE SPONGE 4X4 16PLY XRAY LF (GAUZE/BANDAGES/DRESSINGS) IMPLANT
GLOVE BIOGEL PI IND STRL 7.5 (GLOVE) ×1 IMPLANT
GLOVE BIOGEL PI INDICATOR 7.5 (GLOVE) ×1
GLOVE ECLIPSE 7.0 STRL STRAW (GLOVE) ×2 IMPLANT
GLOVE ECLIPSE 9.0 STRL (GLOVE) ×2 IMPLANT
GLOVE EXAM NITRILE LRG STRL (GLOVE) IMPLANT
GLOVE EXAM NITRILE MD LF STRL (GLOVE) IMPLANT
GLOVE EXAM NITRILE XL STR (GLOVE) IMPLANT
GLOVE EXAM NITRILE XS STR PU (GLOVE) IMPLANT
GOWN STRL REUS W/ TWL LRG LVL3 (GOWN DISPOSABLE) ×1 IMPLANT
GOWN STRL REUS W/ TWL XL LVL3 (GOWN DISPOSABLE) ×2 IMPLANT
GOWN STRL REUS W/TWL 2XL LVL3 (GOWN DISPOSABLE) IMPLANT
GOWN STRL REUS W/TWL LRG LVL3 (GOWN DISPOSABLE) ×1
GOWN STRL REUS W/TWL XL LVL3 (GOWN DISPOSABLE) ×2
HEMOSTAT POWDER KIT SURGIFOAM (HEMOSTASIS) ×2 IMPLANT
KIT BASIN OR (CUSTOM PROCEDURE TRAY) ×2 IMPLANT
KIT ROOM TURNOVER OR (KITS) ×2 IMPLANT
MILL MEDIUM DISP (BLADE) ×2 IMPLANT
NEEDLE EPIDURAL 6IN 14GA (NEEDLE) ×2 IMPLANT
NEEDLE HYPO 18GX1.5 BLUNT FILL (NEEDLE) ×2 IMPLANT
NEEDLE HYPO 25X1 1.5 SAFETY (NEEDLE) ×2 IMPLANT
NEEDLE SPNL 18GX3.5 QUINCKE PK (NEEDLE) IMPLANT
NS IRRIG 1000ML POUR BTL (IV SOLUTION) ×2 IMPLANT
PACK LAMINECTOMY NEURO (CUSTOM PROCEDURE TRAY) ×2 IMPLANT
PAD ARMBOARD 7.5X6 YLW CONV (MISCELLANEOUS) ×6 IMPLANT
PLASMABLADE 3.0S (MISCELLANEOUS) ×2
ROD CC 30MM (Rod) ×4 IMPLANT
RUBBERBAND STERILE (MISCELLANEOUS) ×4 IMPLANT
SCREW 5.5X30MM (Screw) ×2 IMPLANT
SCREW 5.5X35MM (Screw) ×3 IMPLANT
SCREW BN 35X5.5XMA NS SPNE (Screw) ×3 IMPLANT
SCREW SET SOLERA (Screw) ×4 IMPLANT
SCREW SET SOLERA TI (Screw) ×4 IMPLANT
SPONGE LAP 4X18 X RAY DECT (DISPOSABLE) IMPLANT
SPONGE SURGIFOAM ABS GEL 100 (HEMOSTASIS) ×2 IMPLANT
STRIP BIOACTIVE VITOSS 25X52X4 (Orthopedic Implant) ×2 IMPLANT
STRIP CLOSURE SKIN 1/2X4 (GAUZE/BANDAGES/DRESSINGS) IMPLANT
SUT VIC AB 0 CT1 18XCR BRD8 (SUTURE) ×1 IMPLANT
SUT VIC AB 0 CT1 8-18 (SUTURE) ×1
SUT VIC AB 2-0 CT1 18 (SUTURE) IMPLANT
SUT VICRYL 3-0 RB1 18 ABS (SUTURE) ×2 IMPLANT
SYR 3ML LL SCALE MARK (SYRINGE) ×2 IMPLANT
SYRINGE 10CC LL (SYRINGE) ×2 IMPLANT
TOWEL OR 17X24 6PK STRL BLUE (TOWEL DISPOSABLE) ×2 IMPLANT
TOWEL OR 17X26 10 PK STRL BLUE (TOWEL DISPOSABLE) ×2 IMPLANT
TRAP SPECIMEN MUCOUS 40CC (MISCELLANEOUS) ×2 IMPLANT
WATER STERILE IRR 1000ML POUR (IV SOLUTION) ×2 IMPLANT

## 2014-12-20 NOTE — Transfer of Care (Signed)
Immediate Anesthesia Transfer of Care Note  Patient: James [name]  Procedure(s) Performed: Procedure(s) with comments: LUMBAR TWO-THREE POSTERIOR LUMBAR FUSION (N/A) - L23 posterior lumbar interbody fusion with interbody prosthesis posterior lateral arthrodesis and posteiror nonsegmental instrumentation  Patient Location: PACU  Anesthesia Type:General  Level of Consciousness: awake, alert , oriented and sedated  Airway & Oxygen Therapy: Patient Spontanous Breathing and Patient connected to nasal cannula oxygen  Post-op Assessment: Report given to RN, Post -op Vital signs reviewed and stable and Patient moving all extremities X 4  Post vital signs: Reviewed and stable  Last Vitals:  Filed Vitals:   12/20/14 0823  BP: 135/78  Pulse: 63  Temp: 37.1 C  Resp: 20    Complications: No apparent anesthesia complications

## 2014-12-20 NOTE — Anesthesia Procedure Notes (Signed)
Procedure Name: Intubation Date/Time: 12/20/2014 10:15 AM Performed by: Gaylene Brooks Pre-anesthesia Checklist: Patient identified, Timeout performed, Emergency Drugs available, Suction available and Patient being monitored Patient Re-evaluated:Patient Re-evaluated prior to inductionOxygen Delivery Method: Circle system utilized Preoxygenation: Pre-oxygenation with 100% oxygen Intubation Type: IV induction Ventilation: Mask ventilation without difficulty Laryngoscope Size: Miller and 2 Grade View: Grade I Tube type: Oral Tube size: 7.5 mm Number of attempts: 1 Airway Equipment and Method: Stylet Placement Confirmation: ETT inserted through vocal cords under direct vision,  breath sounds checked- equal and bilateral,  positive ETCO2 and CO2 detector Secured at: 23 cm Tube secured with: Tape Dental Injury: Teeth and Oropharynx as per pre-operative assessment

## 2014-12-20 NOTE — Op Note (Signed)
PREOP DIAGNOSIS:  1. Lumbar spondylosis, L2-3 2. Spinal Stenosis, L2-3  POSTOP DIAGNOSIS: Same  PROCEDURE: 1. L2 laminectomy with complete facetectomy for decompression of exiting nerve roots, more than would be required for placement of interbody graft 2. Placement of anterior interbody device - Medtronic 63mm PEEK Capstone cage x2 3. Posterior instrumentation using cortical pedicle screws at L2, L3, 5.5x85mm x3, 5.5x35mm x1 4. Interbody arthrodesis, L2-3 5. Use of locally harvested bone autograft 6. Use of non-structural bone allograft (Vitoss)  SURGEON: Dr. Consuella Lose, MD  ASSISTANT: Dr. Janece Canterbury, MD  ANESTHESIA: General Endotracheal  EBL: 300cc  SPECIMENS: None  DRAINS: None  COMPLICATIONS: None  CONDITION: Hemodynamically stable to PACU  HISTORY: James [name] is a 64 y.o. male who was initially seen in the outpatient clinic with back and right-sided leg pain. He underwent MRI which demonstrated multifactorial stenosis at L2-3, with prior laminotomies at L3-4 and L4-5. He failed a reasonable course of conservative treatment, and elected to proceed with surgical decompression and fusion. The risks and benefits of the surgery were explained in detail to the patient and his family. After all questions were answered, informed consent was obtained.  PROCEDURE IN DETAIL: After informed consent was obtained and witnessed, the patient was brought to the operating room. After induction of general anesthesia, the patient was positioned on the operative table in the prone position. All pressure points were meticulously padded. Previous skin incision was then marked out and prepped and draped in the usual sterile fashion.  After timeout was conducted, skin was infiltrated with local anesthetic. Skin incision was then made sharply and Bovie electrocautery was used to dissect the subcutaneous tissue until the lumbodorsal fascia was identified and incised. The muscle was then  elevated in the subperiosteal plane. Self-retaining retractors were then placed after exposure of the L2 lamina out to the pars interarticularis as was the L1-2 and L2-3 facet complexes.  Using intraoperative fluoroscopy, entry points for bilateral L2 cortical pedicle screws were identified. Pilot holes were then created under lateral fluoroscopy and tapped to 5.5x35 mm.  At this point attention was turned to decompression. Complete L2 laminectomy with radical facetectomy was completed using a combination of Kerrison rongeurs and a high-speed drillbilateral L2 exiting nerve roots were identified and completely decompressed. Of note, there appeared to be multiple large free disc fragments compressing the right L2 nerve root, and displacing it superiorly against the right L2 pedicle. These were all removed with a combination of dissectors.   At this point, the disc space was identified, epidural veins were coagulated, and the disc space was incised bilaterally. Using a combination of curettes and rasps, complete discectomy at L2-3 was completed. The endplates were then prepared. The bone harvested from the decompression portion of the procedure was morcellized, and mixed with the V toss. This was then packed into the interspace. This graft was also used to pack 11 mm cages. Cages were then tapped into place under fluoroscopy. In placement of the second cage, it appeared that the first cage was rotated into a transverse position, and ended up in the anterior portion of the interspace. Final lateral fluoroscopic images appeared to confirm placement of both cages in the interspace.  At this point, cortical pedicle screw pilot holes were identified, drilled, and tapped under fluoroscopic guidance at L3-5 0.5 x 35 mm on the right, and 30 mm on the left. Pedicle screws were then placed, connected with a 35 mm rod, and setscrews were placed and final tightened.  Final AP and lateral fluoroscopic images confirmed good  position of the hardware.  The wound is then closed in standard fashion using a combination of interrupted 0 and 3-0 Vicryl stitches and the muscular, fascial, and subcutaneous layers. Skin was then closed using standard Dermabond. Sterile dressing was then applied. The patient was then transferred to the stretcher, extubated, and taken to the postanesthesia care unit in stable hemodynamic condition.  At the end of the case all sponge, needle,  cottonoid, and instrument counts were correct.

## 2014-12-20 NOTE — Anesthesia Preprocedure Evaluation (Addendum)
Anesthesia Evaluation  Patient identified by MRN, date of birth, ID band Patient awake    Reviewed: Allergy & Precautions, H&P , NPO status , Patient's Chart, lab work & pertinent test results, reviewed documented beta blocker date and time   History of Anesthesia Complications Negative for: history of anesthetic complications  Airway Mallampati: II  TM Distance: >3 FB Neck ROM: full    Dental no notable dental hx.    Pulmonary neg pulmonary ROS,    Pulmonary exam normal breath sounds clear to auscultation       Cardiovascular hypertension, Pt. on medications Normal cardiovascular exam Rhythm:regular Rate:Normal     Neuro/Psych Anxiety Patient with severe preop anxiety, much improved with IV midazolamnegative neurological ROS     GI/Hepatic Neg liver ROS, GERD  ,  Endo/Other  negative endocrine ROS  Renal/GU negative Renal ROS     Musculoskeletal  (+) Arthritis ,   Abdominal   Peds  Hematology negative hematology ROS (+)   Anesthesia Other Findings   Reproductive/Obstetrics negative OB ROS                            Anesthesia Physical Anesthesia Plan  ASA: II  Anesthesia Plan: General   Post-op Pain Management:    Induction: Intravenous  Airway Management Planned: Oral ETT  Additional Equipment:   Intra-op Plan:   Post-operative Plan: Extubation in OR  Informed Consent: I have reviewed the patients History and Physical, chart, labs and discussed the procedure including the risks, benefits and alternatives for the proposed anesthesia with the patient or authorized representative who has indicated his/her understanding and acceptance.   Dental Advisory Given  Plan Discussed with: Anesthesiologist, CRNA and Surgeon  Anesthesia Plan Comments:         Anesthesia Quick Evaluation

## 2014-12-20 NOTE — Progress Notes (Signed)
Dr Jillyn Hidden called about potassium level at pre-admit. States no need to re-draw potassium

## 2014-12-20 NOTE — Anesthesia Postprocedure Evaluation (Signed)
  Anesthesia Post-op Note  Patient: James [name]  Procedure(s) Performed: Procedure(s) (LRB): LUMBAR TWO-THREE POSTERIOR LUMBAR FUSION (N/A)  Patient Location: PACU  Anesthesia Type: General  Level of Consciousness: awake and alert   Airway and Oxygen Therapy: Patient Spontanous Breathing  Post-op Pain: moderate  Post-op Assessment: Post-op Vital signs reviewed, Patient's Cardiovascular Status Stable, Respiratory Function Stable, Patent Airway and No signs of Nausea or vomiting  Last Vitals:  Filed Vitals:   12/20/14 1551  BP: 103/70  Pulse: 67  Temp:   Resp: 16    Post-op Vital Signs: stable   Complications: No apparent anesthesia complications

## 2014-12-20 NOTE — H&P (Signed)
CC:  Back and leg pain  HPI: James [name] is a 64 year old man seen for back and leg pain. He comes in with a primary complaint of at least 3 or 4 month history of progressively worsening back and leg pain. There is no particular inciting event for this pain. He is describing initially pain which started in the right side of his back, with radiation down the back of his right leg to about the knee level. The leg pain primarily comes on when he walks for any length of time. Over the first few weeks, the pain shifted over to the left side, but is now primarily in the middle of his back. The leg pain, however continues down his right leg after he walks he says for about a quarter mile at which time he has to sit down. A few minutes of sitting makes the leg pain go away, and he can walk again. He does occasionally get similar symptoms on the left leg, but the right is definitely worse. He has visited a chiropractor for approximately 10 visits after this pain started, but this has not provided any significant relief. He has also been taking Advil which does not provide significant relief. He does not have any imbalance, or changes in bladder function.  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 He describes what appear to be discectomies, and says he did not have a lumbar fusion. The surgeries were about 20-25 years ago.   PMH: Past Medical History  Diagnosis Date  . GERD (gastroesophageal reflux disease)   . Hyperlipidemia   . Hypertension   . Hemorrhoids   . Heart murmur   . Shortness of breath   . Angina   . History of stress test 2013    told it was normal  . Anxiety     especially revolving around surgery & medical care   . Neuromuscular disorder     spinal stenosis     PSH: Past Surgical History  Procedure Laterality Date  . Appendectomy    . Testicle torsion reduction    . Cervical disc surgery    . Lumbar  laminectomy  1990's    x2  . Upper gastrointestinal endoscopy    . Colonoscopy    . Eye surgery  09&12/2009    x3- (total of 7 eye surgery)     SH: Social History  Substance Use Topics  . Smoking status: Never Smoker   . Smokeless tobacco: Never Used  . Alcohol Use: Yes     Comment: 1 bottle wine a week, on weekends     MEDS: Prior to Admission medications   Medication Sig Start Date End Date Taking? Authorizing Provider  atenolol-chlorthalidone (TENORETIC) 50-25 MG per tablet Take 1 tablet by mouth daily. 01/29/14  Yes Dorena Cookey, MD  doxazosin (CARDURA) 8 MG tablet Take 1 tablet (8 mg total) by mouth at bedtime. 01/29/14  Yes Dorena Cookey, MD  ibuprofen (ADVIL,MOTRIN) 200 MG tablet Take 400 mg by mouth every 6 (six) hours as needed (pain).   Yes Historical Provider, MD  omeprazole (PRILOSEC) 40 MG capsule Take 1 capsule (40 mg total) by mouth daily. 01/29/14  Yes Dorena Cookey, MD  oxyCODONE-acetaminophen (PERCOCET/ROXICET) 5-325 MG per tablet Take 1 tablet by mouth every 6 (six) hours as needed for severe pain (pain).   Yes Historical Provider, MD  potassium chloride SA (KLOR-CON M20)  20 MEQ tablet Take 1 tablet (20 mEq total) by mouth daily. 01/29/14  Yes Dorena Cookey, MD  simvastatin (ZOCOR) 40 MG tablet Take 1 tablet (40 mg total) by mouth at bedtime. 01/29/14  Yes Dorena Cookey, MD  aspirin 81 MG tablet Take 81 mg by mouth daily.      Historical Provider, MD    ALLERGY: Allergies  Allergen Reactions  . Hydrocodone Itching    ROS: ROS  NEUROLOGIC EXAM: Awake, alert, oriented Memory and concentration grossly intact Speech fluent, appropriate CN grossly intact Motor exam: Upper Extremities Deltoid Bicep Tricep Grip  Right 5/5 5/5 5/5 5/5  Left 5/5 5/5 5/5 5/5   Lower Extremity IP Quad PF DF EHL  Right 5/5 5/5 5/5 5/5 5/5  Left 5/5 5/5 5/5 5/5 5/5   Sensation grossly intact to LT   IMPRESSION: - 64 y.o. male with primarily claudication type  symptoms likely due to multifactorial stenosis at L23  PLAN: - Proceed with decompression/fusion at L2-3  I have reviewed the MRI findings with the patient.  Continued treatment options were discussed including proceeding with surgical decompression given his failure to conservative treatments.  I explained to the patient the details of the procedure, as well as the risks which include but are not limited to nerve root injury leading to leg or foot weakness and or bowel and bladder dysfunction, CSF leak, bleeding, and infection.  Possible outcomes of surgery were also discussed including the possibility of uncomplicated surgery but persistence of pain symptoms and the possiblity of accelerated adjacent level degeneration. The general risks of anesthesia were also reviewed including heart attack, stroke, and DVT/PE.    The patient understood our discussion and is willing to proceed with surgical decompression and fusion.  All questions were answered.

## 2014-12-21 LAB — CBC
HCT: 36.2 % — ABNORMAL LOW (ref 39.0–52.0)
Hemoglobin: 12.5 g/dL — ABNORMAL LOW (ref 13.0–17.0)
MCH: 30.1 pg (ref 26.0–34.0)
MCHC: 34.5 g/dL (ref 30.0–36.0)
MCV: 87.2 fL (ref 78.0–100.0)
Platelets: 176 10*3/uL (ref 150–400)
RBC: 4.15 MIL/uL — ABNORMAL LOW (ref 4.22–5.81)
RDW: 12.7 % (ref 11.5–15.5)
WBC: 7.9 10*3/uL (ref 4.0–10.5)

## 2014-12-21 LAB — BASIC METABOLIC PANEL
Anion gap: 8 (ref 5–15)
BUN: 10 mg/dL (ref 6–20)
CO2: 33 mmol/L — ABNORMAL HIGH (ref 22–32)
Calcium: 8.4 mg/dL — ABNORMAL LOW (ref 8.9–10.3)
Chloride: 95 mmol/L — ABNORMAL LOW (ref 101–111)
Creatinine, Ser: 1.1 mg/dL (ref 0.61–1.24)
GFR calc Af Amer: >60 mL/min (ref >60)
GFR calc non Af Amer: >60 mL/min (ref >60)
Glucose, Bld: 108 mg/dL — ABNORMAL HIGH (ref 65–99)
Potassium: 2.9 mmol/L — ABNORMAL LOW (ref 3.5–5.1)
Sodium: 136 mmol/L (ref 135–145)

## 2014-12-21 MED ORDER — DIAZEPAM 5 MG PO TABS: 5.0000 mg | ORAL_TABLET | Freq: Four times a day (QID) | ORAL | Status: DC | PRN

## 2014-12-21 MED ORDER — OXYCODONE-ACETAMINOPHEN 5-325 MG PO TABS: 1.0000 | ORAL_TABLET | ORAL | Status: DC | PRN

## 2014-12-21 MED ORDER — ONDANSETRON HCL 4 MG PO TABS
4.0000 mg | ORAL_TABLET | Freq: Once | ORAL | Status: AC
  Administered 2014-12-21: 4 mg via ORAL

## 2014-12-21 NOTE — Progress Notes (Signed)
Pt doing well. Pt given D/C instructions with Rx's, verbal understanding was provided. Pt's incision is covered with gauze dressing and has no sign of infection. Pt's IV was removed prior to D/C. Pt D/C'd home via wheelchair @ 1100 per MD order. Pt is stable @ D/C and has no other needs at this time. Holli Humbles, RN

## 2014-12-21 NOTE — Discharge Summary (Signed)
Physician Discharge Summary  Patient ID: James [name] MRN: [id number] DOB/AGE: Mar 13, 1951 64 y.o.  Admit date: 12/20/2014 Discharge date: 12/21/2014  Admission Diagnoses:  Discharge Diagnoses:  Active Problems:   Lumbar spondylosis   Discharged Condition: good  Hospital Course: the patient was admitted to the hospital where he underwent uncomplicated lumbar decompression and fusion. Postoperatively he is doing very well. Back and lower extremity pain much improved. Patient ambulating without difficulty. Ready for discharge home. Consults:   Significant Diagnostic Studies:   Treatments:   Discharge Exam: Blood pressure 114/74, pulse 89, temperature 98.4 F (36.9 C), temperature source Oral, resp. rate 18, height 6\' 4"  (1.93 m), weight 107.684 kg (237 lb 6.4 oz), SpO2 95 %. Awake and alert. Oriented and appropriate. Motor and sensory function intact. Wound clean and dry. Chest and abdomen benign.  Disposition: 01-Home or Self Care     Medication List    TAKE these medications        aspirin 81 MG tablet  Take 81 mg by mouth daily.     atenolol-chlorthalidone 50-25 MG tablet  Commonly known as:  TENORETIC  Take 1 tablet by mouth daily.     diazepam 5 MG tablet  Commonly known as:  VALIUM  Take 1 tablet (5 mg total) by mouth every 6 (six) hours as needed for muscle spasms.     doxazosin 8 MG tablet  Commonly known as:  CARDURA  Take 1 tablet (8 mg total) by mouth at bedtime.     ibuprofen 200 MG tablet  Commonly known as:  ADVIL,MOTRIN  Take 400 mg by mouth every 6 (six) hours as needed (pain).     omeprazole 40 MG capsule  Commonly known as:  PRILOSEC  Take 1 capsule (40 mg total) by mouth daily.     oxyCODONE-acetaminophen 5-325 MG tablet  Commonly known as:  PERCOCET/ROXICET  Take 1-2 tablets by mouth every 4 (four) hours as needed for severe pain (pain).     potassium chloride SA 20 MEQ tablet  Commonly known as:  KLOR-CON M20  Take 1 tablet (20 mEq  total) by mouth daily.     simvastatin 40 MG tablet  Commonly known as:  ZOCOR  Take 1 tablet (40 mg total) by mouth at bedtime.           Follow-up Information    Follow up with [facility], Bradley Ferris, MD.   Specialty:  Neurosurgery   Contact information:   [address] [place] [address] [phone]       Signed: Charlie Pitter 12/21/2014, 9:14 AM

## 2014-12-21 NOTE — Evaluation (Signed)
Physical Therapy Evaluation and Discharge Patient Details Name: James [name] MRN: [id number] DOB: 1950-06-11 Today's Date: 12/21/2014   History of Present Illness  Pt is a 64 y/o male who presents s/p PLIF L2-3 on 12/20/14.  Clinical Impression  Patient evaluated by Physical Therapy with no further acute PT needs identified. All education has been completed and the patient has no further questions. At the time of PT eval pt was grossly at a modified independent level for transfers and ambulation. All PT education completed. See below for any follow-up Physial Therapy or equipment needs. PT is signing off. Thank you for this referral.     Follow Up Recommendations Outpatient PT;Supervision - Intermittent (When appropriate per post-op protocol)    Equipment Recommendations  None recommended by PT    Recommendations for Other Services       Precautions / Restrictions Precautions Precautions: Fall;Back Precaution Booklet Issued: Yes (comment) Precaution Comments: Pt was educated on 3/3 precautions Required Braces or Orthoses: Spinal Brace Spinal Brace: Lumbar corset;Applied in sitting position Restrictions Weight Bearing Restrictions: No      Mobility  Bed Mobility Overal bed mobility: Needs Assistance Bed Mobility: Rolling;Sidelying to Sit Rolling: Supervision Sidelying to sit: Supervision       General bed mobility comments: Supervision for safety initially with cues for technique however pt did not require assist and is likely closer to mod I  Transfers Overall transfer level: Modified independent Equipment used: None             General transfer comment: No unsteadiness noted. Pt was able to power-up to full stand with minimal use of arms from bed. Arm rests used to stand from chair.   Ambulation/Gait Ambulation/Gait assistance: Modified independent (Device/Increase time) Ambulation Distance (Feet): 400 Feet Assistive device: None Gait Pattern/deviations:  Step-through pattern;Decreased stride length;Wide base of support Gait velocity: Decreased slightly Gait velocity interpretation: Below normal speed for age/gender General Gait Details: Slightly antalgic gait pattern but overall ambulating well. No unsteadiness noted.   Stairs Stairs: Yes Stairs assistance: Modified independent (Device/Increase time) Stair Management: One rail Right;Step to pattern;Forwards Number of Stairs: 10 General stair comments: Pt was able to negotiate 10 steps without assist. Supervision initially but pt mod I by end of stair training  Wheelchair Mobility    Modified Rankin (Stroke Patients Only)       Balance Overall balance assessment: No apparent balance deficits (not formally assessed)                                           Pertinent Vitals/Pain Pain Assessment: 0-10 Pain Score: 2  Pain Location: Back Pain Descriptors / Indicators: Operative site guarding Pain Intervention(s): Limited activity within patient's tolerance;Monitored during session;Repositioned    Home Living Family/patient expects to be discharged to:: Private residence Living Arrangements: Spouse/significant other Available Help at Discharge: Family;Available 24 hours/day Type of Home: House Home Access: Level entry     Home Layout: Two level Home Equipment: Toilet riser (with handles)      Prior Function Level of Independence: Independent               Hand Dominance   Dominant Hand: Right    Extremity/Trunk Assessment   Upper Extremity Assessment: Defer to OT evaluation           Lower Extremity Assessment: RLE deficits/detail RLE Deficits / Details: Decreased strength and muscle endurance  consistent with radiating pain prior to surgery.     Cervical / Trunk Assessment: Normal  Communication   Communication: No difficulties  Cognition Arousal/Alertness: Awake/alert Behavior During Therapy: WFL for tasks  assessed/performed Overall Cognitive Status: Within Functional Limits for tasks assessed                      General Comments      Exercises        Assessment/Plan    PT Assessment Patent does not need any further PT services  PT Diagnosis Acute pain   PT Problem List    PT Treatment Interventions     PT Goals (Current goals can be found in the Care Plan section) Acute Rehab PT Goals PT Goal Formulation: All assessment and education complete, DC therapy    Frequency     Barriers to discharge        Co-evaluation               End of Session Equipment Utilized During Treatment: Back brace Activity Tolerance: Patient tolerated treatment well Patient left: in chair;with call bell/phone within reach Nurse Communication: Mobility status         Time: 9507-2257 PT Time Calculation (min) (ACUTE ONLY): 24 min   Charges:   PT Evaluation $Initial PT Evaluation Tier I: 1 Procedure PT Treatments $Gait Training: 8-22 mins   PT G Codes:        Rolinda Roan Jan 08, 2015, 8:17 AM   Rolinda Roan, PT, DPT Acute Rehabilitation Services Pager: [phone]

## 2014-12-21 NOTE — Evaluation (Signed)
Occupational Therapy Evaluation Patient Details Name: James [name] MRN: [id number] DOB: [birth year] Today's Date: 12/21/2014    History of Present Illness Pt is a 64 y/o male who presents s/p PLIF L2-3 on 12/20/14.   Clinical Impression   Pt is at min A level with LB ADLs and is Mod I with functional mobility. All education competed and no further acute OT services indicated at this time    Follow Up Recommendations  No OT follow up    Equipment Recommendations  Tub/shower seat , pt declined ADL A/E education   Recommendations for Other Services       Precautions / Restrictions Precautions Precautions: Fall;Back Precaution Comments: reviewed back precations with pt, pt able to recall 3/3 Required Braces or Orthoses: Spinal Brace Spinal Brace: Lumbar corset;Applied in sitting position Restrictions Weight Bearing Restrictions: No      Mobility Bed Mobility Overal bed mobility: Needs Assistance Bed Mobility: Sit to Sidelying;Rolling Rolling: Supervision       Sit to sidelying: Supervision General bed mobility comments: Supervision for safety initially with cues for technique however pt did not require assist and is likely closer to mod I  Transfers Overall transfer level: Modified independent Equipment used: None             General transfer comment: No unsteadiness noted. Pt was able to power-up to full stand with minimal use of arms from bed. Arm rests used to stand from chair.     Balance Overall balance assessment: No apparent balance deficits (not formally assessed)                                          ADL Overall ADL's : Needs assistance/impaired     Grooming: Wash/dry hands;Wash/dry face;Standing;Modified independent   Upper Body Bathing: Modified independent;Standing   Lower Body Bathing: Minimal assistance   Upper Body Dressing : Modified independent   Lower Body Dressing: Minimal assistance   Toilet Transfer: Modified  Independent   Toileting- Clothing Manipulation and Hygiene: Modified independent   Tub/ Shower Transfer: Modified independent   Functional mobility during ADLs: Modified independent General ADL Comments: pt declined ADL A/E education and demo stating that his wife will assist him     Vision  reading glasses   Perception Perception Perception Tested?: No   Praxis Praxis Praxis tested?: Not tested    Pertinent Vitals/Pain Pain Assessment: 0-10 Pain Score: 2  Pain Location: back Pain Descriptors / Indicators: Operative site guarding Pain Intervention(s): Monitored during session;Premedicated before session;Repositioned     Hand Dominance Right   Extremity/Trunk Assessment Upper Extremity Assessment Upper Extremity Assessment: Overall WFL for tasks assessed   Lower Extremity Assessment Lower Extremity Assessment: Defer to PT evaluation   Cervical / Trunk Assessment Cervical / Trunk Assessment: Normal   Communication Communication Communication: No difficulties   Cognition Arousal/Alertness: Awake/alert Behavior During Therapy: WFL for tasks assessed/performed Overall Cognitive Status: Within Functional Limits for tasks assessed                                        Home Living Family/patient expects to be discharged to:: Private residence Living Arrangements: Spouse/significant other Available Help at Discharge: Family;Available 24 hours/day Type of Home: House Home Access: Level entry     Home Layout: Two level Alternate Level Stairs-Number  of Steps: 12 - 6, landing, 6 Alternate Level Stairs-Rails: Right;Left;Can reach both Bathroom Shower/Tub: Tub/shower unit;Walk-in Psychologist, prison and probation services: Standard     Home Equipment: Toilet riser   Additional Comments: pt declined ADL A/E edication and demo      Prior Functioning/Environment Level of Independence: Independent             OT Diagnosis: Acute pain   OT Problem List: Pain    OT Treatment/Interventions:      OT Goals(Current goals can be found in the care plan section) Acute Rehab OT Goals Patient Stated Goal: go home today OT Goal Formulation: With patient  OT Frequency:     Barriers to D/C:  none                        End of Session    Activity Tolerance: Patient tolerated treatment well Patient left: in bed;with call bell/phone within reach   Time: 0911-0928 OT Time Calculation (min): 17 min Charges:  OT General Charges $OT Visit: 1 Procedure OT Evaluation $Initial OT Evaluation Tier I: 1 Procedure G-Codes:    Britt Bottom 12/21/2014, 1:02 PM

## 2015-01-17 ENCOUNTER — Other Ambulatory Visit: Payer: Self-pay | Admitting: Family Medicine

## 2015-01-18 ENCOUNTER — Other Ambulatory Visit: Payer: Self-pay | Admitting: Family Medicine

## 2015-04-14 ENCOUNTER — Other Ambulatory Visit: Payer: Self-pay | Admitting: Family Medicine

## 2015-04-23 ENCOUNTER — Encounter: Payer: Self-pay | Admitting: Internal Medicine

## 2015-06-10 ENCOUNTER — Telehealth: Payer: Self-pay | Admitting: Family Medicine

## 2015-06-10 NOTE — Telephone Encounter (Signed)
Pt called stated that he was inform that Dr. Sherren Mocha is retiring and no one at that location is taking transfer pt at the moment. Pt wants to see Dr. Sarajane Jews but unable to. Please check and call pt back

## 2015-06-11 ENCOUNTER — Telehealth: Payer: Self-pay | Admitting: Family Medicine

## 2015-06-11 NOTE — Telephone Encounter (Signed)
Patient is looking to transfer to [facility] because of dr todd winding down his practice  Dr Sherren Mocha, is this ok with you?  Dr burns, is this ok with you?

## 2015-06-12 NOTE — Telephone Encounter (Signed)
Ok with me 

## 2015-06-12 NOTE — Telephone Encounter (Signed)
Pt would like to transfer to dr burns. Pt decline to see another provider at [facility]

## 2015-06-12 NOTE — Telephone Encounter (Signed)
Forwarding to [employer]

## 2015-06-17 NOTE — Telephone Encounter (Signed)
Pt would like to get cpe and labs done in May. Pt would like to see if he can see Dr Quay Burow in may, because he wants to see if he can cut back on some of his meds  I advised pt I would ask, but Dr Sherren Mocha will refills his meds if he needs. Pt was told no appointments until June. Is there any hope?

## 2015-06-24 ENCOUNTER — Other Ambulatory Visit (INDEPENDENT_AMBULATORY_CARE_PROVIDER_SITE_OTHER): Payer: BLUE CROSS/BLUE SHIELD

## 2015-06-24 ENCOUNTER — Ambulatory Visit (INDEPENDENT_AMBULATORY_CARE_PROVIDER_SITE_OTHER): Payer: BLUE CROSS/BLUE SHIELD | Admitting: Internal Medicine

## 2015-06-24 ENCOUNTER — Encounter: Payer: Self-pay | Admitting: Internal Medicine

## 2015-06-24 VITALS — BP 112/74 | HR 66 | Temp 98.3°F | Resp 16 | Wt 239.0 lb

## 2015-06-24 DIAGNOSIS — Z0000 Encounter for general adult medical examination without abnormal findings: Secondary | ICD-10-CM

## 2015-06-24 DIAGNOSIS — K219 Gastro-esophageal reflux disease without esophagitis: Secondary | ICD-10-CM | POA: Diagnosis not present

## 2015-06-24 DIAGNOSIS — R739 Hyperglycemia, unspecified: Secondary | ICD-10-CM

## 2015-06-24 DIAGNOSIS — E785 Hyperlipidemia, unspecified: Secondary | ICD-10-CM

## 2015-06-24 DIAGNOSIS — I10 Essential (primary) hypertension: Secondary | ICD-10-CM

## 2015-06-24 LAB — BASIC METABOLIC PANEL
BUN: 19 mg/dL (ref 6–23)
CO2: 32 mEq/L (ref 19–32)
Calcium: 9.3 mg/dL (ref 8.4–10.5)
Chloride: 98 mEq/L (ref 96–112)
Creatinine, Ser: 0.99 mg/dL (ref 0.40–1.50)
GFR: 81.3 mL/min (ref >60.00)
Glucose, Bld: 116 mg/dL — ABNORMAL HIGH (ref 70–99)
Potassium: 3.1 mEq/L — ABNORMAL LOW (ref 3.5–5.1)
Sodium: 137 mEq/L (ref 135–145)

## 2015-06-24 MED ORDER — ATENOLOL-CHLORTHALIDONE 50-25 MG PO TABS: 1.0000 | ORAL_TABLET | Freq: Every day | ORAL | Status: DC

## 2015-06-24 MED ORDER — SIMVASTATIN 40 MG PO TABS: 40.0000 mg | ORAL_TABLET | Freq: Every day | ORAL | Status: DC

## 2015-06-24 MED ORDER — POTASSIUM CHLORIDE CRYS ER 20 MEQ PO TBCR: 20.0000 meq | EXTENDED_RELEASE_TABLET | Freq: Every day | ORAL | Status: DC

## 2015-06-24 MED ORDER — DOXAZOSIN MESYLATE 8 MG PO TABS: 8.0000 mg | ORAL_TABLET | Freq: Every day | ORAL | Status: DC

## 2015-06-24 MED ORDER — OMEPRAZOLE 40 MG PO CPDR: 40.0000 mg | DELAYED_RELEASE_CAPSULE | Freq: Every day | ORAL | Status: DC

## 2015-06-24 NOTE — Telephone Encounter (Signed)
Pt states his meds run out first of May. Can pt get an appointment to see Dr Quay Burow by then?

## 2015-06-24 NOTE — Patient Instructions (Signed)
  Test(s) ordered today. Your results will be released to Erin (or called to you) after review, usually within 72hours after test completion. If any changes need to be made, you will be notified at that same time.    Medications reviewed and updated.  No changes recommended at this time.  You can try to taper off the omeprazole.   Your prescription(s) have been submitted to your pharmacy. Please take as directed and contact our office if you believe you are having problem(s) with the medication(s).   Please followup in  In about 6 months for a physical

## 2015-06-24 NOTE — Assessment & Plan Note (Signed)
Try to taper off medication

## 2015-06-24 NOTE — Progress Notes (Signed)
Subjective:    Patient ID: James [name], male    DOB: 01-02-1951, 65 y.o.   MRN: [id number]  HPI He is here to establish with a new pcp.    Hypertension: He is taking his medication daily. He is compliant with a low sodium diet.  He denies chest pain, palpitations, edema, shortness of breath and regular headaches. He is exercising regularly - walks 2 miles 5 days a week.  He does not monitor his blood pressure at home.    Hyperlipidemia: He is taking his medication daily. He is compliant with a low fat/cholesterol diet. He is exercising regularly. He denies myalgias.   GERD:  He is taking his medication daily as prescribed.  He denies any GERD symptoms and feels his GERD is well controlled.    Medications and allergies reviewed with patient and updated if appropriate.  Patient Active Problem List   Diagnosis Date Noted  . Lumbar spondylosis 12/20/2014  . Hyperlipidemia 04/07/2007  . Essential hypertension 04/07/2007  . GERD 04/07/2007    Current Outpatient Prescriptions on File Prior to Visit  Medication Sig Dispense Refill  . aspirin 81 MG tablet Take 81 mg by mouth daily.      Marland Kitchen atenolol-chlorthalidone (TENORETIC) 50-25 MG tablet TAKE 1 TABLET BY MOUTH ONCE DAILY 100 tablet 2  . doxazosin (CARDURA) 8 MG tablet TAKE 1 TABLET BY MOUTH AT BEDTIME 100 tablet 2  . KLOR-CON M20 20 MEQ tablet TAKE 1 TABLET BY MOUTH ONCE DAILY 100 tablet 0  . omeprazole (PRILOSEC) 40 MG capsule TAKE ONE CAPSULE BY MOUTH ONCE DAILY 100 capsule 0  . simvastatin (ZOCOR) 40 MG tablet Take 1 tablet (40 mg total) by mouth at bedtime. 100 tablet 3   No current facility-administered medications on file prior to visit.    Past Medical History  Diagnosis Date  . GERD (gastroesophageal reflux disease)   . Hyperlipidemia   . Hypertension   . Hemorrhoids   . Heart murmur   . Shortness of breath   . Angina   . History of stress test 2013    told it was normal  . Anxiety     especially revolving around  surgery & medical care   . Neuromuscular disorder     spinal stenosis     Past Surgical History  Procedure Laterality Date  . Appendectomy    . Testicle torsion reduction    . Cervical disc surgery    . Lumbar laminectomy  1990's    x2  . Upper gastrointestinal endoscopy    . Colonoscopy    . Eye surgery  09&12/2009    x3- (total of 7 eye surgery)     Social History   Social History  . Marital Status: Married    Spouse Name: N/A  . Number of Children: N/A  . Years of Education: N/A   Social History Main Topics  . Smoking status: Never Smoker   . Smokeless tobacco: Never Used  . Alcohol Use: Yes     Comment: 1 bottle wine a week, on weekends   . Drug Use: Yes    Special: Marijuana     Comment: recreationally- early August- 2016  . Sexual Activity: Yes   Other Topics Concern  . Not on file   Social History Narrative   [employer] driver    No tobacco   No ets    Married    Wife works Scientist, research (life sciences)    Family History  Problem  Relation Age of Onset  . Cancer Mother     skin cancer/melanoma  . Hypertension Father   . Heart disease Father   . Colon cancer Neg Hx     Review of Systems  Constitutional: Negative for fever.  Respiratory: Negative for cough, shortness of breath and wheezing.   Cardiovascular: Negative for chest pain, palpitations and leg swelling.  Gastrointestinal: Positive for abdominal pain (right lower abd).  Neurological: Negative for dizziness, light-headedness and headaches.       Objective:   Filed Vitals:   06/24/15 1504  BP: 112/74  Pulse: 66  Temp: 98.3 F (36.8 C)  Resp: 16   Filed Weights   06/24/15 1504  Weight: 239 lb (108.41 kg)   Body mass index is 29.1 kg/(m^2).   Physical Exam Constitutional: Appears well-developed and well-nourished. No distress.  Neck: Neck supple. No tracheal deviation present. No thyromegaly present.  No carotid bruit. No cervical adenopathy.   Cardiovascular: Normal rate, regular  rhythm and normal heart sounds.   No murmur heard.  No edema Pulmonary/Chest: Effort normal and breath sounds normal. No respiratory distress. No wheezes.       Assessment & Plan:    Hypokalemia Related to chlorthalidone Check bmp today   See Problem List for Assessment and Plan of chronic medical problems.  F/u in 6 months for a PE

## 2015-06-24 NOTE — Progress Notes (Signed)
Pre visit review using our clinic review tool, if applicable. No additional management support is needed unless otherwise documented below in the visit note. 

## 2015-06-24 NOTE — Assessment & Plan Note (Signed)
On simvastatin Will check lipids in 6 months for his PE

## 2015-06-24 NOTE — Assessment & Plan Note (Signed)
BP well controlled Current regimen effective and well tolerated Continue current medications at current doses Check bmp

## 2015-06-25 ENCOUNTER — Other Ambulatory Visit: Payer: Self-pay | Admitting: Emergency Medicine

## 2015-06-25 MED ORDER — POTASSIUM CHLORIDE CRYS ER 20 MEQ PO TBCR: EXTENDED_RELEASE_TABLET | ORAL | Status: DC

## 2015-06-25 NOTE — Telephone Encounter (Signed)
My apologies for no response. I spoke to the patient yesterday and got him in with Dr Quay Burow. We have now formally transferred him. Thank you for your diligence with this patient, Juliann Pulse!

## 2015-06-26 NOTE — Telephone Encounter (Signed)
I appreciate your help as well. Thank you!

## 2016-01-13 ENCOUNTER — Other Ambulatory Visit: Payer: Self-pay | Admitting: Emergency Medicine

## 2016-01-13 ENCOUNTER — Other Ambulatory Visit (INDEPENDENT_AMBULATORY_CARE_PROVIDER_SITE_OTHER): Payer: BLUE CROSS/BLUE SHIELD

## 2016-01-13 DIAGNOSIS — Z1159 Encounter for screening for other viral diseases: Secondary | ICD-10-CM

## 2016-01-13 DIAGNOSIS — Z0000 Encounter for general adult medical examination without abnormal findings: Secondary | ICD-10-CM | POA: Diagnosis not present

## 2016-01-13 DIAGNOSIS — R739 Hyperglycemia, unspecified: Secondary | ICD-10-CM

## 2016-01-13 LAB — CBC WITH DIFFERENTIAL/PLATELET
Basophils Absolute: 0.1 10*3/uL (ref 0.0–0.1)
Basophils Relative: 0.8 % (ref 0.0–3.0)
Eosinophils Absolute: 0.4 10*3/uL (ref 0.0–0.7)
Eosinophils Relative: 5.8 % — ABNORMAL HIGH (ref 0.0–5.0)
HCT: 44.7 % (ref 39.0–52.0)
Hemoglobin: 15.1 g/dL (ref 13.0–17.0)
Lymphocytes Relative: 28.8 % (ref 12.0–46.0)
Lymphs Abs: 2 10*3/uL (ref 0.7–4.0)
MCHC: 33.8 g/dL (ref 30.0–36.0)
MCV: 87.5 fl (ref 78.0–100.0)
Monocytes Absolute: 0.4 10*3/uL (ref 0.1–1.0)
Monocytes Relative: 6.5 % (ref 3.0–12.0)
Neutro Abs: 4 10*3/uL (ref 1.4–7.7)
Neutrophils Relative %: 58.1 % (ref 43.0–77.0)
Platelets: 264 10*3/uL (ref 150.0–400.0)
RBC: 5.11 Mil/uL (ref 4.22–5.81)
RDW: 13.3 % (ref 11.5–15.5)
WBC: 6.9 10*3/uL (ref 4.0–10.5)

## 2016-01-13 LAB — LIPID PANEL
Cholesterol: 147 mg/dL (ref 0–200)
HDL: 45.8 mg/dL (ref >39.00)
LDL Cholesterol: 82 mg/dL (ref 0–99)
NonHDL: 101.23
Total CHOL/HDL Ratio: 3
Triglycerides: 97 mg/dL (ref 0.0–149.0)
VLDL: 19.4 mg/dL (ref 0.0–40.0)

## 2016-01-13 LAB — COMPREHENSIVE METABOLIC PANEL
ALT: 20 U/L (ref 0–53)
AST: 16 U/L (ref 0–37)
Albumin: 4.3 g/dL (ref 3.5–5.2)
Alkaline Phosphatase: 52 U/L (ref 39–117)
BUN: 19 mg/dL (ref 6–23)
CO2: 33 mEq/L — ABNORMAL HIGH (ref 19–32)
Calcium: 9.5 mg/dL (ref 8.4–10.5)
Chloride: 99 mEq/L (ref 96–112)
Creatinine, Ser: 1.03 mg/dL (ref 0.40–1.50)
GFR: 77.53 mL/min (ref >60.00)
Glucose, Bld: 108 mg/dL — ABNORMAL HIGH (ref 70–99)
Potassium: 3.1 mEq/L — ABNORMAL LOW (ref 3.5–5.1)
Sodium: 141 mEq/L (ref 135–145)
Total Bilirubin: 0.7 mg/dL (ref 0.2–1.2)
Total Protein: 6.9 g/dL (ref 6.0–8.3)

## 2016-01-13 LAB — TSH: TSH: 0.98 u[IU]/mL (ref 0.35–4.50)

## 2016-01-13 LAB — HEMOGLOBIN A1C: Hgb A1c MFr Bld: 5.8 % (ref 4.6–6.5)

## 2016-01-13 NOTE — Progress Notes (Unsigned)
Pt came for blood work and asked to add Hep C screening to blood work.

## 2016-01-14 LAB — HEPATITIS C ANTIBODY: HCV Ab: NEGATIVE

## 2016-01-27 ENCOUNTER — Encounter: Payer: Self-pay | Admitting: Internal Medicine

## 2016-01-27 ENCOUNTER — Ambulatory Visit (INDEPENDENT_AMBULATORY_CARE_PROVIDER_SITE_OTHER): Payer: BLUE CROSS/BLUE SHIELD | Admitting: Internal Medicine

## 2016-01-27 VITALS — BP 134/84 | HR 71 | Temp 98.6°F | Resp 16 | Ht 76.0 in | Wt 238.0 lb

## 2016-01-27 DIAGNOSIS — R739 Hyperglycemia, unspecified: Secondary | ICD-10-CM

## 2016-01-27 DIAGNOSIS — K219 Gastro-esophageal reflux disease without esophagitis: Secondary | ICD-10-CM

## 2016-01-27 DIAGNOSIS — E876 Hypokalemia: Secondary | ICD-10-CM | POA: Diagnosis not present

## 2016-01-27 DIAGNOSIS — E785 Hyperlipidemia, unspecified: Secondary | ICD-10-CM

## 2016-01-27 DIAGNOSIS — Z0000 Encounter for general adult medical examination without abnormal findings: Secondary | ICD-10-CM | POA: Diagnosis not present

## 2016-01-27 DIAGNOSIS — I10 Essential (primary) hypertension: Secondary | ICD-10-CM | POA: Diagnosis not present

## 2016-01-27 MED ORDER — POTASSIUM CHLORIDE CRYS ER 20 MEQ PO TBCR: EXTENDED_RELEASE_TABLET | ORAL | 3 refills | Status: DC

## 2016-01-27 MED ORDER — OMEPRAZOLE 40 MG PO CPDR: 40.0000 mg | DELAYED_RELEASE_CAPSULE | Freq: Every day | ORAL | 3 refills | Status: DC

## 2016-01-27 MED ORDER — SIMVASTATIN 40 MG PO TABS: 40.0000 mg | ORAL_TABLET | Freq: Every day | ORAL | 3 refills | Status: DC

## 2016-01-27 MED ORDER — DOXAZOSIN MESYLATE 8 MG PO TABS: 8.0000 mg | ORAL_TABLET | Freq: Every day | ORAL | 3 refills | Status: DC

## 2016-01-27 MED ORDER — ATENOLOL-CHLORTHALIDONE 50-25 MG PO TABS: 1.0000 | ORAL_TABLET | Freq: Every day | ORAL | 3 refills | Status: DC

## 2016-01-27 NOTE — Patient Instructions (Signed)
Test(s) ordered today. Your results will be released to [facility] (or called to you) after review, usually within 72hours after test completion. If any changes need to be made, you will be notified at that same time.  All other Health Maintenance issues reviewed.   All recommended immunizations and age-appropriate screenings are up-to-date or discussed.  No immunizations administered today.   Medications reviewed and updated.  Changes include increasing potassium to 4 pills daily  Your prescription(s) have been submitted to your pharmacy. Please take as directed and contact our office if you believe you are having problem(s) with the medication(s).  Please followup in one year  Health Maintenance, Male A healthy lifestyle and preventative care can promote health and wellness.  Maintain regular health, dental, and eye exams.  Eat a healthy diet. Foods like vegetables, fruits, whole grains, low-fat dairy products, and lean protein foods contain the nutrients you need and are low in calories. Decrease your intake of foods high in solid fats, added sugars, and salt. Get information about a proper diet from your health care provider, if necessary.  Regular physical exercise is one of the most important things you can do for your health. Most adults should get at least 150 minutes of moderate-intensity exercise (any activity that increases your heart rate and causes you to sweat) each week. In addition, most adults need muscle-strengthening exercises on 2 or more days a week.   Maintain a healthy weight. The body mass index (BMI) is a screening tool to identify possible weight problems. It provides an estimate of body fat based on height and weight. Your health care provider can find your BMI and can help you achieve or maintain a healthy weight. For males 20 years and older:  A BMI below 18.5 is considered underweight.  A BMI of 18.5 to 24.9 is normal.  A BMI of 25 to 29.9 is considered  overweight.  A BMI of 30 and above is considered obese.  Maintain normal blood lipids and cholesterol by exercising and minimizing your intake of saturated fat. Eat a balanced diet with plenty of fruits and vegetables. Blood tests for lipids and cholesterol should begin at age 29 and be repeated every 5 years. If your lipid or cholesterol levels are high, you are over age 21, or you are at high risk for heart disease, you may need your cholesterol levels checked more frequently.Ongoing high lipid and cholesterol levels should be treated with medicines if diet and exercise are not working.  If you smoke, find out from your health care provider how to quit. If you do not use tobacco, do not start.  Lung cancer screening is recommended for adults aged 65-80 years who are at high risk for developing lung cancer because of a history of smoking. A yearly low-dose CT scan of the lungs is recommended for people who have at least a 30-pack-year history of smoking and are current smokers or have quit within the past 15 years. A pack year of smoking is smoking an average of 1 pack of cigarettes a day for 1 year (for example, a 30-pack-year history of smoking could mean smoking 1 pack a day for 30 years or 2 packs a day for 15 years). Yearly screening should continue until the smoker has stopped smoking for at least 15 years. Yearly screening should be stopped for people who develop a health problem that would prevent them from having lung cancer treatment.  If you choose to drink alcohol, do not have more  than 2 drinks per day. One drink is considered to be 12 oz (360 mL) of beer, 5 oz (150 mL) of wine, or 1.5 oz (45 mL) of liquor.  Avoid the use of street drugs. Do not share needles with anyone. Ask for help if you need support or instructions about stopping the use of drugs.  High blood pressure causes heart disease and increases the risk of stroke. High blood pressure is more likely to develop in:  People  who have blood pressure in the end of the normal range (100-139/85-89 mm Hg).  People who are overweight or obese.  People who are African American.  If you are 19-43 years of age, have your blood pressure checked every 3-5 years. If you are 56 years of age or older, have your blood pressure checked every year. You should have your blood pressure measured twice--once when you are at a hospital or clinic, and once when you are not at a hospital or clinic. Record the average of the two measurements. To check your blood pressure when you are not at a hospital or clinic, you can use:  An automated blood pressure machine at a pharmacy.  A home blood pressure monitor.  If you are 79-83 years old, ask your health care provider if you should take aspirin to prevent heart disease.  Diabetes screening involves taking a blood sample to check your fasting blood sugar level. This should be done once every 3 years after age 41 if you are at a normal weight and without risk factors for diabetes. Testing should be considered at a younger age or be carried out more frequently if you are overweight and have at least 1 risk factor for diabetes.  Colorectal cancer can be detected and often prevented. Most routine colorectal cancer screening begins at the age of 20 and continues through age 76. However, your health care provider may recommend screening at an earlier age if you have risk factors for colon cancer. On a yearly basis, your health care provider may provide home test kits to check for hidden blood in the stool. A small camera at the end of a tube may be used to directly examine the colon (sigmoidoscopy or colonoscopy) to detect the earliest forms of colorectal cancer. Talk to your health care provider about this at age 47 when routine screening begins. A direct exam of the colon should be repeated every 5-10 years through age 8, unless early forms of precancerous polyps or small growths are found.  People  who are at an increased risk for hepatitis B should be screened for this virus. You are considered at high risk for hepatitis B if:  You were born in a country where hepatitis B occurs often. Talk with your health care provider about which countries are considered high risk.  Your parents were born in a high-risk country and you have not received a shot to protect against hepatitis B (hepatitis B vaccine).  You have HIV or AIDS.  You use needles to inject street drugs.  You live with, or have sex with, someone who has hepatitis B.  You are a man who has sex with other men (MSM).  You get hemodialysis treatment.  You take certain medicines for conditions like cancer, organ transplantation, and autoimmune conditions.  Hepatitis C blood testing is recommended for all people born from [birth year] through 1963 and any individual with known risk factors for hepatitis C.  Healthy men should no longer receive prostate-specific antigen (PSA)  blood tests as part of routine cancer screening. Talk to your health care provider about prostate cancer screening.  Testicular cancer screening is not recommended for adolescents or adult males who have no symptoms. Screening includes self-exam, a health care provider exam, and other screening tests. Consult with your health care provider about any symptoms you have or any concerns you have about testicular cancer.  Practice safe sex. Use condoms and avoid high-risk sexual practices to reduce the spread of sexually transmitted infections (STIs).  You should be screened for STIs, including gonorrhea and chlamydia if:  You are sexually active and are younger than 24 years.  You are older than 24 years, and your health care provider tells you that you are at risk for this type of infection.  Your sexual activity has changed since you were last screened, and you are at an increased risk for chlamydia or gonorrhea. Ask your health care provider if you are at  risk.  If you are at risk of being infected with HIV, it is recommended that you take a prescription medicine daily to prevent HIV infection. This is called pre-exposure prophylaxis (PrEP). You are considered at risk if:  You are a man who has sex with other men (MSM).  You are a heterosexual man who is sexually active with multiple partners.  You take drugs by injection.  You are sexually active with a partner who has HIV.  Talk with your health care provider about whether you are at high risk of being infected with HIV. If you choose to begin PrEP, you should first be tested for HIV. You should then be tested every 3 months for as long as you are taking PrEP.  Use sunscreen. Apply sunscreen liberally and repeatedly throughout the day. You should seek shade when your shadow is shorter than you. Protect yourself by wearing long sleeves, pants, a wide-brimmed hat, and sunglasses year round whenever you are outdoors.  Tell your health care provider of new moles or changes in moles, especially if there is a change in shape or color. Also, tell your health care provider if a mole is larger than the size of a pencil eraser.  A one-time screening for abdominal aortic aneurysm (AAA) and surgical repair of large AAAs by ultrasound is recommended for men aged 51-75 years who are current or former smokers.  Stay current with your vaccines (immunizations).   This information is not intended to replace advice given to you by your health care provider. Make sure you discuss any questions you have with your health care provider.   Document Released: 09/04/2007 Document Revised: 03/29/2014 Document Reviewed: 08/03/2010 Elsevier Interactive Patient Education [employer].

## 2016-01-27 NOTE — Assessment & Plan Note (Signed)
a1c 5.8 Dicussed risk for DM Continue regular exercise Advised weight loss

## 2016-01-27 NOTE — Assessment & Plan Note (Signed)
Lipids controlled Continue statin 

## 2016-01-27 NOTE — Progress Notes (Signed)
Subjective:    Patient ID: James [name], male    DOB: 05-Oct-1950, 65 y.o.   MRN: [id number]  HPI He is here for a physical exam.   He had his blood work done two weeks ago.   He has no concerns.   He walks two miles a day for 5 days a week.  Medications and allergies reviewed with patient and updated if appropriate.  Patient Active Problem List   Diagnosis Date Noted  . Hyperglycemia 06/24/2015  . Lumbar spondylosis 12/20/2014  . Hyperlipidemia 04/07/2007  . Essential hypertension 04/07/2007  . GERD 04/07/2007    Current Outpatient Prescriptions on File Prior to Visit  Medication Sig Dispense Refill  . aspirin 81 MG tablet Take 81 mg by mouth daily.      Marland Kitchen atenolol-chlorthalidone (TENORETIC) 50-25 MG tablet Take 1 tablet by mouth daily. 90 tablet 1  . doxazosin (CARDURA) 8 MG tablet Take 1 tablet (8 mg total) by mouth at bedtime. 90 tablet 1  . omeprazole (PRILOSEC) 40 MG capsule Take 1 capsule (40 mg total) by mouth daily. 90 capsule 1  . potassium chloride SA (KLOR-CON M20) 20 MEQ tablet Take 3 tablets by mouth daily 270 tablet 1  . simvastatin (ZOCOR) 40 MG tablet Take 1 tablet (40 mg total) by mouth at bedtime. 90 tablet 1   No current facility-administered medications on file prior to visit.     Past Medical History:  Diagnosis Date  . Angina   . Anxiety    especially revolving around surgery & medical care   . GERD (gastroesophageal reflux disease)   . Heart murmur   . Hemorrhoids   . History of stress test 2013   told it was normal  . Hyperlipidemia   . Hypertension   . Neuromuscular disorder ([place])    spinal stenosis   . Shortness of breath     Past Surgical History:  Procedure Laterality Date  . APPENDECTOMY    . CERVICAL DISC SURGERY    . COLONOSCOPY    . EYE SURGERY  09&12/2009   x3- (total of 7 eye surgery)   . LUMBAR LAMINECTOMY  1990's   x2  . TESTICLE TORSION REDUCTION    . UPPER GASTROINTESTINAL ENDOSCOPY      Social History    Social History  . Marital status: Married    Spouse name: N/A  . Number of children: N/A  . Years of education: N/A   Social History Main Topics  . Smoking status: Never Smoker  . Smokeless tobacco: Never Used  . Alcohol use Yes     Comment: 1 bottle wine a week, on weekends   . Drug use:     Types: Marijuana     Comment: recreationally- early August- 2016  . Sexual activity: Yes   Other Topics Concern  . None   Social History Narrative   [employer] driver    No tobacco   No ets    Married    Wife works Scientist, research (life sciences)    Family History  Problem Relation Age of Onset  . Cancer Mother     skin cancer/melanoma  . Hypertension Father   . Heart disease Father   . Colon cancer Neg Hx     Review of Systems  Constitutional: Negative for appetite change, chills, fatigue, fever and unexpected weight change.  HENT: Negative for hearing loss and tinnitus.   Eyes: Negative for visual disturbance.  Respiratory: Negative for cough, shortness of  breath and wheezing.   Cardiovascular: Negative for chest pain, palpitations and leg swelling.  Gastrointestinal: Negative for abdominal pain, blood in stool, constipation, diarrhea and nausea.  Genitourinary: Negative for difficulty urinating, dysuria and hematuria.  Musculoskeletal: Positive for arthralgias. Negative for back pain, joint swelling and myalgias.  Skin: Negative for color change and rash.  Neurological: Negative for dizziness, light-headedness and headaches.  Psychiatric/Behavioral: Negative for dysphoric mood. The patient is not nervous/anxious.        Objective:   Vitals:   01/27/16 0758  BP: 134/84  Pulse: 71  Resp: 16  Temp: 98.6 F (37 C)   Filed Weights   01/27/16 0758  Weight: 238 lb (108 kg)   Body mass index is 28.97 kg/m.   Physical Exam Constitutional: He appears well-developed and well-nourished. No distress.  HENT:  Head: Normocephalic and atraumatic.  Right Ear: External ear normal.   Left Ear: External ear normal.  Mouth/Throat: Oropharynx is clear and moist.  Normal ear canals and TM b/l  Eyes: Conjunctivae and EOM are normal.  Neck: Neck supple. No tracheal deviation present. No thyromegaly present.  No carotid bruit  Cardiovascular: Normal rate, regular rhythm, normal heart sounds and intact distal pulses.   No murmur heard. Pulmonary/Chest: Effort normal and breath sounds normal. No respiratory distress. He has no wheezes. He has no rales.  Abdominal: Soft. Bowel sounds are normal. He exhibits no distension. There is no tenderness.  Genitourinary: normal prostate without nodules Musculoskeletal: He exhibits no edema.  Lymphadenopathy:   He has no cervical adenopathy.  Skin: Skin is warm and dry. He is not diaphoretic.  Psychiatric: He has a normal mood and affect. His behavior is normal.         Assessment & Plan:   Physical exam: Screening blood work - reviewed Immunizations  Up to date  Colonoscopy  Up to date  Eye exams  Up to date  EKG - today - no change from prior Exercise - regular, continue Weight - advised weight loss Skin  - no concerns Substance abuse - uses marijuana occ  See Problem List for Assessment and Plan of chronic medical problems.  F/u annually

## 2016-01-27 NOTE — Assessment & Plan Note (Signed)
BP well controlled Current regimen effective and well tolerated Continue current medications at current doses  

## 2016-01-27 NOTE — Assessment & Plan Note (Signed)
Likely due to chlorthalidone Discussed with patient - he does not want to change medication - had side effects from many medications in the past Will continue Increase Klor-con to 4 pills daily EKG today

## 2016-01-27 NOTE — Progress Notes (Signed)
Pre visit review using our clinic review tool, if applicable. No additional management support is needed unless otherwise documented below in the visit note. 

## 2016-01-27 NOTE — Assessment & Plan Note (Addendum)
GERD controlled Continue medication every three days - decrease further if possible

## 2016-03-17 ENCOUNTER — Other Ambulatory Visit: Payer: Self-pay | Admitting: Emergency Medicine

## 2016-03-17 MED ORDER — OMEPRAZOLE 40 MG PO CPDR: 40.0000 mg | DELAYED_RELEASE_CAPSULE | Freq: Every day | ORAL | 1 refills | Status: DC

## 2016-12-21 IMAGING — RF DG LUMBAR SPINE 2-3V
1 series · 2 of 2 positions shown · non-contrast
Comparison: MRI of the lumbar spine 10/15/2014.

CLINICAL DATA: 63-year-old male status post L2-L3 PLIF.

EXAM:
DG C-ARM 61-120 MIN; LUMBAR SPINE - 2-3 VIEW

[Series 1: run · 2 of 2 slices shown]
[im 1/2]
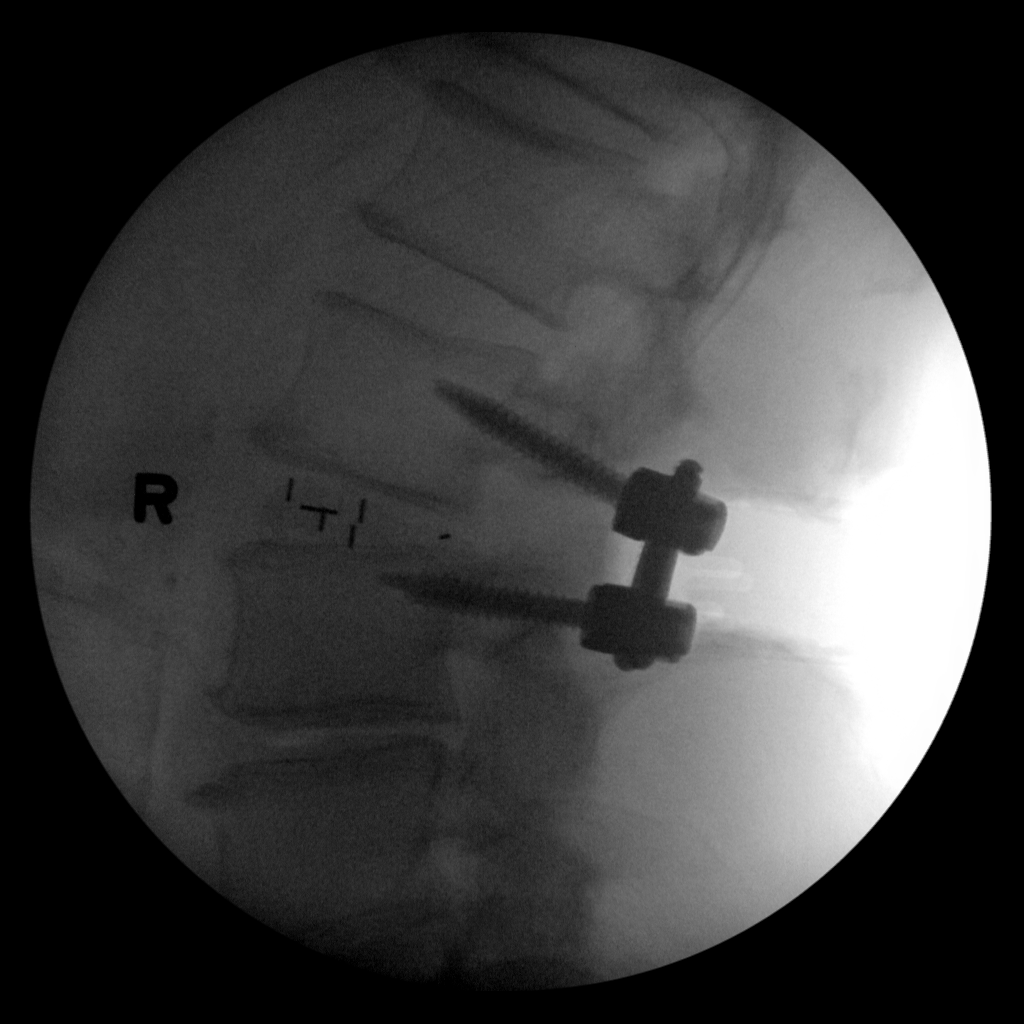
[im 2/2]
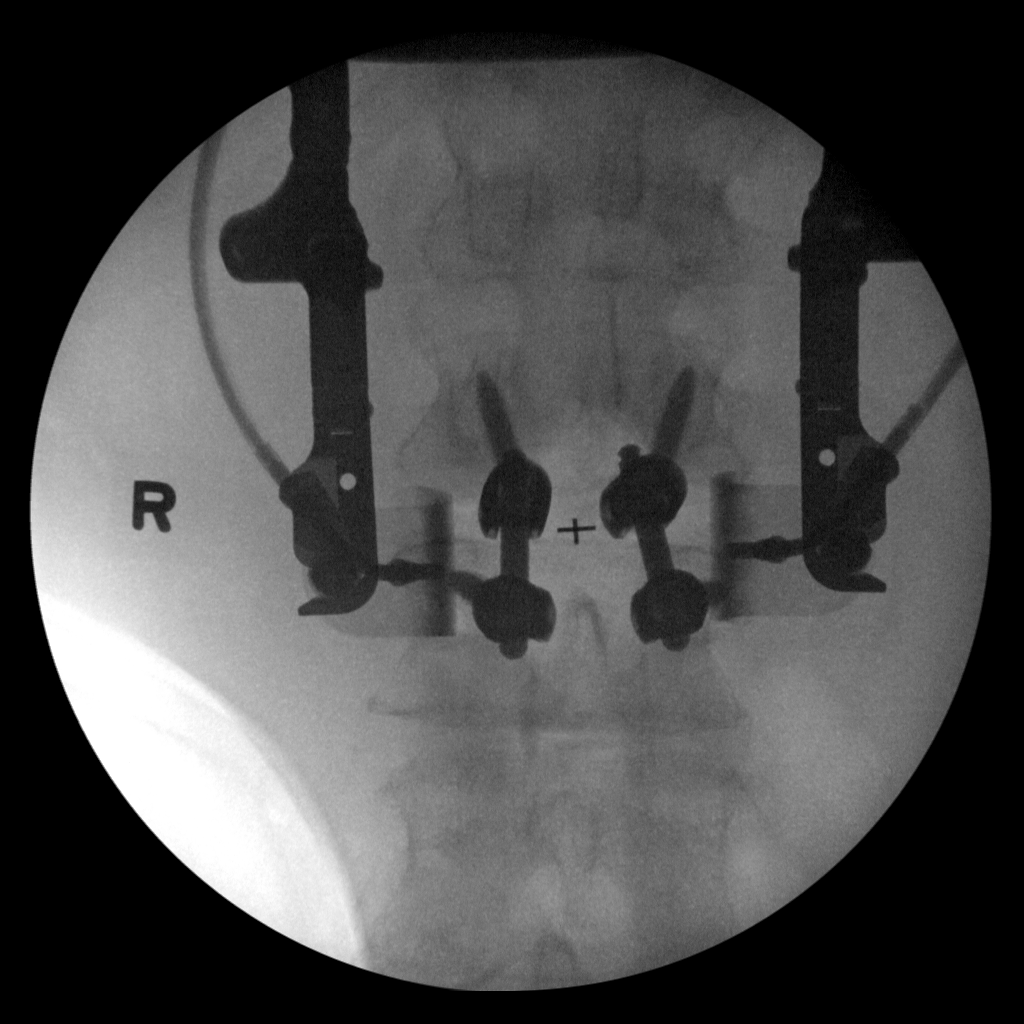

[2 of 2 positions shown; findings below may reference images not displayed]

FINDINGS: Two highly coned images of the lumbar spine are submitted for
evaluation, which appear to demonstrate posterior rod and pedicle
screw fixation device in place at L2-L3, with an interbody graft at
the L2-L3 interspace. Skin retractors are in place, with an open
wound posteriorly.
IMPRESSION: 1. Intraoperative documentation of PLIF at L2-L3, as above.

## 2017-01-10 ENCOUNTER — Telehealth: Payer: Self-pay | Admitting: Internal Medicine

## 2017-01-10 DIAGNOSIS — Z125 Encounter for screening for malignant neoplasm of prostate: Secondary | ICD-10-CM

## 2017-01-10 DIAGNOSIS — E785 Hyperlipidemia, unspecified: Secondary | ICD-10-CM

## 2017-01-10 DIAGNOSIS — I10 Essential (primary) hypertension: Secondary | ICD-10-CM

## 2017-01-10 DIAGNOSIS — R739 Hyperglycemia, unspecified: Secondary | ICD-10-CM

## 2017-01-10 NOTE — Telephone Encounter (Signed)
Pt called in and would like to go to lab the thur for cpe on 11/8  He wants a PSA  a1c Hep c

## 2017-01-11 NOTE — Telephone Encounter (Signed)
LVM informing pt, labs need to be fasting

## 2017-01-11 NOTE — Telephone Encounter (Signed)
signed

## 2017-01-11 NOTE — Telephone Encounter (Signed)
Orders pending. Please check DXs.

## 2017-01-13 ENCOUNTER — Other Ambulatory Visit (INDEPENDENT_AMBULATORY_CARE_PROVIDER_SITE_OTHER): Payer: BLUE CROSS/BLUE SHIELD

## 2017-01-13 DIAGNOSIS — E785 Hyperlipidemia, unspecified: Secondary | ICD-10-CM

## 2017-01-13 DIAGNOSIS — R739 Hyperglycemia, unspecified: Secondary | ICD-10-CM | POA: Diagnosis not present

## 2017-01-13 DIAGNOSIS — Z125 Encounter for screening for malignant neoplasm of prostate: Secondary | ICD-10-CM | POA: Diagnosis not present

## 2017-01-13 DIAGNOSIS — I10 Essential (primary) hypertension: Secondary | ICD-10-CM

## 2017-01-13 LAB — COMPREHENSIVE METABOLIC PANEL
ALT: 28 U/L (ref 0–53)
AST: 22 U/L (ref 0–37)
Albumin: 4.2 g/dL (ref 3.5–5.2)
Alkaline Phosphatase: 56 U/L (ref 39–117)
BUN: 18 mg/dL (ref 6–23)
CO2: 31 mEq/L (ref 19–32)
Calcium: 9.4 mg/dL (ref 8.4–10.5)
Chloride: 101 mEq/L (ref 96–112)
Creatinine, Ser: 1.01 mg/dL (ref 0.40–1.50)
GFR: 79.05 mL/min (ref >60.00)
Glucose, Bld: 112 mg/dL — ABNORMAL HIGH (ref 70–99)
Potassium: 3.6 mEq/L (ref 3.5–5.1)
Sodium: 141 mEq/L (ref 135–145)
Total Bilirubin: 0.6 mg/dL (ref 0.2–1.2)
Total Protein: 6.5 g/dL (ref 6.0–8.3)

## 2017-01-13 LAB — CBC WITH DIFFERENTIAL/PLATELET
Basophils Absolute: 0 10*3/uL (ref 0.0–0.1)
Basophils Relative: 0.7 % (ref 0.0–3.0)
Eosinophils Absolute: 0.4 10*3/uL (ref 0.0–0.7)
Eosinophils Relative: 5.3 % — ABNORMAL HIGH (ref 0.0–5.0)
HCT: 45.3 % (ref 39.0–52.0)
Hemoglobin: 15.1 g/dL (ref 13.0–17.0)
Lymphocytes Relative: 25.1 % (ref 12.0–46.0)
Lymphs Abs: 1.8 10*3/uL (ref 0.7–4.0)
MCHC: 33.4 g/dL (ref 30.0–36.0)
MCV: 88.2 fl (ref 78.0–100.0)
Monocytes Absolute: 0.5 10*3/uL (ref 0.1–1.0)
Monocytes Relative: 6.5 % (ref 3.0–12.0)
Neutro Abs: 4.5 10*3/uL (ref 1.4–7.7)
Neutrophils Relative %: 62.4 % (ref 43.0–77.0)
Platelets: 252 10*3/uL (ref 150.0–400.0)
RBC: 5.13 Mil/uL (ref 4.22–5.81)
RDW: 13.6 % (ref 11.5–15.5)
WBC: 7.2 10*3/uL (ref 4.0–10.5)

## 2017-01-13 LAB — LIPID PANEL
Cholesterol: 177 mg/dL (ref 0–200)
HDL: 57.2 mg/dL (ref >39.00)
LDL Cholesterol: 98 mg/dL (ref 0–99)
NonHDL: 119.49
Total CHOL/HDL Ratio: 3
Triglycerides: 108 mg/dL (ref 0.0–149.0)
VLDL: 21.6 mg/dL (ref 0.0–40.0)

## 2017-01-13 LAB — PSA: PSA: 1.78 ng/mL (ref 0.10–4.00)

## 2017-01-13 LAB — TSH: TSH: 1.72 u[IU]/mL (ref 0.35–4.50)

## 2017-01-16 ENCOUNTER — Other Ambulatory Visit: Payer: Self-pay | Admitting: Internal Medicine

## 2017-01-16 DIAGNOSIS — E785 Hyperlipidemia, unspecified: Secondary | ICD-10-CM

## 2017-01-26 NOTE — Progress Notes (Signed)
Subjective:    Patient ID: James [name], male    DOB: May 10, 1950, 66 y.o.   MRN: [id number]  HPI He is here for a physical exam.   He is walking 2 miles about 5 days a week.  Overall he feels well and has no concerns.  He just wants to make sure he should continue his current medication.  Medications and allergies reviewed with patient and updated if appropriate.  Patient Active Problem List   Diagnosis Date Noted  . Hypokalemia 01/27/2016  . Hyperglycemia 06/24/2015  . Lumbar spondylosis 12/20/2014  . Hyperlipidemia 04/07/2007  . Essential hypertension 04/07/2007  . GERD 04/07/2007    Current Outpatient Medications on File Prior to Visit  Medication Sig Dispense Refill  . aspirin 81 MG tablet Take 81 mg by mouth daily.      Marland Kitchen atenolol-chlorthalidone (TENORETIC) 50-25 MG tablet TAKE 1 TABLET BY MOUTH DAILY. 30 tablet 0  . doxazosin (CARDURA) 8 MG tablet TAKE 1 TABLET (8 MG TOTAL) BY MOUTH AT BEDTIME. 30 tablet 0  . KLOR-CON M20 20 MEQ tablet TAKE 2 TABLETS BY MOUTH TWICE DAILY 60 tablet 0  . omeprazole (PRILOSEC) 40 MG capsule Take 1 capsule (40 mg total) by mouth daily. 90 capsule 1  . simvastatin (ZOCOR) 40 MG tablet TAKE 1 TABLET (40 MG TOTAL) BY MOUTH AT BEDTIME. 30 tablet 0   No current facility-administered medications on file prior to visit.     Past Medical History:  Diagnosis Date  . Angina   . Anxiety    especially revolving around surgery & medical care   . GERD (gastroesophageal reflux disease)   . Heart murmur   . Hemorrhoids   . History of stress test 2013   told it was normal  . Hyperlipidemia   . Hypertension   . Neuromuscular disorder ([place])    spinal stenosis   . Shortness of breath     Past Surgical History:  Procedure Laterality Date  . APPENDECTOMY    . CERVICAL DISC SURGERY    . COLONOSCOPY    . EYE SURGERY  09&12/2009   x3- (total of 7 eye surgery)   . LUMBAR LAMINECTOMY  1990's   x2  . TESTICLE TORSION REDUCTION    . UPPER  GASTROINTESTINAL ENDOSCOPY      Social History   Socioeconomic History  . Marital status: Married    Spouse name: None  . Number of children: None  . Years of education: None  . Highest education level: None  Social Needs  . Financial resource strain: None  . Food insecurity - worry: None  . Food insecurity - inability: None  . Transportation needs - medical: None  . Transportation needs - non-medical: None  Occupational History  . None  Tobacco Use  . Smoking status: Never Smoker  . Smokeless tobacco: Never Used  Substance and Sexual Activity  . Alcohol use: Yes    Comment: 1 bottle wine a week, on weekends   . Drug use: Yes    Types: Marijuana    Comment: recreationally- early August- 2016  . Sexual activity: Yes  Other Topics Concern  . None  Social History Narrative   [employer] driver    No tobacco   No ets    Married    Wife works Scientist, research (life sciences)    Family History  Problem Relation Age of Onset  . Cancer Mother        skin cancer/melanoma  . Hypertension  Father   . Heart disease Father   . Colon cancer Neg Hx     Review of Systems  Constitutional: Negative for appetite change, chills, fatigue, fever and unexpected weight change.  Eyes: Negative for visual disturbance.  Respiratory: Negative for cough, shortness of breath and wheezing.   Cardiovascular: Negative for chest pain, palpitations and leg swelling.  Gastrointestinal: Negative for abdominal pain, blood in stool, constipation, diarrhea and nausea.       No gerd - controlled  Genitourinary: Negative for difficulty urinating, dysuria and hematuria.  Musculoskeletal: Negative for arthralgias and back pain.  Skin: Negative for color change and rash.  Neurological: Negative for dizziness, light-headedness and headaches.  Psychiatric/Behavioral: Negative for dysphoric mood. The patient is not nervous/anxious.        Objective:   Vitals:   01/27/17 1102  BP: 132/88  Pulse: 83  Resp: 16    Temp: 99 F (37.2 C)  SpO2: 98%   Filed Weights   01/27/17 1102  Weight: 238 lb (108 kg)   Body mass index is 28.97 kg/m.  Wt Readings from Last 3 Encounters:  01/27/17 238 lb (108 kg)  01/27/16 238 lb (108 kg)  06/24/15 239 lb (108.4 kg)     Physical Exam Constitutional: He appears well-developed and well-nourished. No distress.  HENT:  Head: Normocephalic and atraumatic.  Right Ear: External ear normal.  Left Ear: External ear normal.  Mouth/Throat: Oropharynx is clear and moist.  Normal ear canals and TM b/l  Eyes: Conjunctivae and EOM are normal.  Neck: Neck supple. No tracheal deviation present. No thyromegaly present.  No carotid bruit  Cardiovascular: Normal rate, regular rhythm, normal heart sounds and intact distal pulses.   No murmur heard. Pulmonary/Chest: Effort normal and breath sounds normal. No respiratory distress. He has no wheezes. He has no rales.  Abdominal: Soft. He exhibits no distension. There is no tenderness.  Genitourinary: Normal size prostate without nodules Musculoskeletal: He exhibits no edema.  Lymphadenopathy:   He has no cervical adenopathy.  Skin: Skin is warm and dry. He is not diaphoretic. dupuytren's contracture on left 4th finger Psychiatric: He has a normal mood and affect. His behavior is normal.         Assessment & Plan:   Physical exam: Screening blood work   reviewed Immunizations   Flu vaccine up to date , discussed shingrix Colonoscopy   Up to date  Eye exams  Up to date  EKG  Last done 01/2016 Exercise exercising regularly-continue Weight- will work on weight loss Skin no concerns    Substance abuse none   See Problem List for Assessment and Plan of chronic medical problems.  Follow-up annually

## 2017-01-26 NOTE — Patient Instructions (Addendum)
All other Health Maintenance issues reviewed.   All recommended immunizations and age-appropriate screenings are up-to-date or discussed.  No immunizations administered today.   Medications reviewed and updated.  No changes recommended at this time.  Your prescription(s) have been submitted to your pharmacy. Please take as directed and contact our office if you believe you are having problem(s) with the medication(s).  Please followup in one year    Health Maintenance, Male A healthy lifestyle and preventive care is important for your health and wellness. Ask your health care provider about what schedule of regular examinations is right for you. What should I know about weight and diet? Eat a Healthy Diet  Eat plenty of vegetables, fruits, whole grains, low-fat dairy products, and lean protein.  Do not eat a lot of foods high in solid fats, added sugars, or salt.  Maintain a Healthy Weight Regular exercise can help you achieve or maintain a healthy weight. You should:  Do at least 150 minutes of exercise each week. The exercise should increase your heart rate and make you sweat (moderate-intensity exercise).  Do strength-training exercises at least twice a week.  Watch Your Levels of Cholesterol and Blood Lipids  Have your blood tested for lipids and cholesterol every 5 years starting at 64 years of age. If you are at high risk for heart disease, you should start having your blood tested when you are 64 years old. You may need to have your cholesterol levels checked more often if: ? Your lipid or cholesterol levels are high. ? You are older than 64 years of age. ? You are at high risk for heart disease.  What should I know about cancer screening? Many types of cancers can be detected early and may often be prevented. Lung Cancer  You should be screened every year for lung cancer if: ? You are a current smoker who has smoked for at least 30 years. ? You are a former smoker  who has quit within the past 15 years.  Talk to your health care provider about your screening options, when you should start screening, and how often you should be screened.  Colorectal Cancer  Routine colorectal cancer screening usually begins at 64 years of age and should be repeated every 5-10 years until you are 64 years old. You may need to be screened more often if early forms of precancerous polyps or small growths are found. Your health care provider may recommend screening at an earlier age if you have risk factors for colon cancer.  Your health care provider may recommend using home test kits to check for hidden blood in the stool.  A small camera at the end of a tube can be used to examine your colon (sigmoidoscopy or colonoscopy). This checks for the earliest forms of colorectal cancer.  Prostate and Testicular Cancer  Depending on your age and overall health, your health care provider may do certain tests to screen for prostate and testicular cancer.  Talk to your health care provider about any symptoms or concerns you have about testicular or prostate cancer.  Skin Cancer  Check your skin from head to toe regularly.  Tell your health care provider about any new moles or changes in moles, especially if: ? There is a change in a mole's size, shape, or color. ? You have a mole that is larger than a pencil eraser.  Always use sunscreen. Apply sunscreen liberally and repeat throughout the day.  Protect yourself by wearing long sleeves,  pants, a wide-brimmed hat, and sunglasses when outside.  What should I know about heart disease, diabetes, and high blood pressure?  If you are 14-106 years of age, have your blood pressure checked every 3-5 years. If you are 49 years of age or older, have your blood pressure checked every year. You should have your blood pressure measured twice-once when you are at a hospital or clinic, and once when you are not at a hospital or clinic. Record  the average of the two measurements. To check your blood pressure when you are not at a hospital or clinic, you can use: ? An automated blood pressure machine at a pharmacy. ? A home blood pressure monitor.  Talk to your health care provider about your target blood pressure.  If you are between 72-69 years old, ask your health care provider if you should take aspirin to prevent heart disease.  Have regular diabetes screenings by checking your fasting blood sugar level. ? If you are at a normal weight and have a low risk for diabetes, have this test once every three years after the age of 87. ? If you are overweight and have a high risk for diabetes, consider being tested at a younger age or more often.  A one-time screening for abdominal aortic aneurysm (AAA) by ultrasound is recommended for men aged 25-75 years who are current or former smokers. What should I know about preventing infection? Hepatitis B If you have a higher risk for hepatitis B, you should be screened for this virus. Talk with your health care provider to find out if you are at risk for hepatitis B infection. Hepatitis C Blood testing is recommended for:  Everyone born from [birth year] through 1963.  Anyone with known risk factors for hepatitis C.  Sexually Transmitted Diseases (STDs)  You should be screened each year for STDs including gonorrhea and chlamydia if: ? You are sexually active and are younger than 64 years of age. ? You are older than 64 years of age and your health care provider tells you that you are at risk for this type of infection. ? Your sexual activity has changed since you were last screened and you are at an increased risk for chlamydia or gonorrhea. Ask your health care provider if you are at risk.  Talk with your health care provider about whether you are at high risk of being infected with HIV. Your health care provider may recommend a prescription medicine to help prevent HIV infection.  What else  can I do?  Schedule regular health, dental, and eye exams.  Stay current with your vaccines (immunizations).  Do not use any tobacco products, such as cigarettes, chewing tobacco, and e-cigarettes. If you need help quitting, ask your health care provider.  Limit alcohol intake to no more than 2 drinks per day. One drink equals 12 ounces of beer, 5 ounces of wine, or 1 ounces of hard liquor.  Do not use street drugs.  Do not share needles.  Ask your health care provider for help if you need support or information about quitting drugs.  Tell your health care provider if you often feel depressed.  Tell your health care provider if you have ever been abused or do not feel safe at home. This information is not intended to replace advice given to you by your health care provider. Make sure you discuss any questions you have with your health care provider. Document Released: 09/04/2007 Document Revised: 11/05/2015 Document Reviewed: 12/10/2014 Elsevier  Interactive Patient Education  [employer].

## 2017-01-27 ENCOUNTER — Encounter: Payer: Self-pay | Admitting: Internal Medicine

## 2017-01-27 ENCOUNTER — Ambulatory Visit (INDEPENDENT_AMBULATORY_CARE_PROVIDER_SITE_OTHER): Payer: BLUE CROSS/BLUE SHIELD | Admitting: Internal Medicine

## 2017-01-27 VITALS — BP 132/88 | HR 83 | Temp 99.0°F | Resp 16 | Ht 76.0 in | Wt 238.0 lb

## 2017-01-27 DIAGNOSIS — E119 Type 2 diabetes mellitus without complications: Secondary | ICD-10-CM | POA: Insufficient documentation

## 2017-01-27 DIAGNOSIS — Z0001 Encounter for general adult medical examination with abnormal findings: Secondary | ICD-10-CM | POA: Diagnosis not present

## 2017-01-27 DIAGNOSIS — R7303 Prediabetes: Secondary | ICD-10-CM

## 2017-01-27 DIAGNOSIS — M720 Palmar fascial fibromatosis [Dupuytren]: Secondary | ICD-10-CM | POA: Diagnosis not present

## 2017-01-27 DIAGNOSIS — E876 Hypokalemia: Secondary | ICD-10-CM | POA: Diagnosis not present

## 2017-01-27 DIAGNOSIS — E785 Hyperlipidemia, unspecified: Secondary | ICD-10-CM | POA: Diagnosis not present

## 2017-01-27 DIAGNOSIS — I10 Essential (primary) hypertension: Secondary | ICD-10-CM | POA: Diagnosis not present

## 2017-01-27 DIAGNOSIS — K219 Gastro-esophageal reflux disease without esophagitis: Secondary | ICD-10-CM | POA: Diagnosis not present

## 2017-01-27 MED ORDER — ATENOLOL-CHLORTHALIDONE 50-25 MG PO TABS: 1.0000 | ORAL_TABLET | Freq: Every day | ORAL | 3 refills | Status: DC

## 2017-01-27 MED ORDER — DOXAZOSIN MESYLATE 8 MG PO TABS: 8.0000 mg | ORAL_TABLET | Freq: Every day | ORAL | 3 refills | Status: DC

## 2017-01-27 MED ORDER — SIMVASTATIN 40 MG PO TABS: 40.0000 mg | ORAL_TABLET | Freq: Every day | ORAL | 3 refills | Status: DC

## 2017-01-27 MED ORDER — OMEPRAZOLE 40 MG PO CPDR: 40.0000 mg | DELAYED_RELEASE_CAPSULE | Freq: Every day | ORAL | 1 refills | Status: DC

## 2017-01-27 MED ORDER — POTASSIUM CHLORIDE CRYS ER 20 MEQ PO TBCR: 40.0000 meq | EXTENDED_RELEASE_TABLET | Freq: Two times a day (BID) | ORAL | 1 refills | Status: DC

## 2017-01-27 NOTE — Assessment & Plan Note (Signed)
BP well controlled Current regimen effective and well tolerated Continue current medications at current doses cmp  

## 2017-01-27 NOTE — Assessment & Plan Note (Signed)
Lipid panel well controlled Continue simvastatin

## 2017-01-27 NOTE — Assessment & Plan Note (Signed)
Sugars have been in prediabetic range A1c was not done, but fasting sugar in prediabetic range Low sugar/carbohydrate diet Stressed regular exercise

## 2017-01-27 NOTE — Assessment & Plan Note (Signed)
Potassium in normal range Continue current dose of potassium supplementation

## 2017-01-27 NOTE — Assessment & Plan Note (Signed)
GERD controlled Continue daily medication  

## 2017-01-27 NOTE — Assessment & Plan Note (Signed)
Contracture left hand Asymptomatic Advised that if symptoms worsen can refer to hand surgery

## 2017-04-27 ENCOUNTER — Other Ambulatory Visit: Payer: Self-pay | Admitting: Internal Medicine

## 2017-07-22 ENCOUNTER — Other Ambulatory Visit: Payer: Self-pay | Admitting: Internal Medicine

## 2018-01-23 ENCOUNTER — Encounter: Payer: Self-pay | Admitting: Internal Medicine

## 2018-01-25 ENCOUNTER — Other Ambulatory Visit: Payer: Self-pay

## 2018-01-25 DIAGNOSIS — E785 Hyperlipidemia, unspecified: Secondary | ICD-10-CM

## 2018-01-25 MED ORDER — DOXAZOSIN MESYLATE 8 MG PO TABS: 8.0000 mg | ORAL_TABLET | Freq: Every day | ORAL | 1 refills | Status: DC

## 2018-01-25 MED ORDER — POTASSIUM CHLORIDE CRYS ER 20 MEQ PO TBCR: EXTENDED_RELEASE_TABLET | ORAL | 1 refills | Status: DC

## 2018-01-25 MED ORDER — ATENOLOL-CHLORTHALIDONE 50-25 MG PO TABS: 1.0000 | ORAL_TABLET | Freq: Every day | ORAL | 1 refills | Status: DC

## 2018-01-25 MED ORDER — SIMVASTATIN 40 MG PO TABS: 40.0000 mg | ORAL_TABLET | Freq: Every day | ORAL | 1 refills | Status: DC

## 2018-03-28 ENCOUNTER — Other Ambulatory Visit: Payer: Self-pay | Admitting: Internal Medicine

## 2018-03-28 DIAGNOSIS — E785 Hyperlipidemia, unspecified: Secondary | ICD-10-CM

## 2018-04-04 ENCOUNTER — Telehealth: Payer: Self-pay

## 2018-04-04 DIAGNOSIS — E785 Hyperlipidemia, unspecified: Secondary | ICD-10-CM

## 2018-04-04 DIAGNOSIS — R7303 Prediabetes: Secondary | ICD-10-CM

## 2018-04-04 DIAGNOSIS — I10 Essential (primary) hypertension: Secondary | ICD-10-CM

## 2018-04-04 DIAGNOSIS — Z0000 Encounter for general adult medical examination without abnormal findings: Secondary | ICD-10-CM

## 2018-04-04 DIAGNOSIS — Z125 Encounter for screening for malignant neoplasm of prostate: Secondary | ICD-10-CM

## 2018-04-04 NOTE — Telephone Encounter (Signed)
The only openings we have for this week are same days and hospital follow ups. Please advise if you are ok to use these slots.

## 2018-04-04 NOTE — Telephone Encounter (Signed)
Copied from [place] [id number]. Topic: Appointment Scheduling - Scheduling Inquiry for Clinic >> Apr 04, 2018 10:59 AM Bea Graff, NT wrote: Reason for CRM: Pt would like to see if he can come in this week for CPE labs. Please advise pt.

## 2018-04-04 NOTE — Telephone Encounter (Signed)
I think he just wants labs - ordered - I do need to know if he is on medicare or not for billing of the labs.

## 2018-04-05 NOTE — Telephone Encounter (Signed)
Pt has medicare.

## 2018-04-06 ENCOUNTER — Other Ambulatory Visit (INDEPENDENT_AMBULATORY_CARE_PROVIDER_SITE_OTHER): Payer: Medicare Other

## 2018-04-06 DIAGNOSIS — Z125 Encounter for screening for malignant neoplasm of prostate: Secondary | ICD-10-CM | POA: Diagnosis not present

## 2018-04-06 DIAGNOSIS — R7303 Prediabetes: Secondary | ICD-10-CM

## 2018-04-06 DIAGNOSIS — H35372 Puckering of macula, left eye: Secondary | ICD-10-CM | POA: Diagnosis not present

## 2018-04-06 DIAGNOSIS — H35033 Hypertensive retinopathy, bilateral: Secondary | ICD-10-CM | POA: Diagnosis not present

## 2018-04-06 DIAGNOSIS — I10 Essential (primary) hypertension: Secondary | ICD-10-CM | POA: Diagnosis not present

## 2018-04-06 DIAGNOSIS — H25012 Cortical age-related cataract, left eye: Secondary | ICD-10-CM | POA: Diagnosis not present

## 2018-04-06 DIAGNOSIS — Z0000 Encounter for general adult medical examination without abnormal findings: Secondary | ICD-10-CM | POA: Diagnosis not present

## 2018-04-06 DIAGNOSIS — E785 Hyperlipidemia, unspecified: Secondary | ICD-10-CM | POA: Diagnosis not present

## 2018-04-06 DIAGNOSIS — H2512 Age-related nuclear cataract, left eye: Secondary | ICD-10-CM | POA: Diagnosis not present

## 2018-04-06 LAB — LIPID PANEL
Cholesterol: 161 mg/dL (ref 0–200)
HDL: 49 mg/dL (ref >39.00)
LDL Cholesterol: 94 mg/dL (ref 0–99)
NonHDL: 111.94
Total CHOL/HDL Ratio: 3
Triglycerides: 91 mg/dL (ref 0.0–149.0)
VLDL: 18.2 mg/dL (ref 0.0–40.0)

## 2018-04-06 LAB — PSA, MEDICARE: PSA: 1.63 ng/ml (ref 0.10–4.00)

## 2018-04-06 LAB — CBC WITH DIFFERENTIAL/PLATELET
Basophils Absolute: 0.1 10*3/uL (ref 0.0–0.1)
Basophils Relative: 0.9 % (ref 0.0–3.0)
Eosinophils Absolute: 0.3 10*3/uL (ref 0.0–0.7)
Eosinophils Relative: 4.6 % (ref 0.0–5.0)
HCT: 44 % (ref 39.0–52.0)
Hemoglobin: 15 g/dL (ref 13.0–17.0)
Lymphocytes Relative: 23.9 % (ref 12.0–46.0)
Lymphs Abs: 1.7 10*3/uL (ref 0.7–4.0)
MCHC: 34.1 g/dL (ref 30.0–36.0)
MCV: 86.5 fl (ref 78.0–100.0)
Monocytes Absolute: 0.5 10*3/uL (ref 0.1–1.0)
Monocytes Relative: 7.3 % (ref 3.0–12.0)
Neutro Abs: 4.5 10*3/uL (ref 1.4–7.7)
Neutrophils Relative %: 63.3 % (ref 43.0–77.0)
Platelets: 259 10*3/uL (ref 150.0–400.0)
RBC: 5.09 Mil/uL (ref 4.22–5.81)
RDW: 13.5 % (ref 11.5–15.5)
WBC: 7.1 10*3/uL (ref 4.0–10.5)

## 2018-04-06 LAB — COMPREHENSIVE METABOLIC PANEL
ALT: 19 U/L (ref 0–53)
AST: 18 U/L (ref 0–37)
Albumin: 4.3 g/dL (ref 3.5–5.2)
Alkaline Phosphatase: 64 U/L (ref 39–117)
BUN: 19 mg/dL (ref 6–23)
CO2: 31 mEq/L (ref 19–32)
Calcium: 9.7 mg/dL (ref 8.4–10.5)
Chloride: 101 mEq/L (ref 96–112)
Creatinine, Ser: 1.14 mg/dL (ref 0.40–1.50)
GFR: 64.42 mL/min (ref >60.00)
Glucose, Bld: 108 mg/dL — ABNORMAL HIGH (ref 70–99)
Potassium: 3.8 mEq/L (ref 3.5–5.1)
Sodium: 141 mEq/L (ref 135–145)
Total Bilirubin: 0.7 mg/dL (ref 0.2–1.2)
Total Protein: 6.7 g/dL (ref 6.0–8.3)

## 2018-04-06 LAB — HEMOGLOBIN A1C: Hgb A1c MFr Bld: 6 % (ref 4.6–6.5)

## 2018-04-06 LAB — TSH: TSH: 1.46 u[IU]/mL (ref 0.35–4.50)

## 2018-04-11 NOTE — Progress Notes (Signed)
Subjective:    Patient ID: James [name], male    DOB: 1950-05-11, 68 y.o.   MRN: [id number]  HPI Here for medicare wellness exam.   I have personally reviewed and have noted 1.The patient's medical and social history 2.Their use of alcohol, tobacco or illicit drugs 3.Their current medications and supplements 4.The patient's functional ability including ADL's, fall risks, home                 safety risk and hearing or visual impairment. 5.Diet and physical activities 6.Evidence for depression or mood disorders 7.Care team reviewed  -  Eye doctor - Dr Zadie Rhine, Dr Herbert Deaner   For about one month he has been coughing.  He denies cold symptoms.  He had this in the past after a cold, but this is not the cause.  He has been coughing after he eats or first thing in the morning and then he will vomit white stuff. He does not vomit any food and it does not look like mucus.  He has been doing this three times a day.  After he vomits he feels fine.  He got sick this morning.  He did not eat lunch.today.  He did not vomited only once..      He has GERD.  He did decrease the prilosec since he was here last - he was taking it twice a week.  For a while he took it daily and was able to get down to one time 2-3 times a week.  With his stomach symptoms he started one a day.  He denies GERD, reflux.  He feels nauseous at times and has increased burping.  He thinks the prilosec has helped.    He has no other concerns.   Are there smokers in your home (other than you)? No  Risk Factors Exercise: walking regularly Dietary issues discussed:   Not well balanced, will work on decreased portions, will decrease meat and fried food,  Decrease sweet  Vitamin and supplement use:  no  Opiod use:   none   Cardiac risk factors: advanced age, hypertension, hyperlipidemia, and obesity .  Depression Screen  Have you felt down, depressed or hopeless?  No  Have you felt little interest or pleasure in doing things?  No  Activities of Daily Living In your present state of health, do you have any difficulty performing the following activities?:  Driving? No Managing money?  No Feeding yourself? No Getting from bed to chair? No Climbing a flight of stairs? No Preparing food and eating?: No Bathing or showering? No Getting dressed: No Getting to/using the toilet? No Moving around from place to place: No In the past year have you fallen or had a near fall?: No    Do you have more than one partner?  No   Hearing Difficulties: No Do you often ask people to speak up or repeat themselves? No Do you experience ringing or noises in your ears? No Do you have difficulty understanding soft or whispered voices? No Vision:              Any change in vision:  no             Up to date with eye exam:  yes   Memory:  Do you feel that you have a problem with memory? No  Do you often misplace items? No  Do you feel safe at home?  Yes  Cognitive Testing  Alert, Orientated? Yes  Normal  Appearance? Yes  Recall of three objects?  Yes  Can perform simple calculations? Yes  Displays appropriate judgment? Yes  Can read the correct time from a watch face? Yes   Advanced Directives have been discussed with the patient? Yes     Medications and allergies reviewed with patient and updated if appropriate.  Patient Active Problem List   Diagnosis Date Noted  . Dupuytren's contracture of left hand 01/27/2017  . Prediabetes 01/27/2017  . Hypokalemia 01/27/2016  . Lumbar spondylosis 12/20/2014  . Hyperlipidemia 04/07/2007  . Essential hypertension 04/07/2007  . GERD 04/07/2007    No current outpatient medications on file prior to visit.   No current facility-administered medications on file prior to visit.     Past Medical History:  Diagnosis Date  . Angina   . Anxiety    especially revolving around surgery & medical care   . GERD  (gastroesophageal reflux disease)   . Heart murmur   . Hemorrhoids   . History of stress test 2013   told it was normal  . Hyperlipidemia   . Hypertension   . Neuromuscular disorder ([facility])    spinal stenosis   . Shortness of breath     Past Surgical History:  Procedure Laterality Date  . APPENDECTOMY    . CERVICAL DISC SURGERY    . COLONOSCOPY    . EYE SURGERY  09&12/2009   x3- (total of 7 eye surgery)   . LUMBAR LAMINECTOMY  1990's   x2  . TESTICLE TORSION REDUCTION    . UPPER GASTROINTESTINAL ENDOSCOPY      Social History   Socioeconomic History  . Marital status: Married    Spouse name: Not on file  . Number of children: Not on file  . Years of education: Not on file  . Highest education level: Not on file  Occupational History  . Not on file  Social Needs  . Financial resource strain: Not on file  . Food insecurity:    Worry: Not on file    Inability: Not on file  . Transportation needs:    Medical: Not on file    Non-medical: Not on file  Tobacco Use  . Smoking status: Never Smoker  . Smokeless tobacco: Never Used  Substance and Sexual Activity  . Alcohol use: Yes    Comment: 1 bottle wine a week, on weekends   . Drug use: Yes    Types: Marijuana    Comment: recreationally- early August- 2016  . Sexual activity: Yes  Lifestyle  . Physical activity:    Days per week: Not on file    Minutes per session: Not on file  . Stress: Not on file  Relationships  . Social connections:    Talks on phone: Not on file    Gets together: Not on file    Attends religious service: Not on file    Active member of club or organization: Not on file    Attends meetings of clubs or organizations: Not on file    Relationship status: Not on file  Other Topics Concern  . Not on file  Social History Narrative   [employer] driver    No tobacco   No ets    Married    Wife works Scientist, research (life sciences)    Family History  Problem Relation Age of Onset  . Cancer Mother         skin cancer/melanoma  . Hypertension Father   . Heart disease Father   .  Colon cancer Neg Hx     Review of Systems  Constitutional: Negative for chills and fever.  HENT: Negative for hearing loss, tinnitus and trouble swallowing.   Eyes: Negative for visual disturbance.  Respiratory: Positive for cough (after eating). Negative for shortness of breath and wheezing.   Cardiovascular: Negative for chest pain, palpitations and leg swelling.  Gastrointestinal: Positive for nausea (with episode) and vomiting. Negative for abdominal pain, blood in stool, constipation and diarrhea.  Genitourinary: Negative for difficulty urinating, dysuria and hematuria.  Musculoskeletal: Negative for arthralgias and back pain.  Skin: Negative for color change and rash.  Neurological: Negative for light-headedness and headaches.  Psychiatric/Behavioral: Negative for dysphoric mood. The patient is not nervous/anxious.        Objective:   Vitals:   04/12/18 1340  BP: 124/78  Pulse: 75  Resp: 16  Temp: 98.8 F (37.1 C)  SpO2: 98%   Filed Weights   04/12/18 1340  Weight: 249 lb 12.8 oz (113.3 kg)   Body mass index is 30.41 kg/m.  Wt Readings from Last 3 Encounters:  04/12/18 249 lb 12.8 oz (113.3 kg)  01/27/17 238 lb (108 kg)  01/27/16 238 lb (108 kg)     Physical Exam Constitutional: He appears well-developed and well-nourished. No distress.  HENT:  Head: Normocephalic and atraumatic.  Right Ear: External ear normal.  Left Ear: External ear normal.  Mouth/Throat: Oropharynx is clear and moist.  Normal ear canals and TM b/l  Eyes: Conjunctivae and EOM are normal.  Neck: Neck supple. No tracheal deviation present. No thyromegaly present. No carotid bruit  Cardiovascular: Normal rate, regular rhythm, normal heart sounds and intact distal pulses.  No murmur heard. Pulmonary/Chest: Effort normal and breath sounds normal. No respiratory distress. He has no wheezes. He has no rales.    Abdominal: Soft. He exhibits no distension. There is no tenderness.  Genitourinary: deferred  Musculoskeletal: He exhibits no edema.  Lymphadenopathy:   He has no cervical adenopathy.  Skin: Skin is warm and dry. He is not diaphoretic.  Psychiatric: He has a normal mood and affect. His behavior is normal.         Assessment & Plan:   Wellness Exam: Immunizations  prevnar due, discussed shingrix Colonoscopy   Up to date  Eye exam  Up to date  Hearing loss  none Memory concerns/difficulties    none Independent of ADLs    Fully dependent Stressed the importance of regular exercise EKG Sinus  Rhythm at 66 bpm, RSR, no acute changes, normal EKG, no change compared to prior EKG    Patient received copy of preventative screening tests/immunizations recommended for the next 5-10 years.    Physical exam: Screening blood work reviewed Immunizations  prevnar due, discussed shingrix Colonoscopy   Up to date  Eye exams  Up to date Exercise  Walking regularly Weight  Gained weight when he had to stop walking due to plantar fasciitis Skin no concerns Substance abuse   none   See Problem List for Assessment and Plan of chronic medical problems.   FU in one year

## 2018-04-11 NOTE — Patient Instructions (Addendum)
James [name] , Thank you for taking time to come for your Medicare Wellness Visit. I appreciate your ongoing commitment to your health goals. Please review the following plan we discussed and let me know if I can assist you in the future.   These are the goals we discussed: Goals     Work on weight loss    This is a list of the screening recommended for you and due dates:  Health Maintenance  Topic Date Due  . Pneumonia vaccines (1 of 2 - PCV13) today  . Colon Cancer Screening  08/09/2021  . Tetanus Vaccine  01/30/2024  . Flu Shot  Completed  .  Hepatitis C: One time screening is recommended by Center for Disease Control  (CDC) for  adults born from [birth year] through 1963.   Completed  . HIV Screening  Completed    Call if you stomach symptoms do not resolve completely.   Tests ordered today. Your results will be released to [place] (or called to you) after review, usually within 72hours after test completion. If any changes need to be made, you will be notified at that same time.  All other Health Maintenance issues reviewed.   All recommended immunizations and age-appropriate screenings are up-to-date or discussed.  Prevnar pneumonia immunizations administered today.   Medications reviewed and updated.  Changes include :   None.  Can add pepcid for you stomach   Your prescription(s) have been submitted to your pharmacy. Please take as directed and contact our office if you believe you are having problem(s) with the medication(s).  Please followup in one year    Health Maintenance, Male A healthy lifestyle and preventive care is important for your health and wellness. Ask your health care provider about what schedule of regular examinations is right for you. What should I know about weight and diet? Eat a Healthy Diet  Eat plenty of vegetables, fruits, whole grains, low-fat dairy products, and lean protein.  Do not eat a lot of foods high in solid fats, added sugars, or  salt.  Maintain a Healthy Weight Regular exercise can help you achieve or maintain a healthy weight. You should:  Do at least 150 minutes of exercise each week. The exercise should increase your heart rate and make you sweat (moderate-intensity exercise).  Do strength-training exercises at least twice a week. Watch Your Levels of Cholesterol and Blood Lipids  Have your blood tested for lipids and cholesterol every 5 years starting at 66 years of age. If you are at high risk for heart disease, you should start having your blood tested when you are 66 years old. You may need to have your cholesterol levels checked more often if: ? Your lipid or cholesterol levels are high. ? You are older than 67 years of age. ? You are at high risk for heart disease. What should I know about cancer screening? Many types of cancers can be detected early and may often be prevented. Lung Cancer  You should be screened every year for lung cancer if: ? You are a current smoker who has smoked for at least 30 years. ? You are a former smoker who has quit within the past 15 years.  Talk to your health care provider about your screening options, when you should start screening, and how often you should be screened. Colorectal Cancer  Routine colorectal cancer screening usually begins at 66 years of age and should be repeated every 5-10 years until you are 66 years old.  You may need to be screened more often if early forms of precancerous polyps or small growths are found. Your health care provider may recommend screening at an earlier age if you have risk factors for colon cancer.  Your health care provider may recommend using home test kits to check for hidden blood in the stool.  A small camera at the end of a tube can be used to examine your colon (sigmoidoscopy or colonoscopy). This checks for the earliest forms of colorectal cancer. Prostate and Testicular Cancer  Depending on your age and overall health,  your health care provider may do certain tests to screen for prostate and testicular cancer.  Talk to your health care provider about any symptoms or concerns you have about testicular or prostate cancer. Skin Cancer  Check your skin from head to toe regularly.  Tell your health care provider about any new moles or changes in moles, especially if: ? There is a change in a mole's size, shape, or color. ? You have a mole that is larger than a pencil eraser.  Always use sunscreen. Apply sunscreen liberally and repeat throughout the day.  Protect yourself by wearing long sleeves, pants, a wide-brimmed hat, and sunglasses when outside. What should I know about heart disease, diabetes, and high blood pressure?  If you are 23-84 years of age, have your blood pressure checked every 3-5 years. If you are 88 years of age or older, have your blood pressure checked every year. You should have your blood pressure measured twice-once when you are at a hospital or clinic, and once when you are not at a hospital or clinic. Record the average of the two measurements. To check your blood pressure when you are not at a hospital or clinic, you can use: ? An automated blood pressure machine at a pharmacy. ? A home blood pressure monitor.  Talk to your health care provider about your target blood pressure.  If you are between 19-39 years old, ask your health care provider if you should take aspirin to prevent heart disease.  Have regular diabetes screenings by checking your fasting blood sugar level. ? If you are at a normal weight and have a low risk for diabetes, have this test once every three years after the age of 66. ? If you are overweight and have a high risk for diabetes, consider being tested at a younger age or more often.  A one-time screening for abdominal aortic aneurysm (AAA) by ultrasound is recommended for men aged 77-75 years who are current or former smokers. What should I know about  preventing infection? Hepatitis B If you have a higher risk for hepatitis B, you should be screened for this virus. Talk with your health care provider to find out if you are at risk for hepatitis B infection. Hepatitis C Blood testing is recommended for:  Everyone born from [birth year] through 1965.  Anyone with known risk factors for hepatitis C. Sexually Transmitted Diseases (STDs)  You should be screened each year for STDs including gonorrhea and chlamydia if: ? You are sexually active and are younger than 66 years of age. ? You are older than 66 years of age and your health care provider tells you that you are at risk for this type of infection. ? Your sexual activity has changed since you were last screened and you are at an increased risk for chlamydia or gonorrhea. Ask your health care provider if you are at risk.  Talk with your  health care provider about whether you are at high risk of being infected with HIV. Your health care provider may recommend a prescription medicine to help prevent HIV infection. What else can I do?  Schedule regular health, dental, and eye exams.  Stay current with your vaccines (immunizations).  Do not use any tobacco products, such as cigarettes, chewing tobacco, and e-cigarettes. If you need help quitting, ask your health care provider.  Limit alcohol intake to no more than 2 drinks per day. One drink equals 12 ounces of beer, 5 ounces of wine, or 1 ounces of hard liquor.  Do not use street drugs.  Do not share needles.  Ask your health care provider for help if you need support or information about quitting drugs.  Tell your health care provider if you often feel depressed.  Tell your health care provider if you have ever been abused or do not feel safe at home. This information is not intended to replace advice given to you by your health care provider. Make sure you discuss any questions you have with your health care provider. Document  Released: 09/04/2007 Document Revised: 11/05/2015 Document Reviewed: 12/10/2014 Elsevier Interactive Patient Education  2019 [employer].

## 2018-04-12 ENCOUNTER — Ambulatory Visit (INDEPENDENT_AMBULATORY_CARE_PROVIDER_SITE_OTHER): Payer: Medicare Other | Admitting: Internal Medicine

## 2018-04-12 ENCOUNTER — Encounter: Payer: Self-pay | Admitting: Internal Medicine

## 2018-04-12 VITALS — BP 124/78 | HR 75 | Temp 98.8°F | Resp 16 | Ht 76.0 in | Wt 249.8 lb

## 2018-04-12 DIAGNOSIS — I10 Essential (primary) hypertension: Secondary | ICD-10-CM

## 2018-04-12 DIAGNOSIS — E785 Hyperlipidemia, unspecified: Secondary | ICD-10-CM

## 2018-04-12 DIAGNOSIS — E876 Hypokalemia: Secondary | ICD-10-CM

## 2018-04-12 DIAGNOSIS — R7303 Prediabetes: Secondary | ICD-10-CM | POA: Diagnosis not present

## 2018-04-12 DIAGNOSIS — Z23 Encounter for immunization: Secondary | ICD-10-CM | POA: Diagnosis not present

## 2018-04-12 DIAGNOSIS — K219 Gastro-esophageal reflux disease without esophagitis: Secondary | ICD-10-CM | POA: Diagnosis not present

## 2018-04-12 DIAGNOSIS — Z0000 Encounter for general adult medical examination without abnormal findings: Secondary | ICD-10-CM

## 2018-04-12 MED ORDER — OMEPRAZOLE 40 MG PO CPDR: 40.0000 mg | DELAYED_RELEASE_CAPSULE | Freq: Every day | ORAL | 3 refills | Status: DC

## 2018-04-12 MED ORDER — DOXAZOSIN MESYLATE 8 MG PO TABS: 8.0000 mg | ORAL_TABLET | Freq: Every day | ORAL | 3 refills | Status: DC

## 2018-04-12 MED ORDER — SIMVASTATIN 40 MG PO TABS: 40.0000 mg | ORAL_TABLET | Freq: Every day | ORAL | 3 refills | Status: DC

## 2018-04-12 MED ORDER — ATENOLOL-CHLORTHALIDONE 50-25 MG PO TABS: 1.0000 | ORAL_TABLET | Freq: Every day | ORAL | 3 refills | Status: DC

## 2018-04-12 MED ORDER — POTASSIUM CHLORIDE CRYS ER 20 MEQ PO TBCR: EXTENDED_RELEASE_TABLET | ORAL | 3 refills | Status: DC

## 2018-04-12 NOTE — Assessment & Plan Note (Signed)
Taking potassium cmp

## 2018-04-12 NOTE — Assessment & Plan Note (Signed)
Check lipid panel,cmp ,tsh Continue daily statin Regular exercise and healthy diet encouraged  

## 2018-04-12 NOTE — Assessment & Plan Note (Signed)
Cough and vomit after eating and first thing in the morning likely atypical GERD Continue prilosec daily - take 30 min prior to dinner Start pepcid daily If symptoms do not resolve he will let me know and I will refer to GI

## 2018-04-12 NOTE — Assessment & Plan Note (Signed)
BP well controlled Current regimen effective and well tolerated Continue current medications at current doses cmp  

## 2018-04-12 NOTE — Assessment & Plan Note (Signed)
Check a1c Low sugar / carb diet Stressed regular exercise   

## 2018-05-02 DIAGNOSIS — H2512 Age-related nuclear cataract, left eye: Secondary | ICD-10-CM | POA: Diagnosis not present

## 2018-05-02 DIAGNOSIS — H25812 Combined forms of age-related cataract, left eye: Secondary | ICD-10-CM | POA: Diagnosis not present

## 2018-08-31 DIAGNOSIS — H40013 Open angle with borderline findings, low risk, bilateral: Secondary | ICD-10-CM | POA: Diagnosis not present

## 2018-12-06 DIAGNOSIS — Z8669 Personal history of other diseases of the nervous system and sense organs: Secondary | ICD-10-CM | POA: Diagnosis not present

## 2018-12-06 DIAGNOSIS — H35372 Puckering of macula, left eye: Secondary | ICD-10-CM | POA: Diagnosis not present

## 2018-12-06 DIAGNOSIS — H31009 Unspecified chorioretinal scars, unspecified eye: Secondary | ICD-10-CM | POA: Diagnosis not present

## 2018-12-06 DIAGNOSIS — H43812 Vitreous degeneration, left eye: Secondary | ICD-10-CM | POA: Diagnosis not present

## 2018-12-21 DIAGNOSIS — Z23 Encounter for immunization: Secondary | ICD-10-CM | POA: Diagnosis not present

## 2019-03-26 DIAGNOSIS — H40013 Open angle with borderline findings, low risk, bilateral: Secondary | ICD-10-CM | POA: Diagnosis not present

## 2019-03-26 DIAGNOSIS — H35033 Hypertensive retinopathy, bilateral: Secondary | ICD-10-CM | POA: Diagnosis not present

## 2019-03-26 DIAGNOSIS — H35372 Puckering of macula, left eye: Secondary | ICD-10-CM | POA: Diagnosis not present

## 2019-04-04 ENCOUNTER — Telehealth: Payer: Self-pay

## 2019-04-04 DIAGNOSIS — E785 Hyperlipidemia, unspecified: Secondary | ICD-10-CM

## 2019-04-04 DIAGNOSIS — Z125 Encounter for screening for malignant neoplasm of prostate: Secondary | ICD-10-CM

## 2019-04-04 DIAGNOSIS — R7303 Prediabetes: Secondary | ICD-10-CM

## 2019-04-04 DIAGNOSIS — I10 Essential (primary) hypertension: Secondary | ICD-10-CM

## 2019-04-04 NOTE — Telephone Encounter (Signed)
Copied from [place] [phone]. Topic: General - Inquiry >> Apr 04, 2019 10:35 AM Alease Frame wrote: Reason for CRM: Patient is wanting a call Lovena Le Dr Quay Burow Nurse . Please advise     F/u   Call the patient to inquire a little more information on his message.  Yearly scheduled on 04/17/19  @ 1:30 pm wanted to know about blood work prior to appointment.

## 2019-04-04 NOTE — Telephone Encounter (Signed)
I can except his wife.  If she is in the system she should get approval from her previous PCP.

## 2019-04-04 NOTE — Telephone Encounter (Signed)
Lab appointment made. Pt wants to know if you will except wife as a patient

## 2019-04-04 NOTE — Telephone Encounter (Signed)
Blood work ordered-needs appointment scheduled.

## 2019-04-12 ENCOUNTER — Other Ambulatory Visit (INDEPENDENT_AMBULATORY_CARE_PROVIDER_SITE_OTHER): Payer: Medicare Other

## 2019-04-12 ENCOUNTER — Other Ambulatory Visit: Payer: Self-pay

## 2019-04-12 ENCOUNTER — Other Ambulatory Visit: Payer: Self-pay | Admitting: Internal Medicine

## 2019-04-12 DIAGNOSIS — Z125 Encounter for screening for malignant neoplasm of prostate: Secondary | ICD-10-CM

## 2019-04-12 DIAGNOSIS — R7303 Prediabetes: Secondary | ICD-10-CM

## 2019-04-12 DIAGNOSIS — I10 Essential (primary) hypertension: Secondary | ICD-10-CM

## 2019-04-12 DIAGNOSIS — E785 Hyperlipidemia, unspecified: Secondary | ICD-10-CM | POA: Diagnosis not present

## 2019-04-12 LAB — CBC WITH DIFFERENTIAL/PLATELET
Basophils Absolute: 0 10*3/uL (ref 0.0–0.1)
Basophils Relative: 0.5 % (ref 0.0–3.0)
Eosinophils Absolute: 0.4 10*3/uL (ref 0.0–0.7)
Eosinophils Relative: 7.5 % — ABNORMAL HIGH (ref 0.0–5.0)
HCT: 43.6 % (ref 39.0–52.0)
Hemoglobin: 14.8 g/dL (ref 13.0–17.0)
Lymphocytes Relative: 23.9 % (ref 12.0–46.0)
Lymphs Abs: 1.4 10*3/uL (ref 0.7–4.0)
MCHC: 34 g/dL (ref 30.0–36.0)
MCV: 89.3 fl (ref 78.0–100.0)
Monocytes Absolute: 0.4 10*3/uL (ref 0.1–1.0)
Monocytes Relative: 7.4 % (ref 3.0–12.0)
Neutro Abs: 3.6 10*3/uL (ref 1.4–7.7)
Neutrophils Relative %: 60.7 % (ref 43.0–77.0)
Platelets: 214 10*3/uL (ref 150.0–400.0)
RBC: 4.89 Mil/uL (ref 4.22–5.81)
RDW: 13.9 % (ref 11.5–15.5)
WBC: 5.9 10*3/uL (ref 4.0–10.5)

## 2019-04-12 LAB — COMPREHENSIVE METABOLIC PANEL
ALT: 20 U/L (ref 0–53)
AST: 21 U/L (ref 0–37)
Albumin: 4.2 g/dL (ref 3.5–5.2)
Alkaline Phosphatase: 63 U/L (ref 39–117)
BUN: 16 mg/dL (ref 6–23)
CO2: 32 mEq/L (ref 19–32)
Calcium: 9 mg/dL (ref 8.4–10.5)
Chloride: 99 mEq/L (ref 96–112)
Creatinine, Ser: 1.14 mg/dL (ref 0.40–1.50)
GFR: 64.22 mL/min (ref >60.00)
Glucose, Bld: 119 mg/dL — ABNORMAL HIGH (ref 70–99)
Potassium: 3.2 mEq/L — ABNORMAL LOW (ref 3.5–5.1)
Sodium: 138 mEq/L (ref 135–145)
Total Bilirubin: 0.7 mg/dL (ref 0.2–1.2)
Total Protein: 6.5 g/dL (ref 6.0–8.3)

## 2019-04-12 LAB — TSH: TSH: 1.9 u[IU]/mL (ref 0.35–4.50)

## 2019-04-12 LAB — LIPID PANEL
Cholesterol: 173 mg/dL (ref 0–200)
HDL: 55.1 mg/dL (ref >39.00)
LDL Cholesterol: 96 mg/dL (ref 0–99)
NonHDL: 117.66
Total CHOL/HDL Ratio: 3
Triglycerides: 109 mg/dL (ref 0.0–149.0)
VLDL: 21.8 mg/dL (ref 0.0–40.0)

## 2019-04-12 LAB — PSA, MEDICARE: PSA: 1.7 ng/ml (ref 0.10–4.00)

## 2019-04-12 LAB — HEMOGLOBIN A1C: Hgb A1c MFr Bld: 6 % (ref 4.6–6.5)

## 2019-04-16 NOTE — Patient Instructions (Addendum)
  [name] , Thank you for taking time to come for your Medicare Wellness Visit. I appreciate your ongoing commitment to your health goals. Please review the following plan we discussed and let me know if I can assist you in the future.   These are the goals we discussed: Goals   Work on weight loss     This is a list of the screening recommended for you and due dates:  Health Maintenance  Topic Date Due  . Colon Cancer Screening  08/09/2021  . Tetanus Vaccine  01/30/2024  . Flu Shot  Completed  .  Hepatitis C: One time screening is recommended by Center for Disease Control  (CDC) for  adults born from [birth year] through 1963.   Completed  . Pneumonia vaccines  Completed     Blood work was reviewed.     Medications reviewed and updated.  Changes include :   none  Your prescription(s) have been submitted to your pharmacy. Please take as directed and contact our office if you believe you are having problem(s) with the medication(s).     Please followup in 1 year

## 2019-04-16 NOTE — Progress Notes (Signed)
Subjective:    Patient ID: James Meeker., male    DOB: 11-14-1950, 69 y.o.   MRN: [id number]  HPI He is here for a subsequent medicare wellness exam and follow up of his chronic medical problems, including hypertension, GERD, hyperlipidemia, prediabetes  I have personally reviewed and have noted 1.         The patient's medical and social history 2.         Their use of alcohol, tobacco or illicit drugs 3.         Their current medications and supplements 4.         The patient's functional ability including ADL's, fall risks, home safety risk and hearing or visual impairment. 5.         Diet and physical activities 6.         Evidence for depression or mood disorders 7.         Care team reviewed  -  Eye - Dr Zadie Rhine and Dr Herbert Deaner.  No other specialists    Are there smokers in your home (other than you)? No  Risk Factors Exercise:   Walking regularly 2 miles a day Dietary issues discussed: well balanced, drinking more alcohol,  Trying to keep sweets to a minimum  Vitamin and supplement use:  none  Opiod use:   no   Cardiac risk factors: advanced age, hypertension, hyperlipidemia, and obesity  Depression Screen  Have you felt down, depressed or hopeless? No  Have you felt little interest or pleasure in doing things?  No  Activities of Daily Living In your present state of health, do you have any difficulty performing the following activities?:  Driving? No Managing money?  No Feeding yourself? No Getting from bed to chair? No Climbing a flight of stairs? No Preparing food and eating?: No Bathing or showering? No Getting dressed: No Getting to/using the toilet? No Moving around from place to place: No In the past year have you fallen or had a near fall?: No   Are you sexually active?  yes  Do you have more than one partner?  No   Hearing Difficulties: No Do you often ask people to speak up or repeat themselves? No Do you experience ringing or noises in your  ears? No Do you have difficulty understanding soft or whispered voices? No Vision:              Any change in vision:  no             Up to date with eye exam:   yes   Memory:  Do you feel that you have a problem with memory? No  Do you often misplace items? No  Do you feel safe at home?  Yes  Cognitive Testing  Alert, Orientated? Yes  Normal Appearance? Yes  Recall of three objects?  Yes - plane, clock, eraser, 3/3  Can perform simple calculations? Yes  Displays appropriate judgment? Yes  Can read the correct time from a watch face? Yes   Advanced Directives have been discussed with the patient? Yes - in place   Medications and allergies reviewed with patient and updated if appropriate.  Patient Active Problem List   Diagnosis Date Noted  . Dupuytren's contracture of left hand 01/27/2017  . Prediabetes 01/27/2017  . Hypokalemia 01/27/2016  . Lumbar spondylosis 12/20/2014  . Hyperlipidemia 04/07/2007  . Essential hypertension 04/07/2007  . GERD 04/07/2007    No current outpatient medications on  file prior to visit.   No current facility-administered medications on file prior to visit.    Past Medical History:  Diagnosis Date  . Angina   . Anxiety    especially revolving around surgery & medical care   . GERD (gastroesophageal reflux disease)   . Heart murmur   . Hemorrhoids   . History of stress test 2013   told it was normal  . Hyperlipidemia   . Hypertension   . Neuromuscular disorder ([place])    spinal stenosis   . Shortness of breath     Past Surgical History:  Procedure Laterality Date  . APPENDECTOMY    . CERVICAL DISC SURGERY    . COLONOSCOPY    . EYE SURGERY  09&12/2009   x3- (total of 7 eye surgery)   . LUMBAR LAMINECTOMY  1990's   x2  . TESTICLE TORSION REDUCTION    . UPPER GASTROINTESTINAL ENDOSCOPY      Social History   Socioeconomic History  . Marital status: Married    Spouse name: Not on file  . Number of children: Not on file  .  Years of education: Not on file  . Highest education level: Not on file  Occupational History  . Not on file  Tobacco Use  . Smoking status: Never Smoker  . Smokeless tobacco: Never Used  Substance and Sexual Activity  . Alcohol use: Yes    Comment: 1 bottle wine a week, on weekends   . Drug use: Yes    Types: Marijuana    Comment: recreationally- early August- 2016  . Sexual activity: Yes  Other Topics Concern  . Not on file  Social History Narrative   [employer] driver    No tobacco   No ets    Married    Wife works Scientist, research (life sciences)   Social Determinants of Radio broadcast assistant Strain:   . Difficulty of Paying Living Expenses: Not on file  Food Insecurity:   . Worried About Charity fundraiser in the Last Year: Not on file  . Ran Out of Food in the Last Year: Not on file  Transportation Needs:   . Lack of Transportation (Medical): Not on file  . Lack of Transportation (Non-Medical): Not on file  Physical Activity:   . Days of Exercise per Week: Not on file  . Minutes of Exercise per Session: Not on file  Stress:   . Feeling of Stress : Not on file  Social Connections:   . Frequency of Communication with Friends and Family: Not on file  . Frequency of Social Gatherings with Friends and Family: Not on file  . Attends Religious Services: Not on file  . Active Member of Clubs or Organizations: Not on file  . Attends Archivist Meetings: Not on file  . Marital Status: Not on file    Family History  Problem Relation Age of Onset  . Cancer Mother        skin cancer/melanoma  . Hypertension Father   . Heart disease Father   . Colon cancer Neg Hx     Review of Systems  Constitutional: Negative for chills, fatigue and fever.  HENT: Positive for postnasal drip.   Eyes: Negative for visual disturbance.  Respiratory: Positive for cough (from PND). Negative for shortness of breath and wheezing.   Cardiovascular: Negative for chest pain, palpitations  and leg swelling.  Gastrointestinal: Negative for abdominal pain, blood in stool, constipation, diarrhea and nausea.  Occ GERD  Genitourinary: Negative for dysuria and hematuria.  Musculoskeletal: Positive for arthralgias (mild OA). Negative for back pain.  Skin: Negative for color change and rash.  Neurological: Negative for dizziness, light-headedness and headaches.  Psychiatric/Behavioral: Negative for dysphoric mood and sleep disturbance. The patient is not nervous/anxious.        Objective:   Vitals:   04/17/19 1317  BP: 138/82  Pulse: 81  Resp: 16  Temp: 98.1 F (36.7 C)  SpO2: 94%   BP Readings from Last 3 Encounters:  04/17/19 138/82  04/12/18 124/78  01/27/17 132/88   Wt Readings from Last 3 Encounters:  04/17/19 251 lb (113.9 kg)  04/12/18 249 lb 12.8 oz (113.3 kg)  01/27/17 238 lb (108 kg)   Body mass index is 30.55 kg/m.   Depression screen [facility] 2/9 04/17/2019 01/27/2017  Decreased Interest 0 0  Down, Depressed, Hopeless 0 0  PHQ - 2 Score 0 0     Physical Exam    Constitutional: Appears well-developed and well-nourished. No distress.  HENT:  Head: Normocephalic and atraumatic. Ears: Normal tympanic membranes and ear canals bilaterally Neck: Neck supple. No tracheal deviation present. No thyromegaly present.  No cervical lymphadenopathy Oropharynx: Moist, no erythema Cardiovascular: Normal rate, regular rhythm and normal heart sounds.  No murmur heard. No carotid bruit .  No edema Pulmonary/Chest: Effort normal and breath sounds normal. No respiratory distress. No has no wheezes. No rales. Abdomen: Soft, obese, nontender Skin: Skin is warm and dry. Not diaphoretic.  Psychiatric: Normal mood and affect. Behavior is normal.      Assessment & Plan:   Health Maintenance  Topic Date Due  . COLONOSCOPY  08/09/2021  . TETANUS/TDAP  01/30/2024  . INFLUENZA VACCINE  Completed  . Hepatitis C Screening  Completed  . PNA vac Low Risk Adult   Completed    Wellness Exam: Immunizations  PPSV23 today, discussed shingrix, covid Colonoscopy  Up to date  Eye exam   Up to date  Hearing loss   none Memory concerns/difficulties   none Independent of ADLs  Fully independent Stressed the importance of regular exercise Encouraged weight loss   Patient received copy of preventative screening tests/immunizations recommended for the next 5-10 years.   See Problem List for Assessment and Plan of chronic medical problems.      This visit occurred during the SARS-CoV-2 public health emergency.  Safety protocols were in place, including screening questions prior to the visit, additional usage of staff PPE, and extensive cleaning of exam room while observing appropriate contact time as indicated for disinfecting solutions.

## 2019-04-17 ENCOUNTER — Ambulatory Visit (INDEPENDENT_AMBULATORY_CARE_PROVIDER_SITE_OTHER): Payer: Medicare Other | Admitting: Internal Medicine

## 2019-04-17 ENCOUNTER — Encounter: Payer: Self-pay | Admitting: Internal Medicine

## 2019-04-17 ENCOUNTER — Other Ambulatory Visit: Payer: Self-pay

## 2019-04-17 VITALS — BP 138/82 | HR 81 | Temp 98.1°F | Resp 16 | Ht 76.0 in | Wt 251.0 lb

## 2019-04-17 DIAGNOSIS — I10 Essential (primary) hypertension: Secondary | ICD-10-CM | POA: Diagnosis not present

## 2019-04-17 DIAGNOSIS — E785 Hyperlipidemia, unspecified: Secondary | ICD-10-CM | POA: Diagnosis not present

## 2019-04-17 DIAGNOSIS — R7303 Prediabetes: Secondary | ICD-10-CM

## 2019-04-17 DIAGNOSIS — Z23 Encounter for immunization: Secondary | ICD-10-CM | POA: Diagnosis not present

## 2019-04-17 DIAGNOSIS — Z0000 Encounter for general adult medical examination without abnormal findings: Secondary | ICD-10-CM | POA: Diagnosis not present

## 2019-04-17 DIAGNOSIS — K219 Gastro-esophageal reflux disease without esophagitis: Secondary | ICD-10-CM

## 2019-04-17 DIAGNOSIS — E782 Mixed hyperlipidemia: Secondary | ICD-10-CM | POA: Diagnosis not present

## 2019-04-17 DIAGNOSIS — E876 Hypokalemia: Secondary | ICD-10-CM | POA: Diagnosis not present

## 2019-04-17 MED ORDER — DOXAZOSIN MESYLATE 8 MG PO TABS: 8.0000 mg | ORAL_TABLET | Freq: Every day | ORAL | 3 refills | Status: DC

## 2019-04-17 MED ORDER — ATENOLOL-CHLORTHALIDONE 50-25 MG PO TABS: 1.0000 | ORAL_TABLET | Freq: Every day | ORAL | 3 refills | Status: DC

## 2019-04-17 MED ORDER — POTASSIUM CHLORIDE CRYS ER 20 MEQ PO TBCR: EXTENDED_RELEASE_TABLET | ORAL | 3 refills | Status: DC

## 2019-04-17 MED ORDER — SIMVASTATIN 40 MG PO TABS: 40.0000 mg | ORAL_TABLET | Freq: Every day | ORAL | 3 refills | Status: DC

## 2019-04-17 NOTE — Assessment & Plan Note (Addendum)
cmp reviewed, potassium of 3.2 - low continue daily potassium supp-potassium level typically within normal limits we will continue his current dose

## 2019-04-17 NOTE — Assessment & Plan Note (Signed)
Chronic Taking omeprazole as needed Controlled continue

## 2019-04-17 NOTE — Assessment & Plan Note (Addendum)
Lab Results  Component Value Date   HGBA1C 6.0 04/12/2019   Stable in prediabetic range Chronic Low sugar / carb diet Stressed regular exercise Encouraged weight loss

## 2019-04-17 NOTE — Assessment & Plan Note (Signed)
Chronic Lipids well controlled Continue daily statin Regular exercise and healthy diet encouraged  

## 2019-04-17 NOTE — Assessment & Plan Note (Signed)
Chronic BP well controlled Current regimen effective and well tolerated Continue current medications at current doses cmp reviewed  

## 2019-05-24 ENCOUNTER — Ambulatory Visit: Payer: Medicare Other | Attending: Internal Medicine

## 2019-05-24 DIAGNOSIS — Z23 Encounter for immunization: Secondary | ICD-10-CM

## 2019-05-24 NOTE — Progress Notes (Signed)
   [id number]  Name:  James [name]    MRN: [id number] DOB: [birth year]  05/24/2019  James [name] was observed post Covid-19 immunization for 15 minutes without incident. He was provided with Vaccine Information Sheet and instruction to access the V-Safe system.   James [name] was instructed to call 911 with any severe reactions post vaccine: Marland Kitchen Difficulty breathing  . Swelling of face and throat  . A fast heartbeat  . A bad rash all over body  . Dizziness and weakness   Immunizations Administered    Name Date Dose VIS Date Route   Pfizer COVID-19 Vaccine 05/24/2019 10:14 AM 0.3 mL 03/02/2019 Intramuscular   Manufacturer: [place]   Lot: [id number]   [place]: [id number]

## 2019-06-19 ENCOUNTER — Ambulatory Visit: Payer: Medicare Other | Attending: Internal Medicine

## 2019-06-19 DIAGNOSIS — Z23 Encounter for immunization: Secondary | ICD-10-CM

## 2019-06-19 NOTE — Progress Notes (Signed)
   [id number]  Name:  James [name]    MRN: [id number] DOB: 01-07-1951  06/19/2019  [name] was observed post Covid-19 immunization for 15 minutes without incident. He was provided with Vaccine Information Sheet and instruction to access the V-Safe system.   [name] was instructed to call 911 with any severe reactions post vaccine: Marland Kitchen Difficulty breathing  . Swelling of face and throat  . A fast heartbeat  . A bad rash all over body  . Dizziness and weakness   Immunizations Administered    Name Date Dose VIS Date Route   Pfizer COVID-19 Vaccine 06/19/2019  1:26 PM 0.3 mL 03/02/2019 Intramuscular   Manufacturer: [place]   Lot: [id number]   [place]: [id number]

## 2019-09-05 DIAGNOSIS — D485 Neoplasm of uncertain behavior of skin: Secondary | ICD-10-CM | POA: Diagnosis not present

## 2019-11-02 ENCOUNTER — Telehealth: Payer: Self-pay | Admitting: Internal Medicine

## 2019-11-02 NOTE — Progress Notes (Signed)
°  Chronic Care Management   Outreach Note  11/02/2019 Name: Dwan Hemmelgarn. MRN: [id number] DOB: 26-Oct-1950  Referred by: Binnie Rail, MD Reason for referral : No chief complaint on file.   An unsuccessful telephone outreach was attempted today. The patient was referred to the pharmacist for assistance with care management and care coordination.   Follow Up Plan:   Earney Hamburg Upstream Scheduler

## 2019-11-21 ENCOUNTER — Telehealth: Payer: Self-pay | Admitting: Internal Medicine

## 2019-11-21 NOTE — Progress Notes (Signed)
°  Chronic Care Management   Outreach Note  11/21/2019 Name: James [name] MRN: [id number] DOB: February 16, 1951  Referred by: Binnie Rail, MD Reason for referral : No chief complaint on file.   An unsuccessful telephone outreach was attempted today. The patient was referred to the pharmacist for assistance with care management and care coordination.   Follow Up Plan:   Earney Hamburg Upstream Scheduler

## 2019-11-29 ENCOUNTER — Telehealth: Payer: Self-pay | Admitting: Internal Medicine

## 2019-11-29 NOTE — Progress Notes (Signed)
  Chronic Care Management   Note  11/29/2019 Name: Cary Lothrop. MRN: [id number] DOB: [birth year]  Endi Lagman. is a 69 y.o. year old male who is a primary care patient of Burns, Claudina Lick, MD. I reached out to [name]. by phone today in response to a referral sent by [name]'s PCP, Quay Burow, Claudina Lick, MD.   Mr. Willcutt was given information about Chronic Care Management services today including:  1. CCM service includes personalized support from designated clinical staff supervised by his physician, including individualized plan of care and coordination with other care providers 2. 24/7 contact phone numbers for assistance for urgent and routine care needs. 3. Service will only be billed when office clinical staff spend 20 minutes or more in a month to coordinate care. 4. Only one practitioner may furnish and bill the service in a calendar month. 5. The patient may stop CCM services at any time (effective at the end of the month) by phone call to the office staff.   Patient wishes to consider information provided and/or speak with a member of the care team before deciding about enrollment in care management services.   Follow up plan:   Carley Perdue UpStream Scheduler

## 2019-12-06 ENCOUNTER — Other Ambulatory Visit: Payer: Self-pay

## 2019-12-06 ENCOUNTER — Ambulatory Visit (INDEPENDENT_AMBULATORY_CARE_PROVIDER_SITE_OTHER): Payer: Medicare Other | Admitting: Ophthalmology

## 2019-12-06 ENCOUNTER — Encounter (INDEPENDENT_AMBULATORY_CARE_PROVIDER_SITE_OTHER): Payer: Self-pay | Admitting: Ophthalmology

## 2019-12-06 DIAGNOSIS — H31009 Unspecified chorioretinal scars, unspecified eye: Secondary | ICD-10-CM | POA: Insufficient documentation

## 2019-12-06 DIAGNOSIS — H31001 Unspecified chorioretinal scars, right eye: Secondary | ICD-10-CM | POA: Diagnosis not present

## 2019-12-06 DIAGNOSIS — H43812 Vitreous degeneration, left eye: Secondary | ICD-10-CM | POA: Diagnosis not present

## 2019-12-06 DIAGNOSIS — H35361 Drusen (degenerative) of macula, right eye: Secondary | ICD-10-CM | POA: Diagnosis not present

## 2019-12-06 DIAGNOSIS — H35372 Puckering of macula, left eye: Secondary | ICD-10-CM

## 2019-12-06 DIAGNOSIS — Z8669 Personal history of other diseases of the nervous system and sense organs: Secondary | ICD-10-CM

## 2019-12-06 DIAGNOSIS — H4311 Vitreous hemorrhage, right eye: Secondary | ICD-10-CM | POA: Insufficient documentation

## 2019-12-06 NOTE — Assessment & Plan Note (Signed)
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

## 2019-12-06 NOTE — Patient Instructions (Signed)
Patient to notify the office promptly if new onset visual acuity decline, distortion or symptoms of retinal detachment Vitreous Detachment  Vitreous detachment is part of the normal aging process in the eyes. Vitreous is the jelly-like substance that makes up most of the inside of the eyeballs. It helps the eyeballs keep a round shape. The vitreous is attached to the retina of the eye with a series of fibers. As you age, the vitreous gradually shrinks. Tension increases between the fibers and the retina. Eventually, the fibers can break free from the retina, causing vitreous detachment. In most cases, this does not cause problems and does not require treatment. However, it can sometimes cause the retina to separate from the eyeball (retinal detachment), which requires treatment to prevent vision loss. What are the causes? Aging is the main cause of vitreous detachment. Everyone's vitreous naturally shrinks with age. What increases the risk? You are more likely to have vitreous detachment if you:  Are at least 67 years old.  Have inflammation of the eye.  Have an eye injury.  Have had eye surgery.  Have a hemorrhage in your eye.  Are very nearsighted (myopia).  Have diabetes. What are the signs or symptoms? Most people with this condition will not notice any symptoms. If symptoms do occur, the most common are floaters. Floaters occur as the vitreous begins to shrink. They may:  Appear as tiny dots or webs in your vision.  Seem to disappear when you look at them directly.  Appear more often as your condition gets worse. Other symptoms include:  Flashes of light (photopsia) in your peripheral vision that may look like lightning streaks.  Decreased vision or a dark curtain or shadow moving across your field of vision. This is rare. How is this diagnosed? This condition may be diagnosed based on:  Your signs and symptoms.  An exam by a health care provider who specializes in  conditions and diseases of the eye (ophthalmologist). The exam may include: ? Putting eye drops in your eye to make the pupil wider (dilated). The pupil is the opening in the center of the eye. ? Checking the pupils with a magnifying glass. This exam is the best way to determine the type and extent of damage to your eye. How is this treated? For most people, a vitreous detachment is harmless, causing no symptoms or vision loss, and does not require treatment. Floaters usually become less noticeable over time. If the condition causes retinal detachment, you may need eye surgery to reattach your retina (reattachment surgery) in order to prevent vision loss or restore your vision. Follow these instructions at home:  Keep all follow-up visits as told by your health care provider. This is important. Get help right away if:  You develop signs of retinal detachment. These include: ? A sudden increase in the number of floaters you see. ? An increase in the number of flashes of light you see in your peripheral vision. ? Decreased vision. Summary  Vitreous detachment is part of the normal aging process in the eyes.  Vitreous is the jelly-like substance inside the eyeballs. As you age, the vitreous shrinks, and the fibers that attach the vitreous to the retina can break free, causing vitreous detachment.  In most cases, vitreous detachment does not cause symptoms and does not require treatment. The most common symptom that can occur is seeing floaters that appear as tiny dots or webs in your vision.  Vitreous detachment can sometimes cause the retina to  separate from the eyeball (retinal detachment). This must be treated to prevent vision loss. This information is not intended to replace advice given to you by your health care provider. Make sure you discuss any questions you have with your health care provider. Document Revised: 02/18/2017 Document Reviewed: 01/27/2017 Elsevier Patient Education   2020 [employer].

## 2019-12-06 NOTE — Progress Notes (Signed)
12/06/2019     CHIEF COMPLAINT Patient presents for Retina Follow Up   HISTORY OF PRESENT ILLNESS: James [name] is a 69 y.o. male who presents to the clinic today for:   HPI    Retina Follow Up    Patient presents with  Other (ERM).  In both eyes.  Severity is moderate.  Duration of 1 year.  Since onset it is stable.  I, the attending physician,  performed the HPI with the patient and updated documentation appropriately.          Comments    1 Year f\u OU. OCT  Pt states vision has been stable. Denies FOL and floaters.       Last edited by Tilda Franco on 12/06/2019 12:59 PM. (History)      Referring physician: Binnie Rail, MD [place],  [place] [id number]  HISTORICAL INFORMATION:   Selected notes from the MEDICAL RECORD NUMBER    Lab Results  Component Value Date   HGBA1C 6.0 04/12/2019     CURRENT MEDICATIONS: No current outpatient medications on file. (Ophthalmic Drugs)   No current facility-administered medications for this visit. (Ophthalmic Drugs)   Current Outpatient Medications (Other)  Medication Sig  . atenolol-chlorthalidone (TENORETIC) 50-25 MG tablet Take 1 tablet by mouth daily.  Marland Kitchen doxazosin (CARDURA) 8 MG tablet Take 1 tablet (8 mg total) by mouth at bedtime.  . potassium chloride SA (KLOR-CON M20) 20 MEQ tablet TAKE TWO TABLETS BY MOUTH TWO TIMES A DAY  . simvastatin (ZOCOR) 40 MG tablet Take 1 tablet (40 mg total) by mouth at bedtime.   No current facility-administered medications for this visit. (Other)      REVIEW OF SYSTEMS:    ALLERGIES Allergies  Allergen Reactions  . Hydrocodone Itching    PAST MEDICAL HISTORY Past Medical History:  Diagnosis Date  . Angina   . Anxiety    especially revolving around surgery & medical care   . GERD (gastroesophageal reflux disease)   . Heart murmur   . Hemorrhoids   . History of stress test 2013   told it was normal  . Hyperlipidemia   . Hypertension   .  Neuromuscular disorder ([place])    spinal stenosis   . Shortness of breath    Past Surgical History:  Procedure Laterality Date  . APPENDECTOMY    . CERVICAL DISC SURGERY    . COLONOSCOPY    . EYE SURGERY  09&12/2009   x3- (total of 7 eye surgery)   . LUMBAR LAMINECTOMY  1990's   x2  . TESTICLE TORSION REDUCTION    . UPPER GASTROINTESTINAL ENDOSCOPY      FAMILY HISTORY Family History  Problem Relation Age of Onset  . Cancer Mother        skin cancer/melanoma  . Hypertension Father   . Heart disease Father   . Colon cancer Neg Hx     SOCIAL HISTORY Social History   Tobacco Use  . Smoking status: Never Smoker  . Smokeless tobacco: Never Used  Substance Use Topics  . Alcohol use: Yes    Comment: 1 bottle wine a week, on weekends   . Drug use: Yes    Types: Marijuana    Comment: recreationally- early August- 2016         OPHTHALMIC EXAM:  Base Eye Exam    Visual Acuity (Snellen - Linear)      Right Left   Dist [place] 20/100 20/20  Dist ph [place] 20/40 -1        Tonometry (Tonopen, 1:03 PM)      Right Left   Pressure 17 17       Pupils      Pupils Dark Light Shape React APD   Right PERRL 4 3 Round Brisk None   Left PERRL 4 3 Round Brisk None       Visual Fields (Counting fingers)      Left Right    Full Full       Neuro/Psych    Oriented x3: Yes   Mood/Affect: Normal       Dilation    Both eyes: 1.0% Mydriacyl, 2.5% Phenylephrine @ 1:03 PM        Slit Lamp and Fundus Exam    External Exam      Right Left   External Normal Normal       Slit Lamp Exam      Right Left   Lids/Lashes Normal Normal   Conjunctiva/Sclera White and quiet White and quiet   Cornea Clear Clear   Anterior Chamber Deep and quiet Deep and quiet   Iris Round and reactive Round and reactive   Lens Centered posterior chamber intraocular lens Centered posterior chamber intraocular lens   Anterior Vitreous Normal Normal       Fundus Exam      Right Left   Posterior  Vitreous Normal Normal   Disc Normal Normal   C/D Ratio 0.4 0.4   Macula Normal Normal   Vessels Normal Normal   Periphery Good buckle, cryopexy attached Good buckle, cryopexy attached          IMAGING AND PROCEDURES  Imaging and Procedures for 12/06/19  OCT, Retina - OU - Both Eyes       Right Eye Quality was good. Scan locations included subfoveal. Central Foveal Thickness: 290. Findings include normal foveal contour.   Left Eye Quality was good. Central Foveal Thickness: 305. Findings include epiretinal membrane, normal foveal contour.   Notes Minor epiretinal membrane nasal and somewhat superior to the fovea left eye with no distortion of the foveal elements, observe                ASSESSMENT/PLAN:  Left epiretinal membrane Minor epiretinal membrane, nasal to the fovea, stable over time, observe      ICD-10-CM   1. 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 Posterior vitreous detachment of left eye  H43.812   5. History of retinal detachment  Z86.69     1.  No new findings, patient's visual acuity has been stable now for the better part of 10 years.  2.  3.  Ophthalmic Meds Ordered this visit:  No orders of the defined types were placed in this encounter.      Return in about 2 years (around 12/05/2021) for COLOR FP, DILATE OU, OCT.  Patient Instructions  Patient to notify the office promptly if new onset visual acuity decline, distortion or symptoms of retinal detachment Vitreous Detachment  Vitreous detachment is part of the normal aging process in the eyes. Vitreous is the jelly-like substance that makes up most of the inside of the eyeballs. It helps the eyeballs keep a round shape. The vitreous is attached to the retina of the eye with a series of fibers. As  you age, the vitreous gradually shrinks. Tension increases  between the fibers and the retina. Eventually, the fibers can break free from the retina, causing vitreous detachment. In most cases, this does not cause problems and does not require treatment. However, it can sometimes cause the retina to separate from the eyeball (retinal detachment), which requires treatment to prevent vision loss. What are the causes? Aging is the main cause of vitreous detachment. Everyone's vitreous naturally shrinks with age. What increases the risk? You are more likely to have vitreous detachment if you:  Are at least 67 years old.  Have inflammation of the eye.  Have an eye injury.  Have had eye surgery.  Have a hemorrhage in your eye.  Are very nearsighted (myopia).  Have diabetes. What are the signs or symptoms? Most people with this condition will not notice any symptoms. If symptoms do occur, the most common are floaters. Floaters occur as the vitreous begins to shrink. They may:  Appear as tiny dots or webs in your vision.  Seem to disappear when you look at them directly.  Appear more often as your condition gets worse. Other symptoms include:  Flashes of light (photopsia) in your peripheral vision that may look like lightning streaks.  Decreased vision or a dark curtain or shadow moving across your field of vision. This is rare. How is this diagnosed? This condition may be diagnosed based on:  Your signs and symptoms.  An exam by a health care provider who specializes in conditions and diseases of the eye (ophthalmologist). The exam may include: ? Putting eye drops in your eye to make the pupil wider (dilated). The pupil is the opening in the center of the eye. ? Checking the pupils with a magnifying glass. This exam is the best way to determine the type and extent of damage to your eye. How is this treated? For most people, a vitreous detachment is harmless, causing no symptoms or vision loss, and does not require treatment. Floaters usually  become less noticeable over time. If the condition causes retinal detachment, you may need eye surgery to reattach your retina (reattachment surgery) in order to prevent vision loss or restore your vision. Follow these instructions at home:  Keep all follow-up visits as told by your health care provider. This is important. Get help right away if:  You develop signs of retinal detachment. These include: ? A sudden increase in the number of floaters you see. ? An increase in the number of flashes of light you see in your peripheral vision. ? Decreased vision. Summary  Vitreous detachment is part of the normal aging process in the eyes.  Vitreous is the jelly-like substance inside the eyeballs. As you age, the vitreous shrinks, and the fibers that attach the vitreous to the retina can break free, causing vitreous detachment.  In most cases, vitreous detachment does not cause symptoms and does not require treatment. The most common symptom that can occur is seeing floaters that appear as tiny dots or webs in your vision.  Vitreous detachment can sometimes cause the retina to separate from the eyeball (retinal detachment). This must be treated to prevent vision loss. This information is not intended to replace advice given to you by your health care provider. Make sure you discuss any questions you have with your health care provider. Document Revised: 02/18/2017 Document Reviewed: 01/27/2017 Elsevier Patient Education  [place] the diagnoses, plan, and follow up with the patient and  they expressed understanding.  Patient expressed understanding of the importance of proper follow up care.   Clent Demark Jamesia Linnen M.D. Diseases & Surgery of the Retina and Vitreous Retina & Diabetic [place] 12/06/19     Abbreviations: M myopia (nearsighted); A astigmatism; H hyperopia (farsighted); P presbyopia; Mrx spectacle prescription;  CTL contact lenses; OD right eye; OS left  eye; OU both eyes  XT exotropia; ET esotropia; PEK punctate epithelial keratitis; PEE punctate epithelial erosions; DES dry eye syndrome; MGD meibomian gland dysfunction; ATs artificial tears; PFAT's preservative free artificial tears; [place] nuclear sclerotic cataract; PSC posterior subcapsular cataract; ERM epi-retinal membrane; PVD posterior vitreous detachment; RD retinal detachment; DM diabetes mellitus; DR diabetic retinopathy; NPDR non-proliferative diabetic retinopathy; PDR proliferative diabetic retinopathy; CSME clinically significant macular edema; DME diabetic macular edema; dbh dot blot hemorrhages; CWS cotton wool spot; POAG primary open angle glaucoma; C/D cup-to-disc ratio; HVF humphrey visual field; GVF goldmann visual field; OCT optical coherence tomography; IOP intraocular pressure; BRVO Branch retinal vein occlusion; CRVO central retinal vein occlusion; CRAO central retinal artery occlusion; BRAO branch retinal artery occlusion; RT retinal tear; SB scleral buckle; PPV pars plana vitrectomy; VH Vitreous hemorrhage; PRP panretinal laser photocoagulation; IVK intravitreal kenalog; VMT vitreomacular traction; MH Macular hole;  NVD neovascularization of the disc; NVE neovascularization elsewhere; AREDS age related eye disease study; ARMD age related macular degeneration; POAG primary open angle glaucoma; EBMD epithelial/anterior basement membrane dystrophy; ACIOL anterior chamber intraocular lens; IOL intraocular lens; PCIOL posterior chamber intraocular lens; Phaco/IOL phacoemulsification with intraocular lens placement; [place] photorefractive keratectomy; LASIK laser assisted in situ keratomileusis; HTN hypertension; DM diabetes mellitus; COPD chronic obstructive pulmonary disease

## 2019-12-24 DIAGNOSIS — Z23 Encounter for immunization: Secondary | ICD-10-CM | POA: Diagnosis not present

## 2020-01-24 DIAGNOSIS — Z23 Encounter for immunization: Secondary | ICD-10-CM | POA: Diagnosis not present

## 2020-04-02 ENCOUNTER — Telehealth: Payer: Self-pay | Admitting: Internal Medicine

## 2020-04-02 DIAGNOSIS — Z125 Encounter for screening for malignant neoplasm of prostate: Secondary | ICD-10-CM

## 2020-04-02 DIAGNOSIS — R7303 Prediabetes: Secondary | ICD-10-CM

## 2020-04-02 DIAGNOSIS — I10 Essential (primary) hypertension: Secondary | ICD-10-CM

## 2020-04-02 DIAGNOSIS — E782 Mixed hyperlipidemia: Secondary | ICD-10-CM

## 2020-04-02 NOTE — Telephone Encounter (Signed)
Patient is requesting lab work before his 04/21/20 appointment. Please call him if the labs are put in [phone].

## 2020-04-03 NOTE — Telephone Encounter (Signed)
Patient scheduled.

## 2020-04-03 NOTE — Telephone Encounter (Signed)
Blood work ordered for GV 

## 2020-04-08 ENCOUNTER — Other Ambulatory Visit: Payer: Medicare Other

## 2020-04-10 ENCOUNTER — Other Ambulatory Visit (INDEPENDENT_AMBULATORY_CARE_PROVIDER_SITE_OTHER): Payer: Medicare Other

## 2020-04-10 ENCOUNTER — Other Ambulatory Visit: Payer: Self-pay

## 2020-04-10 DIAGNOSIS — R7303 Prediabetes: Secondary | ICD-10-CM

## 2020-04-10 DIAGNOSIS — I10 Essential (primary) hypertension: Secondary | ICD-10-CM | POA: Diagnosis not present

## 2020-04-10 DIAGNOSIS — E782 Mixed hyperlipidemia: Secondary | ICD-10-CM | POA: Diagnosis not present

## 2020-04-10 DIAGNOSIS — Z125 Encounter for screening for malignant neoplasm of prostate: Secondary | ICD-10-CM | POA: Diagnosis not present

## 2020-04-10 LAB — CBC WITH DIFFERENTIAL/PLATELET
Basophils Absolute: 0.1 10*3/uL (ref 0.0–0.1)
Basophils Relative: 0.9 % (ref 0.0–3.0)
Eosinophils Absolute: 0.3 10*3/uL (ref 0.0–0.7)
Eosinophils Relative: 4.5 % (ref 0.0–5.0)
HCT: 43.5 % (ref 39.0–52.0)
Hemoglobin: 14.9 g/dL (ref 13.0–17.0)
Lymphocytes Relative: 22.4 % (ref 12.0–46.0)
Lymphs Abs: 1.6 10*3/uL (ref 0.7–4.0)
MCHC: 34.3 g/dL (ref 30.0–36.0)
MCV: 86.8 fl (ref 78.0–100.0)
Monocytes Absolute: 0.6 10*3/uL (ref 0.1–1.0)
Monocytes Relative: 8 % (ref 3.0–12.0)
Neutro Abs: 4.5 10*3/uL (ref 1.4–7.7)
Neutrophils Relative %: 64.2 % (ref 43.0–77.0)
Platelets: 230 10*3/uL (ref 150.0–400.0)
RBC: 5.02 Mil/uL (ref 4.22–5.81)
RDW: 14 % (ref 11.5–15.5)
WBC: 7 10*3/uL (ref 4.0–10.5)

## 2020-04-10 LAB — COMPREHENSIVE METABOLIC PANEL
ALT: 17 U/L (ref 0–53)
AST: 16 U/L (ref 0–37)
Albumin: 4.3 g/dL (ref 3.5–5.2)
Alkaline Phosphatase: 59 U/L (ref 39–117)
BUN: 21 mg/dL (ref 6–23)
CO2: 30 mEq/L (ref 19–32)
Calcium: 9.4 mg/dL (ref 8.4–10.5)
Chloride: 98 mEq/L (ref 96–112)
Creatinine, Ser: 1.07 mg/dL (ref 0.40–1.50)
GFR: 71.95 mL/min (ref >60.00)
Glucose, Bld: 109 mg/dL — ABNORMAL HIGH (ref 70–99)
Potassium: 3.5 mEq/L (ref 3.5–5.1)
Sodium: 137 mEq/L (ref 135–145)
Total Bilirubin: 0.9 mg/dL (ref 0.2–1.2)
Total Protein: 6.7 g/dL (ref 6.0–8.3)

## 2020-04-10 LAB — LIPID PANEL
Cholesterol: 174 mg/dL (ref 0–200)
HDL: 54.3 mg/dL (ref >39.00)
LDL Cholesterol: 100 mg/dL — ABNORMAL HIGH (ref 0–99)
NonHDL: 119.69
Total CHOL/HDL Ratio: 3
Triglycerides: 98 mg/dL (ref 0.0–149.0)
VLDL: 19.6 mg/dL (ref 0.0–40.0)

## 2020-04-10 LAB — TSH: TSH: 1.76 u[IU]/mL (ref 0.35–4.50)

## 2020-04-10 LAB — PSA, MEDICARE: PSA: 1.65 ng/ml (ref 0.10–4.00)

## 2020-04-10 LAB — HEMOGLOBIN A1C: Hgb A1c MFr Bld: 5.9 % (ref 4.6–6.5)

## 2020-04-15 ENCOUNTER — Telehealth: Payer: Self-pay | Admitting: Internal Medicine

## 2020-04-15 NOTE — Telephone Encounter (Signed)
LVM for pt to rtn my call to schedule AWV with [facility]. Please schedule thi dappt if pt calls the office.

## 2020-04-18 ENCOUNTER — Ambulatory Visit (INDEPENDENT_AMBULATORY_CARE_PROVIDER_SITE_OTHER): Payer: Medicare Other

## 2020-04-18 DIAGNOSIS — Z0000 Encounter for general adult medical examination without abnormal findings: Secondary | ICD-10-CM | POA: Diagnosis not present

## 2020-04-18 NOTE — Patient Instructions (Addendum)
Mr. Nephew , Thank you for taking time to come for your Medicare Wellness Visit. I appreciate your ongoing commitment to your health goals. Please review the following plan we discussed and let me know if I can assist you in the future.   Screening recommendations/referrals: Colonoscopy: 08/10/2011; due every 10 years Recommended yearly ophthalmology/optometry visit for glaucoma screening and checkup Recommended yearly dental visit for hygiene and checkup  Vaccinations: Influenza vaccine: 12/24/2019 Pneumococcal vaccine: up to date Tdap vaccine: 01/29/2014; due every 10 years Shingles vaccine: up to date   Covid-19: up to date  Advanced directives: Please bring a copy of your health care power of attorney and living will to the office at your convenience.  Conditions/risks identified: Yes; Reviewed health maintenance screenings with patient today and relevant education, vaccines, and/or referrals were provided. Please continue to do your personal lifestyle choices by: daily care of teeth and gums, regular physical activity (goal should be 5 days a week for 30 minutes), eat a healthy diet, avoid tobacco and drug use, limiting any alcohol intake, taking a low-dose aspirin (if not allergic or have been advised by your provider otherwise) and taking vitamins and minerals as recommended by your provider. Continue doing brain stimulating activities (puzzles, reading, adult coloring books, staying active) to keep memory sharp. Continue to eat heart healthy diet (full of fruits, vegetables, whole grains, lean protein, water--limit salt, fat, and sugar intake) and increase physical activity as tolerated.  Next appointment: Please schedule your next Medicare Wellness Visit with your Nurse Health Advisor in 1 year by calling [phone]. Preventive Care 68 Years and Older, Male Preventive care refers to lifestyle choices and visits with your health care provider that can promote health and wellness. What  does preventive care include?  A yearly physical exam. This is also called an annual well check.  Dental exams once or twice a year.  Routine eye exams. Ask your health care provider how often you should have your eyes checked.  Personal lifestyle choices, including:  Daily care of your teeth and gums.  Regular physical activity.  Eating a healthy diet.  Avoiding tobacco and drug use.  Limiting alcohol use.  Practicing safe sex.  Taking low doses of aspirin every day.  Taking vitamin and mineral supplements as recommended by your health care provider. What happens during an annual well check? The services and screenings done by your health care provider during your annual well check will depend on your age, overall health, lifestyle risk factors, and family history of disease. Counseling  Your health care provider may ask you questions about your:  Alcohol use.  Tobacco use.  Drug use.  Emotional well-being.  Home and relationship well-being.  Sexual activity.  Eating habits.  History of falls.  Memory and ability to understand (cognition).  Work and work Statistician. Screening  You may have the following tests or measurements:  Height, weight, and BMI.  Blood pressure.  Lipid and cholesterol levels. These may be checked every 5 years, or more frequently if you are over 68 years old.  Skin check.  Lung cancer screening. You may have this screening every year starting at age 68 if you have a 30-pack-year history of smoking and currently smoke or have quit within the past 15 years.  Fecal occult blood test (FOBT) of the stool. You may have this test every year starting at age 68.  Flexible sigmoidoscopy or colonoscopy. You may have a sigmoidoscopy every 5 years or a colonoscopy every 10 years  starting at age 68.  Prostate cancer screening. Recommendations will vary depending on your family history and other risks.  Hepatitis C blood test.  Hepatitis  B blood test.  Sexually transmitted disease (STD) testing.  Diabetes screening. This is done by checking your blood sugar (glucose) after you have not eaten for a while (fasting). You may have this done every 1-3 years.  Abdominal aortic aneurysm (AAA) screening. You may need this if you are a current or former smoker.  Osteoporosis. You may be screened starting at age 68 if you are at high risk. Talk with your health care provider about your test results, treatment options, and if necessary, the need for more tests. Vaccines  Your health care provider may recommend certain vaccines, such as:  Influenza vaccine. This is recommended every year.  Tetanus, diphtheria, and acellular pertussis (Tdap, Td) vaccine. You may need a Td booster every 10 years.  Zoster vaccine. You may need this after age 68.  Pneumococcal 13-valent conjugate (PCV13) vaccine. One dose is recommended after age 68.  Pneumococcal polysaccharide (PPSV23) vaccine. One dose is recommended after age 68. Talk to your health care provider about which screenings and vaccines you need and how often you need them. This information is not intended to replace advice given to you by your health care provider. Make sure you discuss any questions you have with your health care provider. Document Released: 04/04/2015 Document Revised: 11/26/2015 Document Reviewed: 01/07/2015 Elsevier Interactive Patient Education  2017 [place] Prevention in the Home Falls can cause injuries. They can happen to people of all ages. There are many things you can do to make your home safe and to help prevent falls. What can I do on the outside of my home?  Regularly fix the edges of walkways and driveways and fix any cracks.  Remove anything that might make you trip as you walk through a door, such as a raised step or threshold.  Trim any bushes or trees on the path to your home.  Use bright outdoor lighting.  Clear any walking  paths of anything that might make someone trip, such as rocks or tools.  Regularly check to see if handrails are loose or broken. Make sure that both sides of any steps have handrails.  Any raised decks and porches should have guardrails on the edges.  Have any leaves, snow, or ice cleared regularly.  Use sand or salt on walking paths during winter.  Clean up any spills in your garage right away. This includes oil or grease spills. What can I do in the bathroom?  Use night lights.  Install grab bars by the toilet and in the tub and shower. Do not use towel bars as grab bars.  Use non-skid mats or decals in the tub or shower.  If you need to sit down in the shower, use a plastic, non-slip stool.  Keep the floor dry. Clean up any water that spills on the floor as soon as it happens.  Remove soap buildup in the tub or shower regularly.  Attach bath mats securely with double-sided non-slip rug tape.  Do not have throw rugs and other things on the floor that can make you trip. What can I do in the bedroom?  Use night lights.  Make sure that you have a light by your bed that is easy to reach.  Do not use any sheets or blankets that are too big for your bed. They should not hang down  onto the floor.  Have a firm chair that has side arms. You can use this for support while you get dressed.  Do not have throw rugs and other things on the floor that can make you trip. What can I do in the kitchen?  Clean up any spills right away.  Avoid walking on wet floors.  Keep items that you use a lot in easy-to-reach places.  If you need to reach something above you, use a strong step stool that has a grab bar.  Keep electrical cords out of the way.  Do not use floor polish or wax that makes floors slippery. If you must use wax, use non-skid floor wax.  Do not have throw rugs and other things on the floor that can make you trip. What can I do with my stairs?  Do not leave any items  on the stairs.  Make sure that there are handrails on both sides of the stairs and use them. Fix handrails that are broken or loose. Make sure that handrails are as long as the stairways.  Check any carpeting to make sure that it is firmly attached to the stairs. Fix any carpet that is loose or worn.  Avoid having throw rugs at the top or bottom of the stairs. If you do have throw rugs, attach them to the floor with carpet tape.  Make sure that you have a light switch at the top of the stairs and the bottom of the stairs. If you do not have them, ask someone to add them for you. What else can I do to help prevent falls?  Wear shoes that:  Do not have high heels.  Have rubber bottoms.  Are comfortable and fit you well.  Are closed at the toe. Do not wear sandals.  If you use a stepladder:  Make sure that it is fully opened. Do not climb a closed stepladder.  Make sure that both sides of the stepladder are locked into place.  Ask someone to hold it for you, if possible.  Clearly mark and make sure that you can see:  Any grab bars or handrails.  First and last steps.  Where the edge of each step is.  Use tools that help you move around (mobility aids) if they are needed. These include:  Canes.  Walkers.  Scooters.  Crutches.  Turn on the lights when you go into a dark area. Replace any light bulbs as soon as they burn out.  Set up your furniture so you have a clear path. Avoid moving your furniture around.  If any of your floors are uneven, fix them.  If there are any pets around you, be aware of where they are.  Review your medicines with your doctor. Some medicines can make you feel dizzy. This can increase your chance of falling. Ask your doctor what other things that you can do to help prevent falls. This information is not intended to replace advice given to you by your health care provider. Make sure you discuss any questions you have with your health care  provider. Document Released: 01/02/2009 Document Revised: 08/14/2015 Document Reviewed: 04/12/2014 Elsevier Interactive Patient Education  2017 [employer].

## 2020-04-18 NOTE — Progress Notes (Signed)
I connected with James [name] today by telephone and verified that I am speaking with the correct person using two identifiers. Location patient: home Location provider: work Persons participating in the virtual visit: Dayle Lex Jr.and Lisette Abu, LPN.   I discussed the limitations, risks, security and privacy concerns of performing an evaluation and management service by telephone and the availability of in person appointments. I also discussed with the patient that there may be a patient responsible charge related to this service. The patient expressed understanding and verbally consented to this telephonic visit.    Interactive audio and video telecommunications were attempted between this provider and patient, however failed, due to patient having technical difficulties OR patient did not have access to video capability.  We continued and completed visit with audio only.  Some vital signs may be absent or patient reported.   Time Spent with patient on telephone encounter: 30 minutes  Subjective:   James [name] is a 70 y.o. male who presents for Medicare Annual/Subsequent preventive examination.  Review of Systems    No ROS. Medicare Wellness Visit. Additional risk factors are reflected in social history. Cardiac Risk Factors include: advanced age (>76men, >88 women);dyslipidemia;family history of premature cardiovascular disease;hypertension;male gender;obesity (BMI >30kg/m2) Sleep Patterns: No sleep issues, feels rested on waking and sleeps 6-8 hours nightly. Home Safety/Smoke Alarms: Feels safe in home; uses home alarm. Smoke alarms in place. Living environment: 2-story home; Lives with spouse; no needs for DME; good support system. Seat Belt Safety/Bike Helmet: Wears seat belt.    Objective:    There were no vitals filed for this visit. There is no height or weight on file to calculate BMI.  Advanced Directives 04/18/2020 12/11/2014 11/04/2014 05/27/2011  Does  Patient Have a Medical Advance Directive? Yes Yes Yes Patient has advance directive, copy not in chart  Type of Advance Directive Living will;Healthcare Power of [place];Living will Living will (No Data)  Does patient want to make changes to medical advance directive? No - Patient declined No - Patient declined No - Patient declined -  Copy of [place] in Chart? No - copy requested No - copy requested - -    Current Medications (verified) Outpatient Encounter Medications as of 04/18/2020  Medication Sig  . atenolol-chlorthalidone (TENORETIC) 50-25 MG tablet Take 1 tablet by mouth daily.  Marland Kitchen doxazosin (CARDURA) 8 MG tablet Take 1 tablet (8 mg total) by mouth at bedtime.  . potassium chloride SA (KLOR-CON M20) 20 MEQ tablet TAKE TWO TABLETS BY MOUTH TWO TIMES A DAY  . SHINGRIX injection   . simvastatin (ZOCOR) 40 MG tablet Take 1 tablet (40 mg total) by mouth at bedtime.   No facility-administered encounter medications on file as of 04/18/2020.    Allergies (verified) Hydrocodone   History: Past Medical History:  Diagnosis Date  . Angina   . Anxiety    especially revolving around surgery & medical care   . GERD (gastroesophageal reflux disease)   . Heart murmur   . Hemorrhoids   . History of stress test 2013   told it was normal  . Hyperlipidemia   . Hypertension   . Neuromuscular disorder ([place])    spinal stenosis   . Shortness of breath    Past Surgical History:  Procedure Laterality Date  . APPENDECTOMY    . CERVICAL DISC SURGERY    . COLONOSCOPY    . EYE SURGERY  09&12/2009   x3- (total of  7 eye surgery)   . LUMBAR LAMINECTOMY  1990's   x2  . TESTICLE TORSION REDUCTION    . UPPER GASTROINTESTINAL ENDOSCOPY     Family History  Problem Relation Age of Onset  . Cancer Mother        skin cancer/melanoma  . Hypertension Father   . Heart disease Father   . Colon cancer Neg Hx    Social History   Socioeconomic  History  . Marital status: Married    Spouse name: Not on file  . Number of children: Not on file  . Years of education: Not on file  . Highest education level: Not on file  Occupational History  . Not on file  Tobacco Use  . Smoking status: Never Smoker  . Smokeless tobacco: Never Used  Substance and Sexual Activity  . Alcohol use: Yes    Comment: 1 bottle wine a week, on weekends   . Drug use: Yes    Types: Marijuana    Comment: recreationally- early August- 2016  . Sexual activity: Yes  Other Topics Concern  . Not on file  Social History Narrative   [employer] driver    No tobacco   No ets    Married    Wife works Scientist, research (life sciences)   Social Determinants of Radio broadcast assistant Strain: Low Risk   . Difficulty of Paying Living Expenses: Not hard at all  Food Insecurity: No Food Insecurity  . Worried About Charity fundraiser in the Last Year: Never true  . Ran Out of Food in the Last Year: Never true  Transportation Needs: No Transportation Needs  . Lack of Transportation (Medical): No  . Lack of Transportation (Non-Medical): No  Physical Activity: Sufficiently Active  . Days of Exercise per Week: 7 days  . Minutes of Exercise per Session: 60 min  Stress: No Stress Concern Present  . Feeling of Stress : Not at all  Social Connections: Socially Integrated  . Frequency of Communication with Friends and Family: More than three times a week  . Frequency of Social Gatherings with Friends and Family: More than three times a week  . Attends Religious Services: 1 to 4 times per year  . Active Member of Clubs or Organizations: Yes  . Attends Archivist Meetings: 1 to 4 times per year  . Marital Status: Married    Tobacco Counseling Counseling given: Not Answered   Clinical Intake:  Pre-visit preparation completed: Yes  Pain : No/denies pain     Nutritional Risks: None Diabetes: No  How often do you need to have someone help you when you read  instructions, pamphlets, or other written materials from your doctor or pharmacy?: 1 - Never What is the last grade level you completed in school?: College Degree  Diabetic? no  Interpreter Needed?: No  Information entered by :: Lisette Abu, LPN   Activities of Daily Living In your present state of health, do you have any difficulty performing the following activities: 04/18/2020  Hearing? N  Vision? N  Difficulty concentrating or making decisions? N  Walking or climbing stairs? N  Dressing or bathing? N  Doing errands, shopping? N  Preparing Food and eating ? N  Using the Toilet? N  In the past six months, have you accidently leaked urine? N  Do you have problems with loss of bowel control? N  Managing your Medications? N  Managing your Finances? N  Housekeeping or managing your Housekeeping?  N  Some recent data might be hidden    Patient Care Team: Binnie Rail, MD as PCP - General (Internal Medicine)  Indicate any recent Medical Services you may have received from other than [facility] providers in the past year (date may be approximate).     Assessment:   This is a routine wellness examination for [facility].  Hearing/Vision screen No exam data present  Dietary issues and exercise activities discussed: Current Exercise Habits: Home exercise routine, Type of exercise: walking (Walks 2 miles everyday), Time (Minutes): 60, Frequency (Times/Week): 7, Weekly Exercise (Minutes/Week): 420, Intensity: Moderate, Exercise limited by: cardiac condition(s);neurologic condition(s);Other - see comments (spinal stenosis)  Goals    . Patient Stated     To maintain my current health status by continuing to eat healthy, stay physically active and socially active.      Depression Screen PHQ 2/9 Scores 04/18/2020 04/17/2019 01/27/2017  PHQ - 2 Score 0 0 0    Fall Risk Fall Risk  04/18/2020 04/17/2019 01/27/2017  Falls in the past year? 0 0 No  Number falls in past yr: 0 0 -  Injury with  Fall? 0 - -  Risk for fall due to : No Fall Risks - -    FALL RISK PREVENTION PERTAINING TO THE HOME:  Any stairs in or around the home? Yes  If so, are there any without handrails? No  Home free of loose throw rugs in walkways, pet beds, electrical cords, etc? Yes  Adequate lighting in your home to reduce risk of falls? Yes   ASSISTIVE DEVICES UTILIZED TO PREVENT FALLS:  Life alert? No  Use of a cane, walker or w/c? No  Grab bars in the bathroom? Yes  Shower chair or bench in shower? Yes  Elevated toilet seat or a handicapped toilet? No   TIMED UP AND GO:  Was the test performed? No .  Length of time to ambulate 10 feet: 0 sec.   Gait steady and fast without use of assistive device  Cognitive Function: No flowsheet data found.         Immunizations Immunization History  Administered Date(s) Administered  . Influenza Whole 12/21/2006, 12/20/2008  . Influenza, Quadrivalent, Recombinant, Inj, Pf 12/15/2017  . Influenza,inj,Quad PF,6+ Mos 01/02/2013, 01/22/2015, 01/13/2016, 01/13/2017, 12/21/2018  . Influenza-Unspecified 01/10/2014, 01/09/2016  . PFIZER(Purple Top)SARS-COV-2 Vaccination 05/24/2019, 06/19/2019  . Pneumococcal Conjugate-13 04/12/2018  . Pneumococcal Polysaccharide-23 04/17/2019  . Td 03/23/2003  . Tdap 01/29/2014  . Zoster 01/02/2013    TDAP status: Up to date  Flu Vaccine status: Up to date  Pneumococcal vaccine status: Up to date  Covid-19 vaccine status: Completed vaccines  Qualifies for Shingles Vaccine? Yes   Zostavax completed Yes   Shingrix Completed?: Yes  Screening Tests Health Maintenance  Topic Date Due  . COVID-19 Vaccine (3 - Booster for Pfizer series) 12/20/2019  . COLONOSCOPY (Pts 45-44yrs Insurance coverage will need to be confirmed)  08/09/2021  . TETANUS/TDAP  01/30/2024  . INFLUENZA VACCINE  Completed  . Hepatitis C Screening  Completed  . PNA vac Low Risk Adult  Completed    Health Maintenance  Health Maintenance  Due  Topic Date Due  . COVID-19 Vaccine (3 - Booster for Pfizer series) 12/20/2019    Colorectal cancer screening: Type of screening: Colonoscopy. Completed 08/10/2011. Repeat every 10 years  Lung Cancer Screening: (Low Dose CT Chest recommended if Age 38-80 years, 30 pack-year currently smoking OR have quit w/in 15years.) does not qualify.   Lung  Cancer Screening Referral: no  Additional Screening:  Hepatitis C Screening: does qualify; Completed yes  Vision Screening: Recommended annual ophthalmology exams for early detection of glaucoma and other disorders of the eye. Is the patient up to date with their annual eye exam?  Yes  Who is the provider or what is the name of the office in which the patient attends annual eye exams? Deloria Lair, MD. If pt is not established with a provider, would they like to be referred to a provider to establish care? No .   Dental Screening: Recommended annual dental exams for proper oral hygiene  Community Resource Referral / Chronic Care Management: CRR required this visit?  No   CCM required this visit?  No      Plan:     I have personally reviewed and noted the following in the patient's chart:   . Medical and social history . Use of alcohol, tobacco or illicit drugs  . Current medications and supplements . Functional ability and status . Nutritional status . Physical activity . Advanced directives . List of other physicians . Hospitalizations, surgeries, and ER visits in previous 12 months . Vitals . Screenings to include cognitive, depression, and falls . Referrals and appointments  In addition, I have reviewed and discussed with patient certain preventive protocols, quality metrics, and best practice recommendations. A written personalized care plan for preventive services as well as general preventive health recommendations were provided to patient.     Sheral Flow, LPN   [id number]   Nurse Notes:  Patient is  cogitatively intact. There were no vitals filed for this visit. There is no height or weight on file to calculate BMI. Patient stated that he has no issues with gait or balance; does not use any assistive devices. Patient is very active with walking 2 miles everyday and stair climbing.

## 2020-04-20 NOTE — Progress Notes (Signed)
Subjective:    Patient ID: James Meeker., male    DOB: 1950/12/11, 70 y.o.   MRN: [id number]  HPI The patient is here for follow up of their chronic medical problems, including htn, GERD, hyperlipidemia, prediabetes  He walks 2 miles a day. He did lose about 20 pounds, but regained about 10. He knows he needs to get that weight off again. His wife is losing weight and he has been eating much healthier.  In the mornings he will sometimes cough up some white mucus or phlegm. Once he does that he is fine the rest of the day. He denies any GERD.   Medications and allergies reviewed with patient and updated if appropriate.  Patient Active Problem List   Diagnosis Date Noted  . Left epiretinal membrane 12/06/2019  . Vitreous hemorrhage of right eye ([place]) 12/06/2019  . 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 Degenerative retinal drusen of right eye 12/06/2019  . Posterior vitreous detachment of left eye 12/06/2019  . History of retinal detachment 12/06/2019  . Dupuytren's contracture of left hand 01/27/2017  . Prediabetes 01/27/2017  . Hypokalemia 01/27/2016  . Lumbar spondylosis 12/20/2014  . Hyperlipidemia 04/07/2007  . Essential hypertension 04/07/2007  . GERD 04/07/2007    Current Outpatient Medications on File Prior to Visit  Medication Sig Dispense Refill  . atenolol-chlorthalidone (TENORETIC) 50-25 MG tablet Take 1 tablet by mouth daily. 90 tablet 3  . doxazosin (CARDURA) 8 MG tablet Take 1 tablet (8 mg total) by mouth at bedtime. 90 tablet 3  . potassium chloride SA (KLOR-CON M20) 20 MEQ tablet TAKE TWO TABLETS BY MOUTH TWO TIMES A DAY 360 tablet 3  . SHINGRIX injection     . simvastatin (ZOCOR) 40 MG tablet Take 1 tablet (40 mg total) by mouth at bedtime. 90 tablet 3   No current facility-administered medications on file prior to visit.    Past Medical History:  Diagnosis Date  . Angina   . Anxiety    especially revolving around surgery & medical care   . GERD  (gastroesophageal reflux disease)   . Heart murmur   . Hemorrhoids   . History of stress test 2013   told it was normal  . Hyperlipidemia   . Hypertension   . Neuromuscular disorder ([place])    spinal stenosis   . Shortness of breath     Past Surgical History:  Procedure Laterality Date  . APPENDECTOMY    . CERVICAL DISC SURGERY    . COLONOSCOPY    . EYE SURGERY  09&12/2009   x3- (total of 7 eye surgery)   . LUMBAR LAMINECTOMY  1990's   x2  . TESTICLE TORSION REDUCTION    . UPPER GASTROINTESTINAL ENDOSCOPY      Social History   Socioeconomic History  . Marital status: Married    Spouse name: Not on file  . Number of children: Not on file  . Years of education: Not on file  . Highest education level: Not on file  Occupational History  . Not on file  Tobacco Use  . Smoking status: Never Smoker  . Smokeless tobacco: Never Used  Substance and Sexual Activity  . Alcohol use: Yes    Comment: 1 bottle wine a week, on weekends   . Drug use: Yes    Types: Marijuana    Comment: recreationally- early August- 2016  . Sexual activity: Yes  Other Topics Concern  . Not on file  Social History Narrative  [employer] driver    No tobacco   No ets    Married    Wife works Scientist, research (life sciences)   Social Determinants of Radio broadcast assistant Strain: Low Risk   . Difficulty of Paying Living Expenses: Not hard at all  Food Insecurity: No Food Insecurity  . Worried About Charity fundraiser in the Last Year: Never true  . Ran Out of Food in the Last Year: Never true  Transportation Needs: No Transportation Needs  . Lack of Transportation (Medical): No  . Lack of Transportation (Non-Medical): No  Physical Activity: Sufficiently Active  . Days of Exercise per Week: 7 days  . Minutes of Exercise per Session: 60 min  Stress: No Stress Concern Present  . Feeling of Stress : Not at all  Social Connections: Socially Integrated  . Frequency of Communication with Friends and  Family: More than three times a week  . Frequency of Social Gatherings with Friends and Family: More than three times a week  . Attends Religious Services: 1 to 4 times per year  . Active Member of Clubs or Organizations: Yes  . Attends Archivist Meetings: 1 to 4 times per year  . Marital Status: Married    Family History  Problem Relation Age of Onset  . Cancer Mother        skin cancer/melanoma  . Hypertension Father   . Heart disease Father   . Colon cancer Neg Hx     Review of Systems  Constitutional: Negative for fever.  HENT: Negative for sore throat, trouble swallowing and voice change.   Respiratory: Positive for cough (in morning only - white sputum/mucus). Negative for shortness of breath and wheezing.   Cardiovascular: Negative for chest pain, palpitations and leg swelling.  Gastrointestinal: Negative for abdominal pain, blood in stool, constipation, diarrhea and nausea.       Feels gerd is well controlled  Musculoskeletal: Negative for arthralgias and back pain.  Neurological: Negative for light-headedness and headaches.       Objective:   Vitals:   04/21/20 1433  BP: 140/78  Pulse: 80  Temp: 98.1 F (36.7 C)  SpO2: 96%   BP Readings from Last 3 Encounters:  04/21/20 140/78  04/17/19 138/82  04/12/18 124/78   Wt Readings from Last 3 Encounters:  04/21/20 243 lb (110.2 kg)  04/17/19 251 lb (113.9 kg)  04/12/18 249 lb 12.8 oz (113.3 kg)   Body mass index is 29.58 kg/m.   Physical Exam    Constitutional: Appears well-developed and well-nourished. No distress.  HENT:  Head: Normocephalic and atraumatic.  Neck: Neck supple. No tracheal deviation present. No thyromegaly present.  No cervical lymphadenopathy Cardiovascular: Normal rate, regular rhythm and normal heart sounds.   No murmur heard. No carotid bruit .  No edema Pulmonary/Chest: Effort normal and breath sounds normal. No respiratory distress. No has no wheezes. No rales. Abdomen:  Soft, nontender, nondistended Skin: Skin is warm and dry. Not diaphoretic.  Psychiatric: Normal mood and affect. Behavior is normal.      Assessment & Plan:    See Problem List for Assessment and Plan of chronic medical problems.    This visit occurred during the SARS-CoV-2 public health emergency.  Safety protocols were in place, including screening questions prior to the visit, additional usage of staff PPE, and extensive cleaning of exam room while observing appropriate contact time as indicated for disinfecting solutions.

## 2020-04-20 NOTE — Patient Instructions (Addendum)
Medications changes include :  none   Your prescription(s) have been submitted to your pharmacy. Please take as directed and contact our office if you believe you are having problem(s) with the medication(s).   Please followup in 1 year   Health Maintenance, Male Adopting a healthy lifestyle and getting preventive care are important in promoting health and wellness. Ask your health care provider about:  The right schedule for you to have regular tests and exams.  Things you can do on your own to prevent diseases and keep yourself healthy. What should I know about diet, weight, and exercise? Eat a healthy diet  Eat a diet that includes plenty of vegetables, fruits, low-fat dairy products, and lean protein.  Do not eat a lot of foods that are high in solid fats, added sugars, or sodium.   Maintain a healthy weight Body mass index (BMI) is a measurement that can be used to identify possible weight problems. It estimates body fat based on height and weight. Your health care provider can help determine your BMI and help you achieve or maintain a healthy weight. Get regular exercise Get regular exercise. This is one of the most important things you can do for your health. Most adults should:  Exercise for at least 150 minutes each week. The exercise should increase your heart rate and make you sweat (moderate-intensity exercise).  Do strengthening exercises at least twice a week. This is in addition to the moderate-intensity exercise.  Spend less time sitting. Even light physical activity can be beneficial. Watch cholesterol and blood lipids Have your blood tested for lipids and cholesterol at 68 years of age, then have this test every 5 years. You may need to have your cholesterol levels checked more often if:  Your lipid or cholesterol levels are high.  You are older than 68 years of age.  You are at high risk for heart disease. What should I know about cancer screening? Many  types of cancers can be detected early and may often be prevented. Depending on your health history and family history, you may need to have cancer screening at various ages. This may include screening for:  Colorectal cancer.  Prostate cancer.  Skin cancer.  Lung cancer. What should I know about heart disease, diabetes, and high blood pressure? Blood pressure and heart disease  High blood pressure causes heart disease and increases the risk of stroke. This is more likely to develop in people who have high blood pressure readings, are of African descent, or are overweight.  Talk with your health care provider about your target blood pressure readings.  Have your blood pressure checked: ? Every 3-5 years if you are 65-27 years of age. ? Every year if you are 26 years old or older.  If you are between the ages of 23 and 54 and are a current or former smoker, ask your health care provider if you should have a one-time screening for abdominal aortic aneurysm (AAA). Diabetes Have regular diabetes screenings. This checks your fasting blood sugar level. Have the screening done:  Once every three years after age 74 if you are at a normal weight and have a low risk for diabetes.  More often and at a younger age if you are overweight or have a high risk for diabetes. What should I know about preventing infection? Hepatitis B If you have a higher risk for hepatitis B, you should be screened for this virus. Talk with your health care provider to find  out if you are at risk for hepatitis B infection. Hepatitis C Blood testing is recommended for:  Everyone born from [birth year] through 1963.  Anyone with known risk factors for hepatitis C. Sexually transmitted infections (STIs)  You should be screened each year for STIs, including gonorrhea and chlamydia, if: ? You are sexually active and are younger than 68 years of age. ? You are older than 68 years of age and your health care provider tells you  that you are at risk for this type of infection. ? Your sexual activity has changed since you were last screened, and you are at increased risk for chlamydia or gonorrhea. Ask your health care provider if you are at risk.  Ask your health care provider about whether you are at high risk for HIV. Your health care provider may recommend a prescription medicine to help prevent HIV infection. If you choose to take medicine to prevent HIV, you should first get tested for HIV. You should then be tested every 3 months for as long as you are taking the medicine. Follow these instructions at home: Lifestyle  Do not use any products that contain nicotine or tobacco, such as cigarettes, e-cigarettes, and chewing tobacco. If you need help quitting, ask your health care provider.  Do not use street drugs.  Do not share needles.  Ask your health care provider for help if you need support or information about quitting drugs. Alcohol use  Do not drink alcohol if your health care provider tells you not to drink.  If you drink alcohol: ? Limit how much you have to 0-2 drinks a day. ? Be aware of how much alcohol is in your drink. In the [place] one drink equals one 12 oz bottle of beer (355 mL), one 5 oz glass of wine (148 mL), or one 1 oz glass of hard liquor (44 mL). General instructions  Schedule regular health, dental, and eye exams.  Stay current with your vaccines.  Tell your health care provider if: ? You often feel depressed. ? You have ever been abused or do not feel safe at home. Summary  Adopting a healthy lifestyle and getting preventive care are important in promoting health and wellness.  Follow your health care provider's instructions about healthy diet, exercising, and getting tested or screened for diseases.  Follow your health care provider's instructions on monitoring your cholesterol and blood pressure. This information is not intended to replace advice given to you by your  health care provider. Make sure you discuss any questions you have with your health care provider. Document Revised: 03/01/2018 Document Reviewed: 03/01/2018 Elsevier Patient Education  2021 [employer].

## 2020-04-21 ENCOUNTER — Encounter: Payer: Self-pay | Admitting: Internal Medicine

## 2020-04-21 ENCOUNTER — Ambulatory Visit (INDEPENDENT_AMBULATORY_CARE_PROVIDER_SITE_OTHER): Payer: Medicare Other | Admitting: Internal Medicine

## 2020-04-21 ENCOUNTER — Other Ambulatory Visit: Payer: Self-pay

## 2020-04-21 VITALS — BP 140/78 | HR 80 | Temp 98.1°F | Ht 76.0 in | Wt 243.0 lb

## 2020-04-21 DIAGNOSIS — R7303 Prediabetes: Secondary | ICD-10-CM

## 2020-04-21 DIAGNOSIS — K219 Gastro-esophageal reflux disease without esophagitis: Secondary | ICD-10-CM | POA: Diagnosis not present

## 2020-04-21 DIAGNOSIS — E876 Hypokalemia: Secondary | ICD-10-CM | POA: Diagnosis not present

## 2020-04-21 DIAGNOSIS — I10 Essential (primary) hypertension: Secondary | ICD-10-CM | POA: Diagnosis not present

## 2020-04-21 DIAGNOSIS — E782 Mixed hyperlipidemia: Secondary | ICD-10-CM

## 2020-04-21 DIAGNOSIS — E785 Hyperlipidemia, unspecified: Secondary | ICD-10-CM | POA: Diagnosis not present

## 2020-04-21 MED ORDER — SIMVASTATIN 40 MG PO TABS: 40.0000 mg | ORAL_TABLET | Freq: Every day | ORAL | 3 refills | Status: DC

## 2020-04-21 MED ORDER — ATENOLOL-CHLORTHALIDONE 50-25 MG PO TABS: 1.0000 | ORAL_TABLET | Freq: Every day | ORAL | 3 refills | Status: DC

## 2020-04-21 MED ORDER — DOXAZOSIN MESYLATE 8 MG PO TABS: 8.0000 mg | ORAL_TABLET | Freq: Every day | ORAL | 3 refills | Status: DC

## 2020-04-21 MED ORDER — POTASSIUM CHLORIDE CRYS ER 20 MEQ PO TBCR: EXTENDED_RELEASE_TABLET | ORAL | 3 refills | Status: DC

## 2020-04-21 NOTE — Assessment & Plan Note (Signed)
Chronic BP controlled Continue atenolol-chlorthalidone 50-25 mg daily, doxazosin 8 mg at bedtime CMP, CBC, TSH reviewed

## 2020-04-21 NOTE — Assessment & Plan Note (Signed)
Chronic Related to chlorthalidone Continue potassium 40 mEq twice daily CMP reviewed

## 2020-04-21 NOTE — Assessment & Plan Note (Signed)
Chronic Check lipid panel, CMP, TSH Continue simvastatin 40 mg daily Regular exercise and healthy diet encouraged

## 2020-04-21 NOTE — Assessment & Plan Note (Signed)
Chronic He does take over-the-counter omeprazole as needed and feels his GERD is controlled Discussed that his coughing first thing in the morning could be related to reflux Encouraged him to lose the 10 pounds he gained back Advised him to keep in mind that this could be reflux

## 2020-04-21 NOTE — Assessment & Plan Note (Signed)
Chronic Lab Results  Component Value Date   HGBA1C 5.9 04/10/2020   Continue regular exercise Encouraged weight loss Continue low sugar/carbohydrate diet

## 2020-04-29 ENCOUNTER — Telehealth: Payer: Self-pay

## 2020-04-29 DIAGNOSIS — H34212 Partial retinal artery occlusion, left eye: Secondary | ICD-10-CM | POA: Diagnosis not present

## 2020-04-29 DIAGNOSIS — H34232 Retinal artery branch occlusion, left eye: Secondary | ICD-10-CM

## 2020-04-29 DIAGNOSIS — Z961 Presence of intraocular lens: Secondary | ICD-10-CM | POA: Diagnosis not present

## 2020-04-29 DIAGNOSIS — H40013 Open angle with borderline findings, low risk, bilateral: Secondary | ICD-10-CM | POA: Diagnosis not present

## 2020-04-29 DIAGNOSIS — H26492 Other secondary cataract, left eye: Secondary | ICD-10-CM | POA: Diagnosis not present

## 2020-04-29 NOTE — Telephone Encounter (Signed)
I ordered both tests - someone will call him to schedule   Make sure he is monitor his BP  - we now need to make sure it is very well controlled.

## 2020-04-30 NOTE — Telephone Encounter (Signed)
Spoke with patient today. 

## 2020-04-30 NOTE — Telephone Encounter (Signed)
I think that is acceptable.  Start taking a baby aspirin daily

## 2020-05-20 ENCOUNTER — Ambulatory Visit (HOSPITAL_COMMUNITY)
Admission: RE | Admit: 2020-05-20 | Discharge: 2020-05-20 | Disposition: A | Discharge status: Home / Self Care | Payer: Medicare Other | Source: Ambulatory Visit | Attending: Cardiology | Admitting: Cardiology

## 2020-05-20 ENCOUNTER — Other Ambulatory Visit: Payer: Self-pay

## 2020-05-20 ENCOUNTER — Other Ambulatory Visit (HOSPITAL_COMMUNITY): Payer: Self-pay | Admitting: Internal Medicine

## 2020-05-20 DIAGNOSIS — I6522 Occlusion and stenosis of left carotid artery: Secondary | ICD-10-CM

## 2020-05-20 DIAGNOSIS — H34232 Retinal artery branch occlusion, left eye: Secondary | ICD-10-CM | POA: Diagnosis not present

## 2020-05-21 ENCOUNTER — Other Ambulatory Visit: Payer: Self-pay | Admitting: Internal Medicine

## 2020-05-21 ENCOUNTER — Ambulatory Visit (HOSPITAL_COMMUNITY): Payer: Medicare Other | Attending: Cardiology

## 2020-05-21 ENCOUNTER — Encounter: Payer: Self-pay | Admitting: Internal Medicine

## 2020-05-21 DIAGNOSIS — I6522 Occlusion and stenosis of left carotid artery: Secondary | ICD-10-CM

## 2020-05-21 DIAGNOSIS — H34232 Retinal artery branch occlusion, left eye: Secondary | ICD-10-CM | POA: Insufficient documentation

## 2020-05-21 LAB — ECHOCARDIOGRAM COMPLETE
Area-P 1/2: 3.85 cm2
S' Lateral: 3.1 cm

## 2020-05-22 ENCOUNTER — Encounter: Payer: Self-pay | Admitting: Internal Medicine

## 2020-05-22 DIAGNOSIS — I517 Cardiomegaly: Secondary | ICD-10-CM | POA: Insufficient documentation

## 2020-05-29 ENCOUNTER — Other Ambulatory Visit: Payer: Self-pay

## 2020-05-29 ENCOUNTER — Ambulatory Visit (INDEPENDENT_AMBULATORY_CARE_PROVIDER_SITE_OTHER): Payer: Medicare Other | Admitting: Vascular Surgery

## 2020-05-29 ENCOUNTER — Encounter: Payer: Self-pay | Admitting: Vascular Surgery

## 2020-05-29 VITALS — BP 117/79 | HR 63 | Temp 98.1°F | Resp 20 | Ht 76.0 in | Wt 240.0 lb

## 2020-05-29 DIAGNOSIS — I6522 Occlusion and stenosis of left carotid artery: Secondary | ICD-10-CM | POA: Diagnosis not present

## 2020-05-29 NOTE — Progress Notes (Signed)
Referring Physician: Dr. Quay Burow  Patient name: James [name] 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 is a 70 y.o. 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  He has had no symptoms of TIA amaurosis or stroke.  He has had no visual changes.  He did have a detached retina in both eyes several years ago.  He has not had any problems recently.  After his recent exam he was started on aspirin by Dr. Quay Burow.  He is also on a statin.  He has never had a TIA or stroke.  He has no chest pain or shortness of breath.  He eats overall a fairly healthy diet and tries to avoid red meat and eats lots of fruits and vegetables.  He walks several miles each day.  He is overall pretty active.  Other medical problems include.  Past Medical History:  Diagnosis Date  . Angina   . Anxiety    especially revolving around surgery & medical care   . GERD (gastroesophageal reflux disease)   . Heart murmur   . Hemorrhoids   . History of stress test 2013   told it was normal  . Hyperlipidemia   . Hypertension   . Neuromuscular disorder ([place])    spinal stenosis   . Shortness of breath    Past Surgical History:  Procedure Laterality Date  . APPENDECTOMY    . CERVICAL DISC SURGERY    . COLONOSCOPY    . EYE SURGERY  09&12/2009   x3- (total of 7 eye surgery)   . LUMBAR LAMINECTOMY  1990's   x2  . TESTICLE TORSION REDUCTION    . UPPER GASTROINTESTINAL ENDOSCOPY      Family History  Problem Relation Age of Onset  . Cancer Mother        skin cancer/melanoma  . Hypertension Father   . Heart disease Father   . Colon cancer Neg Hx     SOCIAL HISTORY: Social History   Socioeconomic History  . Marital status: Married    Spouse name: Not on file  . Number of children: Not on file  . Years of education: Not on file  . Highest education level: Not on file  Occupational  History  . Not on file  Tobacco Use  . Smoking status: Never Smoker  . Smokeless tobacco: Never Used  Vaping Use  . Vaping Use: Never used  Substance and Sexual Activity  . Alcohol use: Yes    Comment: 1 bottle wine a week, on weekends   . Drug use: Not Currently    Types: Marijuana    Comment: recreationally- early August- 2016  . Sexual activity: Yes  Other Topics Concern  . Not on file  Social History Narrative   [employer] driver    No tobacco   No ets    Married    Wife works Scientist, research (life sciences)   Social Determinants of Radio broadcast assistant Strain: Low Risk   . Difficulty of Paying Living Expenses: Not hard at all  Food Insecurity: No Food Insecurity  . Worried About Charity fundraiser in the Last Year: Never true  . Ran Out of Food in the Last Year: Never true  Transportation Needs: No Transportation Needs  . Lack of Transportation (Medical): No  . Lack of Transportation (Non-Medical): No  Physical Activity: Sufficiently  Active  . Days of Exercise per Week: 7 days  . Minutes of Exercise per Session: 60 min  Stress: No Stress Concern Present  . Feeling of Stress : Not at all  Social Connections: Socially Integrated  . Frequency of Communication with Friends and Family: More than three times a week  . Frequency of Social Gatherings with Friends and Family: More than three times a week  . Attends Religious Services: 1 to 4 times per year  . Active Member of Clubs or Organizations: Yes  . Attends Archivist Meetings: 1 to 4 times per year  . Marital Status: Married  Human resources officer Violence: Not on file    Allergies  Allergen Reactions  . Hydrocodone Itching    Current Outpatient Medications  Medication Sig Dispense Refill  . atenolol-chlorthalidone (TENORETIC) 50-25 MG tablet Take 1 tablet by mouth daily. 90 tablet 3  . doxazosin (CARDURA) 8 MG tablet Take 1 tablet (8 mg total) by mouth at bedtime. 90 tablet 3  . potassium chloride SA  (KLOR-CON M20) 20 MEQ tablet TAKE TWO TABLETS BY MOUTH TWO TIMES A DAY 360 tablet 3  . simvastatin (ZOCOR) 40 MG tablet Take 1 tablet (40 mg total) by mouth at bedtime. 90 tablet 3   No current facility-administered medications for this visit.    ROS:   General:  No weight loss, Fever, chills  HEENT: No recent headaches, no nasal bleeding, no visual changes, no sore throat  Neurologic: No dizziness, blackouts, seizures. No recent symptoms of stroke or mini- stroke. No recent episodes of slurred speech, or temporary blindness.  Cardiac: No recent episodes of chest pain/pressure, no shortness of breath at rest.  No shortness of breath with exertion.  Denies history of atrial fibrillation or irregular heartbeat  Vascular: No history of rest pain in feet.  No history of claudication.  No history of non-healing ulcer, No history of DVT   Pulmonary: No home oxygen, no productive cough, no hemoptysis,  No asthma or wheezing  Musculoskeletal:  [ ]  Arthritis, [ ]  Low back pain,  [ ]  Joint pain  Hematologic:No history of hypercoagulable state.  No history of easy bleeding.  No history of anemia  Gastrointestinal: No hematochezia or melena,  No gastroesophageal reflux, no trouble swallowing  Urinary: [ ]  chronic Kidney disease, [ ]  on HD - [ ]  MWF or [ ]  TTHS, [ ]  Burning with urination, [ ]  Frequent urination, [ ]  Difficulty urinating;   Skin: No rashes  Psychological: No history of anxiety,  No history of depression   Physical Examination  Vitals:   05/29/20 1052 05/29/20 1054  BP: 120/83 117/79  Pulse: 63   Resp: 20   Temp: 98.1 F (36.7 C)   SpO2: 95%   Weight: 240 lb (108.9 kg)   Height: 6\' 4"  (1.93 m)     Body mass index is 29.21 kg/m.  General:  Alert and oriented, no acute distress HEENT: Normal Neck: No JVD Cardiac: Regular Rate and Rhythm Abdomen: Soft, non-tender, non-distended, no mass Skin: No rash Extremity Pulses:  2+ radial, brachial, femoral, dorsalis  pedis, posterior tibial pulses bilaterally Musculoskeletal: No deformity or edema  Neurologic: Upper and lower extremity motor 5/5 and symmetric  DATA:  I reviewed the patient's recent carotid duplex exam dated May 21, 2020.  This showed a 40 to 60% left internal carotid artery stenosis no significant right-sided stenosis  ASSESSMENT: Asymptomatic mild to moderate left internal carotid artery stenosis.  Although patient did  have evidence of Hollenhorst plaques on known timing of this.  He never had an episode of amaurosis.  He was not previously on medical management.   PLAN: I would continue to treat the patient with aspirin and statin as you are currently doing.  If he has another event certainly would revisit whether or not left carotid endarterectomy or carotid stenting would benefit him.  We will schedule him for a follow-up carotid duplex scan and be seen in our APP clinic in 6 months.  He will return sooner if he has any events.   James Hinds, MD [facility] Office: [phone]

## 2020-06-02 ENCOUNTER — Other Ambulatory Visit: Payer: Self-pay

## 2020-06-02 DIAGNOSIS — I6522 Occlusion and stenosis of left carotid artery: Secondary | ICD-10-CM

## 2020-08-25 ENCOUNTER — Telehealth: Payer: Self-pay | Admitting: Internal Medicine

## 2020-08-25 NOTE — Chronic Care Management (AMB) (Signed)
  Chronic Care Management   Note  08/25/2020 Name: James [name] MRN: [id number] DOB: 02/14/1951  James [name] is a 70 y.o. year old male who is a primary care patient of Burns, Claudina Lick, MD. I reached out to [name]. by phone today in response to a referral sent by [name]'s PCP, Quay Burow, Claudina Lick, MD.   [name] was given information about Chronic Care Management services today including:  1. CCM service includes personalized support from designated clinical staff supervised by his physician, including individualized plan of care and coordination with other care providers 2. 24/7 contact phone numbers for assistance for urgent and routine care needs. 3. Service will only be billed when office clinical staff spend 20 minutes or more in a month to coordinate care. 4. Only one practitioner may furnish and bill the service in a calendar month. 5. The patient may stop CCM services at any time (effective at the end of the month) by phone call to the office staff.   Patient agreed to services and verbal consent obtained.   Follow up plan:   Lauretta Grill Upstream Scheduler

## 2020-09-11 DIAGNOSIS — L578 Other skin changes due to chronic exposure to nonionizing radiation: Secondary | ICD-10-CM | POA: Diagnosis not present

## 2020-09-11 DIAGNOSIS — L821 Other seborrheic keratosis: Secondary | ICD-10-CM | POA: Diagnosis not present

## 2020-09-11 DIAGNOSIS — L814 Other melanin hyperpigmentation: Secondary | ICD-10-CM | POA: Diagnosis not present

## 2020-09-11 DIAGNOSIS — D2272 Melanocytic nevi of left lower limb, including hip: Secondary | ICD-10-CM | POA: Diagnosis not present

## 2020-09-11 DIAGNOSIS — D225 Melanocytic nevi of trunk: Secondary | ICD-10-CM | POA: Diagnosis not present

## 2020-09-11 DIAGNOSIS — D2262 Melanocytic nevi of left upper limb, including shoulder: Secondary | ICD-10-CM | POA: Diagnosis not present

## 2020-09-11 DIAGNOSIS — D485 Neoplasm of uncertain behavior of skin: Secondary | ICD-10-CM | POA: Diagnosis not present

## 2020-09-11 DIAGNOSIS — D0339 Melanoma in situ of other parts of face: Secondary | ICD-10-CM | POA: Diagnosis not present

## 2020-10-02 ENCOUNTER — Ambulatory Visit (INDEPENDENT_AMBULATORY_CARE_PROVIDER_SITE_OTHER): Payer: Medicare Other

## 2020-10-02 ENCOUNTER — Other Ambulatory Visit: Payer: Self-pay

## 2020-10-02 DIAGNOSIS — I10 Essential (primary) hypertension: Secondary | ICD-10-CM | POA: Diagnosis not present

## 2020-10-02 DIAGNOSIS — E782 Mixed hyperlipidemia: Secondary | ICD-10-CM | POA: Diagnosis not present

## 2020-10-02 NOTE — Patient Instructions (Signed)
Visit Information   PATIENT GOALS:   Goals Addressed             This Visit's Progress    Track and Manage My Blood Pressure-Hypertension       Timeframe:  Long-Range Goal Priority:  High Start Date:  10/02/2020                           Expected End Date: 05/03/2021                      Follow Up Date 04/28/2021   - check blood pressure weekly - choose a place to take my blood pressure (home, clinic or office, retail store) - write blood pressure results in a log or diary    Why is this important?   You won't feel high blood pressure, but it can still hurt your blood vessels.  High blood pressure can cause heart or kidney problems. It can also cause a stroke.  Making lifestyle changes like losing a little weight or eating less salt will help.  Checking your blood pressure at home and at different times of the day can help to control blood pressure.  If the doctor prescribes medicine remember to take it the way the doctor ordered.  Call the office if you cannot afford the medicine or if there are questions about it.            Consent to CCM Services: Mr. Lanzer was given information about Chronic Care Management services today including:  CCM service includes personalized support from designated clinical staff supervised by his physician, including individualized plan of care and coordination with other care providers 24/7 contact phone numbers for assistance for urgent and routine care needs. Service will only be billed when office clinical staff spend 20 minutes or more in a month to coordinate care. Only one practitioner may furnish and bill the service in a calendar month. The patient may stop CCM services at any time (effective at the end of the month) by phone call to the office staff. The patient will be responsible for cost sharing (co-pay) of up to 20% of the service fee (after annual deductible is met).  Patient agreed to services and verbal consent obtained.    Patient verbalizes understanding of instructions provided today and agrees to view in [place].   Face to Face appointment with care management team member scheduled for: 6 months The patient has been provided with contact information for the care management team and has been advised to call with any health related questions or concerns.   Tomasa Blase, PharmD Clinical Pharmacist, [place]   CLINICAL CARE PLAN: Patient Care Plan: CCM Care Plan     Problem Identified: HTN and HLD      Long-Range Goal: Disease Management   Start Date: 10/02/2020  Expected End Date: 05/05/2021  This Visit's Progress: On track  Priority: High  Note:   Current Barriers:  Unable to independently monitor therapeutic efficacy  Pharmacist Clinical Goal(s):  Patient will achieve adherence to monitoring guidelines and medication adherence to achieve therapeutic efficacy maintain control of BP and LDL as evidenced by blood pressure logs and next lipid panel results  through collaboration with PharmD and provider.   Interventions: 1:1 collaboration with Binnie Rail, MD regarding development and update of comprehensive plan of care as evidenced by provider attestation and co-signature Inter-disciplinary care team collaboration (see longitudinal plan  of care) Comprehensive medication review performed; medication list updated in electronic medical record  Hypertension (BP goal <130/80) -Controlled -Current treatment: Atenolol-Chlorthalidone 50-67m - 1 tablet daily  Doxazosin 868m- 1 tablet daily  Potassium Chloride SA 20 mEq - 2 tablets twice daily  Last Potassium Level: 3.5 mEq/L (04/10/2020) -Medications previously tried: n/a  -Current home readings: has not been checking, reports that he does have a BP cuff at home  -Most recent office blood pressures: 117/79, 140/78, 138/82 -Current dietary habits: does not salt food, eats low sodium diet -Current exercise habits: walks at least 2  miles daily  -Denies hypotensive/hypertensive symptoms -Educated on BP goals and benefits of medications for prevention of heart attack, stroke and kidney damage; Daily salt intake goal < 2300 mg; Exercise goal of 150 minutes per week; Importance of home blood pressure monitoring; Proper BP monitoring technique; Symptoms of hypotension and importance of maintaining adequate hydration; -Counseled to monitor BP at home once wekly, document, and provide log at future appointments -Counseled on diet and exercise extensively Recommended to continue current medication  Hyperlipidemia: (LDL goal < 100) -Controlled -Last LDL Level 10049mL (04/10/2020) -Current treatment: Simvastatin 6m70mily  Aspirin 81mg34mly -Medications previously tried: n/a  -Current dietary patterns: reports that he has been reducing red meat, fried, and fatty food intake -Current exercise habits: walks at least 2 miles daily -Educated on Cholesterol goals;  Benefits of statin for ASCVD risk reduction; Importance of limiting foods high in cholesterol; Exercise goal of 150 minutes per week; -Counseled on diet and exercise extensively Recommended to continue current medication  Health Maintenance -Vaccine gaps: COVID booster -Current therapy:  Omeprazole 6mg 48mcapsule daily if needed - takes 2-3 times weekly  Ibuprofen 200mg -73mablets once daily if needed  - takes 2 times weekly  Diphenhydramine 50mg - 70mblet nightly at bedtime if needed -Educated on Cost vs benefit of each product must be carefully weighed by individual consumer -Patient is satisfied with current therapy and denies issues -Recommended to continue current medication   Patient Goals/Self-Care Activities Patient will:  - take medications as prescribed check blood pressure once weekly, document, and provide at future appointments target a minimum of 150 minutes of moderate intensity exercise weekly engage in dietary modifications by  continue reduction of foods high in cholesterol, as well as sodium reduced diet  Follow Up Plan: Face to Face appointment with care management team member scheduled for:  The patient has been provided with contact information for the care management team and has been advised to call with any health related questions or concerns.

## 2020-10-02 NOTE — Progress Notes (Signed)
Chronic Care Management Pharmacy Note  10/02/2020 Name:  James [name] MRN:  [id number] DOB:  Mar 08, 1951  Summary: - Patient reports that he has been doing well since last visit with PCP, denies any issues or concerns with his current medications  - Notes that he has not had any issues with hypo/hypertension, agreeable to start testing blood pressure at least once weekly, patient to make clinic aware should blood pressure average >130/80  Recommendations/Changes made from today's visit: -no changes to medications at this time, patient to continue with daily exercise, as well as continue reduced sodium diet, patient to continue moderation of foods high in cholesterol as well. -Patient to start to monitor blood pressure once weekly  Subjective: James [name] is an 71 y.o. year old male who is a primary patient of Burns, Claudina Lick, MD.  The CCM team was consulted for assistance with disease management and care coordination needs.    Engaged with patient face to face for initial visit in response to provider referral for pharmacy case management and/or care coordination services.   Consent to Services:  The patient was given the following information about Chronic Care Management services today, agreed to services, and gave verbal consent: 1. CCM service includes personalized support from designated clinical staff supervised by the primary care provider, including individualized plan of care and coordination with other care providers 2. 24/7 contact phone numbers for assistance for urgent and routine care needs. 3. Service will only be billed when office clinical staff spend 20 minutes or more in a month to coordinate care. 4. Only one practitioner may furnish and bill the service in a calendar month. 5.The patient may stop CCM services at any time (effective at the end of the month) by phone call to the office staff. 6. The patient will be responsible for cost sharing (co-pay) of up to 20% of  the service fee (after annual deductible is met). Patient agreed to services and consent obtained.  Patient Care Team: Binnie Rail, MD as PCP - General (Internal Medicine) Zadie Rhine Clent Demark, MD as Consulting Physician (Ophthalmology) Tomasa Blase, [facility] as Pharmacist (Pharmacist)  Recent office visits: 04/21/2020 - no changes to medications, patient stable at this time  04/17/2019 - no changes to medications, patient stable at this time   Recent consult visits: 05/29/2020 - Dr. 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 Katy Fitch - Ophthalmology - partial retinal occulusion of left eye  12/06/2019 - Dr. Zadie Rhine - Ophthalmology - Retina follow up - no new findings, visual acuity is stable, recommend for 2 year follow up   Hospital visits: None in previous 6 months  Objective:  Lab Results  Component Value Date   CREATININE 1.07 04/10/2020   BUN 21 04/10/2020   GFR 71.95 04/10/2020   GFRNONAA >60 12/21/2014   GFRAA >60 12/21/2014   NA 137 04/10/2020   K 3.5 04/10/2020   CALCIUM 9.4 04/10/2020   CO2 30 04/10/2020   GLUCOSE 109 (H) 04/10/2020    Lab Results  Component Value Date/Time   HGBA1C 5.9 04/10/2020 10:30 AM   HGBA1C 6.0 04/12/2019 08:36 AM   GFR 71.95 04/10/2020 10:30 AM   GFR 64.22 04/12/2019 08:36 AM    Last diabetic Eye exam:  Lab Results  Component Value Date/Time   HMDIABEYEEXA No Retinopathy 11/20/2014 12:00 AM  Last diabetic Foot exam:  No results found for: HMDIABFOOTEX   Lab Results  Component Value Date   CHOL 174 04/10/2020   HDL 54.30 04/10/2020   LDLCALC 100 (H) 04/10/2020   TRIG 98.0 04/10/2020   CHOLHDL 3 04/10/2020    Hepatic Function Latest Ref Rng & Units 04/10/2020 04/12/2019 04/06/2018  Total Protein 6.0 - 8.3 g/dL 6.7 6.5 6.7  Albumin 3.5 - 5.2 g/dL 4.3 4.2 4.3  AST 0 - 37 U/L  '16 21 18  ' ALT 0 - 53 U/L '17 20 19  ' Alk Phosphatase 39 - 117 U/L 59 63 64  Total Bilirubin 0.2 - 1.2 mg/dL 0.9 0.7 0.7  Bilirubin, Direct 0.0 - 0.3 mg/dL - - -    Lab Results  Component Value Date/Time   TSH 1.76 04/10/2020 10:30 AM   TSH 1.90 04/12/2019 08:36 AM    CBC Latest Ref Rng & Units 04/10/2020 04/12/2019 04/06/2018  WBC 4.0 - 10.5 K/uL 7.0 5.9 7.1  Hemoglobin 13.0 - 17.0 g/dL 14.9 14.8 15.0  Hematocrit 39.0 - 52.0 % 43.5 43.6 44.0  Platelets 150.0 - 400.0 K/uL 230.0 214.0 259.0    No results found for: VD25OH  Clinical ASCVD: No  The 10-year ASCVD risk score Mikey Bussing DC Jr., et al., 2013) is: 12.9%   Values used to calculate the score:     Age: 89 years     Sex: Male     Is Non-Hispanic African American: No     Diabetic: No     Tobacco smoker: No     Systolic Blood Pressure: 160 mmHg     Is BP treated: Yes     HDL Cholesterol: 54.3 mg/dL     Total Cholesterol: 174 mg/dL    Depression screen [facility] 2/9 04/18/2020 04/17/2019 01/27/2017  Decreased Interest 0 0 0  Down, Depressed, Hopeless 0 0 0  PHQ - 2 Score 0 0 0    Social History   Tobacco Use  Smoking Status Never  Smokeless Tobacco Never   BP Readings from Last 3 Encounters:  05/29/20 117/79  04/21/20 140/78  04/17/19 138/82   Pulse Readings from Last 3 Encounters:  05/29/20 63  04/21/20 80  04/17/19 81   Wt Readings from Last 3 Encounters:  05/29/20 240 lb (108.9 kg)  04/21/20 243 lb (110.2 kg)  04/17/19 251 lb (113.9 kg)   BMI Readings from Last 3 Encounters:  05/29/20 29.21 kg/m  04/21/20 29.58 kg/m  04/17/19 30.55 kg/m    Assessment/Interventions: Review of patient past medical history, allergies, medications, health status, including review of consultants reports, laboratory and other test data, was performed as part of comprehensive evaluation and provision of chronic care management services.   SDOH:  (Social Determinants of Health) assessments and interventions performed: Yes  SDOH  Screenings   Alcohol Screen: Low Risk    Last Alcohol Screening Score (AUDIT): 2  Depression (PHQ2-9): Low Risk    PHQ-2 Score: 0  Financial Resource Strain: Low Risk    Difficulty of Paying Living Expenses: Not hard at all  Food Insecurity: No Food Insecurity   Worried About Charity fundraiser in the Last Year: Never true   Ran Out of Food in the Last Year: Never true  Housing: Low Risk    Last Housing Risk Score: 0  Physical Activity: Sufficiently Active   Days of Exercise per Week: 7 days   Minutes of Exercise per Session: 60 min  Social Connections: Socially Integrated   Frequency  of Communication with Friends and Family: More than three times a week   Frequency of Social Gatherings with Friends and Family: More than three times a week   Attends Religious Services: 1 to 4 times per year   Active Member of [employer] or Organizations: Yes   Attends Archivist Meetings: 1 to 4 times per year   Marital Status: Married  Stress: No Stress Concern Present   Feeling of Stress : Not at all  Tobacco Use: Low Risk    Smoking Tobacco Use: Never   Smokeless Tobacco Use: Never  Transportation Needs: No Transportation Needs   Lack of Transportation (Medical): No   Lack of Transportation (Non-Medical): No    CCM Care Plan  Allergies  Allergen Reactions   Hydrocodone Itching    Medications Reviewed Today     Reviewed by Tomasa Blase, [facility] (Pharmacist) on 10/02/20 at [place] List Status: <None>   Medication Order Taking? Sig Documenting Provider Last Dose Status Informant  aspirin EC 81 MG tablet [id number] Yes Take 81 mg by mouth daily. [provider] Taking Active   atenolol-chlorthalidone (TENORETIC) 50-25 MG tablet [id number] Yes Take 1 tablet by mouth daily. Binnie Rail, MD Taking Active   diphenhydrAMINE HCl, Sleep, (SLEEP AID) 50 MG CAPS [id number] Yes Take 1 capsule by mouth at bedtime. As needed [provider] Taking Active   doxazosin  (CARDURA) 8 MG tablet [id number] Yes Take 1 tablet (8 mg total) by mouth at bedtime. Binnie Rail, MD Taking Active   ibuprofen (ADVIL) 200 MG tablet [id number] Yes Take 600 mg by mouth daily as needed. Takes 2 times a week [provider] Taking Active   omeprazole (PRILOSEC) 40 MG capsule [id number] Yes Take 40 mg by mouth daily as needed. Taking 2-3 times weekly [provider] Taking Active   potassium chloride SA (KLOR-CON M20) 20 MEQ tablet [id number] Yes TAKE TWO TABLETS BY MOUTH TWO TIMES A DAY Burns, Claudina Lick, MD Taking Active   simvastatin (ZOCOR) 40 MG tablet [id number] Yes Take 1 tablet (40 mg total) by mouth at bedtime. 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   Degenerative retinal drusen of right eye 12/06/2019   Posterior vitreous detachment of left eye 12/06/2019   History of retinal detachment 12/06/2019   Dupuytren's contracture of left hand 01/27/2017   Prediabetes 01/27/2017   Hypokalemia 01/27/2016   Lumbar spondylosis 12/20/2014   Hyperlipidemia 04/07/2007   Essential hypertension 04/07/2007   GERD 04/07/2007    Immunization History  Administered Date(s) Administered   Influenza Whole 12/21/2006, 12/20/2008   Influenza, Quadrivalent, Recombinant, Inj, Pf 12/15/2017   Influenza,inj,Quad PF,6+ Mos 01/02/2013, 01/22/2015, 01/13/2016, 01/13/2017, 12/21/2018   Influenza-Unspecified 01/10/2014, 01/09/2016   PFIZER(Purple Top)SARS-COV-2 Vaccination 05/24/2019, 06/19/2019, 01/24/2020   Pneumococcal Conjugate-13 04/12/2018   Pneumococcal Polysaccharide-23 04/17/2019   Td 03/23/2003   Tdap 01/29/2014   Zoster Recombinat (Shingrix) 08/23/2019, 11/29/2019   Zoster, Live 01/02/2013    Conditions to be addressed/monitored:  Hypertension and  Hyperlipidemia  Care Plan : CCM Care Plan  Updates made by Tomasa Blase, [place] since 10/02/2020 12:00 AM     Problem: HTN and HLD      Long-Range Goal: Disease Management  Start Date: 10/02/2020  Expected End Date: 05/05/2021  This Visit's Progress: On track  Priority: High  Note:   Current Barriers:  Unable to independently monitor therapeutic efficacy  Pharmacist Clinical Goal(s):  Patient will achieve adherence to monitoring guidelines and medication adherence to achieve therapeutic efficacy maintain control of BP and LDL as evidenced by blood pressure logs and next lipid panel results  through collaboration with PharmD and provider.   Interventions: 1:1 collaboration with Binnie Rail, MD regarding development and update of comprehensive plan of care as evidenced by provider attestation and co-signature Inter-disciplinary care team collaboration (see longitudinal plan of care) Comprehensive medication review performed; medication list updated in electronic medical record  Hypertension (BP goal <130/80) -Controlled -Current treatment: Atenolol-Chlorthalidone 50-75m - 1 tablet daily  Doxazosin 87m- 1 tablet daily  Potassium Chloride SA 20 mEq - 2 tablets twice daily  Last Potassium Level: 3.5 mEq/L (04/10/2020) -Medications previously tried: n/a  -Current home readings: has not been checking, reports that he does have a BP cuff at home  -Most recent office blood pressures: 117/79, 140/78, 138/82 -Current dietary habits: does not salt food, eats low sodium diet -Current exercise habits: walks at least 2 miles daily  -Denies hypotensive/hypertensive symptoms -Educated on BP goals and benefits of medications for prevention of heart attack, stroke and kidney damage; Daily salt intake goal < 2300 mg; Exercise goal of 150 minutes per week; Importance of home blood pressure monitoring; Proper BP monitoring technique; Symptoms of hypotension and importance of maintaining  adequate hydration; -Counseled to monitor BP at home once wekly, document, and provide log at future appointments -Counseled on diet and exercise extensively Recommended to continue current medication  Hyperlipidemia: (LDL goal < 100) -Controlled -Last LDL Level 10042mL (04/10/2020) -Current treatment: Simvastatin 28m47mily  Aspirin 81mg87mly -Medications previously tried: n/a  -Current dietary patterns: reports that he has been reducing red meat, fried, and fatty food intake -Current exercise habits: walks at least 2 miles daily -Educated on Cholesterol goals;  Benefits of statin for ASCVD risk reduction; Importance of limiting foods high in cholesterol; Exercise goal of 150 minutes per week; -Counseled on diet and exercise extensively Recommended to continue current medication  Health Maintenance -Vaccine gaps: COVID booster -Current therapy:  Omeprazole 28mg 47mcapsule daily if needed - takes 2-3 times weekly  Ibuprofen 200mg -28mablets once daily if needed  - takes 2 times weekly  Diphenhydramine 50mg - 80mblet nightly at bedtime if needed -Educated on Cost vs benefit of each product must be carefully weighed by individual consumer -Patient is satisfied with current therapy and denies issues -Recommended to continue current medication   Patient Goals/Self-Care Activities Patient will:  - take medications as prescribed check blood pressure once weekly, document, and provide at future appointments target a minimum of 150 minutes of moderate intensity exercise weekly engage in dietary modifications by continue reduction of foods high in cholesterol, as well as sodium reduced diet  Follow Up Plan: Face to Face appointment with care management team member scheduled for:  The patient has been provided with contact information for the care management team and has been advised to call with any health related questions or concerns.        Medication Assistance: None  required.  Patient affirms current coverage meets needs.  Patient's preferred pharmacy is:  [facility] [phone] [address] [phone]  Fax: [phone]  Uses pill box? Yes Pt endorses 100% compliance  Care Plan and Follow Up Patient Decision:  Patient agrees to Care Plan and Follow-up.  Plan: Face to Face appointment with care management team member scheduled for: 6 months and The patient has been provided with contact information for the care management team and has been advised to call with any health related questions or concerns.   Tomasa Blase, PharmD Clinical Pharmacist, [place]

## 2020-11-11 DIAGNOSIS — L989 Disorder of the skin and subcutaneous tissue, unspecified: Secondary | ICD-10-CM | POA: Diagnosis not present

## 2020-11-11 DIAGNOSIS — L905 Scar conditions and fibrosis of skin: Secondary | ICD-10-CM | POA: Diagnosis not present

## 2020-11-11 DIAGNOSIS — D0339 Melanoma in situ of other parts of face: Secondary | ICD-10-CM | POA: Diagnosis not present

## 2020-11-21 ENCOUNTER — Telehealth: Payer: Self-pay

## 2020-11-21 ENCOUNTER — Telehealth: Payer: Medicare Other | Admitting: Physician Assistant

## 2020-11-21 DIAGNOSIS — U071 COVID-19: Secondary | ICD-10-CM

## 2020-11-21 MED ORDER — ALBUTEROL SULFATE HFA 108 (90 BASE) MCG/ACT IN AERS: 2.0000 | INHALATION_SPRAY | RESPIRATORY_TRACT | 0 refills | Status: AC | PRN

## 2020-11-21 MED ORDER — BENZONATATE 100 MG PO CAPS: 100.0000 mg | ORAL_CAPSULE | Freq: Three times a day (TID) | ORAL | 0 refills | Status: DC | PRN

## 2020-11-21 MED ORDER — AEROCHAMBER PLUS FLO-VU MEDIUM MISC: 1.0000 | Freq: Once | 0 refills | Status: AC

## 2020-11-21 MED ORDER — MOLNUPIRAVIR EUA 200MG CAPSULE: 4.0000 | ORAL_CAPSULE | Freq: Two times a day (BID) | ORAL | 0 refills | Status: AC

## 2020-11-21 MED ORDER — ONDANSETRON 4 MG PO TBDP: ORAL_TABLET | ORAL | 0 refills | Status: DC

## 2020-11-21 MED ORDER — FLUTICASONE PROPIONATE 50 MCG/ACT NA SUSP: 2.0000 | Freq: Every day | NASAL | 0 refills | Status: DC

## 2020-11-21 NOTE — Progress Notes (Signed)
James [name], James [name] scheduled for a virtual visit with your provider today.    Just as we do with appointments in the office, we must obtain your consent to participate.  Your consent will be active for this visit and any virtual visit you may have with one of our providers in the next 365 days.    If you have a MyChart account, I can also send a copy of this consent to you electronically.  All virtual visits [name] billed to your insurance company just like a traditional visit in the office.  As this is a virtual visit, video technology does not allow for your provider to perform a traditional examination.  This may limit your provider's ability to fully assess your condition.  If your provider identifies any concerns that need to be evaluated in person or the need to arrange testing such as labs, EKG, etc, we will make arrangements to do so.    Although advances in technology [name] sophisticated, we cannot ensure that it will always work on either your end or our end.  If the connection with a video visit is poor, we may have to switch to a telephone visit.  With either a video or telephone visit, we [name] not always able to ensure that we have a secure connection.   I need to obtain your verbal consent now.   [name] you willing to proceed with your visit today?   James [name] has provided verbal consent on 11/21/2020 for a virtual visit (video or telephone).   James Butts, PA-C 11/21/2020  5:16 PM   Date:  11/21/2020   ID:  James [name], DOB 03-07-1951, MRN [id number]  Patient Location: Home Provider Location: Home Office   Participants: Patient and Provider for Visit and Wrap up  Method of visit: Video  Location of Patient: Home Location of Provider: Home Office Consent was obtain for visit over the video. Services rendered by provider: Visit was performed via video  A video enabled telemedicine application was used and I verified that I am speaking with the correct person using two  identifiers.  PCP:  James Rail, MD   Chief Complaint:  COVID-19  History of Present Illness:    James Isaak. is a 70 y.o. male with history as stated below. Presents video telehealth for an acute care visit  Onset of symptoms was Wednesday and symptoms have been persistent and include: nasal congestion, headache, cough, sore throat, diarrhea, chest tightness, vomiting, diarrhea, fever (low grade). Pt reports he tested positive yesterday and has been attempting a video visit with his PCP, but was unable to connect.  Pt reports he feel some dehydrated. He has been drinking water but has been vomiting this approx 50% of the time. Pt is taking tylenol without relief. Does not have a SPO2 monitor. Does endorse some wheezing, but is not having dyspnea on exertion.   Hx of melanoma (stage 1 on forehead); HTN, HLD, pre-diabetes.  Creatinine in Jan >1.0. No bloodwork since that time.  Denies CP, abd pain, numbness, weakness, syncope/near syncope.  No other aggravating or relieving factors.  No other c/o.  Past Medical, Surgical, Social History, Allergies, and Medications have been Reviewed.  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   Degenerative retinal drusen of right eye 12/06/2019   Posterior vitreous  detachment of left eye 12/06/2019   History of retinal detachment 12/06/2019   Dupuytren's contracture of left hand 01/27/2017   Prediabetes 01/27/2017   Hypokalemia 01/27/2016   Lumbar spondylosis 12/20/2014   Hyperlipidemia 04/07/2007   Essential hypertension 04/07/2007   GERD 04/07/2007    Social History   Tobacco Use   Smoking status: Never   Smokeless tobacco: Never  Substance Use Topics   Alcohol use: Yes    Comment: 1 bottle wine a week, on weekends      Current Outpatient Medications:    albuterol  (VENTOLIN HFA) 108 (90 Base) MCG/ACT inhaler, Inhale 2 puffs into the lungs every 2 (two) hours as needed for wheezing or shortness of breath (cough)., Disp: 8 g, Rfl: 0   benzonatate (TESSALON PERLES) 100 MG capsule, Take 1 capsule (100 mg total) by mouth 3 (three) times daily as needed for cough (cough)., Disp: 20 capsule, Rfl: 0   fluticasone (FLONASE) 50 MCG/ACT nasal spray, Place 2 sprays into both nostrils daily., Disp: 9.9 g, Rfl: 0   molnupiravir EUA 200 mg CAPS, Take 4 capsules (800 mg total) by mouth 2 (two) times daily for 5 days., Disp: 40 capsule, Rfl: 0   ondansetron (ZOFRAN ODT) 4 MG disintegrating tablet, '4mg'$  ODT q4 hours prn nausea/vomit, Disp: 10 tablet, Rfl: 0   Spacer/Aero-Holding Chambers (AEROCHAMBER PLUS FLO-VU MEDIUM) MISC, 1 each by Other route once for 1 dose., Disp: 1 each, Rfl: 0   aspirin EC 81 MG tablet, Take 81 mg by mouth daily., Disp: , Rfl:    atenolol-chlorthalidone (TENORETIC) 50-25 MG tablet, Take 1 tablet by mouth daily., Disp: 90 tablet, Rfl: 3   diphenhydrAMINE HCl, Sleep, (SLEEP AID) 50 MG CAPS, Take 1 capsule by mouth at bedtime. As needed, Disp: , Rfl:    doxazosin (CARDURA) 8 MG tablet, Take 1 tablet (8 mg total) by mouth at bedtime., Disp: 90 tablet, Rfl: 3   ibuprofen (ADVIL) 200 MG tablet, Take 600 mg by mouth daily as needed. Takes 2 times a week, Disp: , Rfl:    omeprazole (PRILOSEC) 40 MG capsule, Take 40 mg by mouth daily as needed. Taking 2-3 times weekly, Disp: , Rfl:    potassium chloride SA (KLOR-CON M20) 20 MEQ tablet, TAKE TWO TABLETS BY MOUTH TWO TIMES A DAY, Disp: 360 tablet, Rfl: 3   simvastatin (ZOCOR) 40 MG tablet, Take 1 tablet (40 mg total) by mouth at bedtime., Disp: 90 tablet, Rfl: 3   Allergies  Allergen Reactions   Hydrocodone Itching     Review of Systems  Constitutional:  Positive for chills, fever and malaise/fatigue.  HENT:  Positive for congestion and sore throat. Negative for ear pain.   Eyes:  Negative for blurred  vision and double vision.  Respiratory:  Positive for shortness of breath (mild) and wheezing. Negative for cough.   Cardiovascular:  Negative for chest pain, palpitations and leg swelling.  Gastrointestinal:  Positive for diarrhea, nausea and vomiting. Negative for abdominal pain.  Genitourinary:  Negative for dysuria.  Musculoskeletal:  Positive for myalgias.  Skin:  Negative for rash.  Neurological:  Positive for headaches. Negative for loss of consciousness and weakness.  Psychiatric/Behavioral:  The patient is not nervous/anxious.   See HPI for history of present illness.  Physical Exam Constitutional:      General: He is not in acute distress.    Appearance: Normal appearance. He is well-developed. He is not ill-appearing.  HENT:     Head: Normocephalic and atraumatic.  Nose: Nose normal.  Eyes:     General: No scleral icterus.    Conjunctiva/sclera: Conjunctivae normal.  Pulmonary:     Effort: Pulmonary effort is normal.     Comments: Speaks in full sentences No accessory muscle usage Musculoskeletal:        General: Normal range of motion.     Cervical back: Normal range of motion.  Skin:    Coloration: Skin is not pale.  Neurological:     General: No focal deficit present.     Mental Status: He is alert.  Psychiatric:        Mood and Affect: Mood normal.              A&P  1. COVID-19  - Molnupiravir - antiviral 2x per day - start tonight  - zofran -  as needed for vomiting and nausea  - albuterol - as needed every 2 hours for wheezing and shortness of breath  - flonase and OTC Mucinex - nasal congestion  - Tylenol/ibuprofen - fever control  - tessalon - as needed for cough  - follow-up with Emergency department for worsening shortness of breath, dehydration, weakness, syncope or other concerns   Patient voiced understanding and agreement to plan.   Time:   Today, I have spent 15 minutes with the patient with telehealth technology discussing the above  problems, reviewing the chart, previous notes, medications and orders.    Tests Ordered: No orders of the defined types were placed in this encounter.   Medication Changes: Meds ordered this encounter  Medications   molnupiravir EUA 200 mg CAPS    Sig: Take 4 capsules (800 mg total) by mouth 2 (two) times daily for 5 days.    Dispense:  40 capsule    Refill:  0    Order Specific Question:   Supervising Provider    Answer:   MILLER, BRIAN [[id number]]   albuterol (VENTOLIN HFA) 108 (90 Base) MCG/ACT inhaler    Sig: Inhale 2 puffs into the lungs every 2 (two) hours as needed for wheezing or shortness of breath (cough).    Dispense:  8 g    Refill:  0    Order Specific Question:   Supervising Provider    Answer:   Sabra Heck, BRIAN [[id number]]   Spacer/Aero-Holding Chambers (AEROCHAMBER PLUS FLO-VU MEDIUM) MISC    Sig: 1 each by Other route once for 1 dose.    Dispense:  1 each    Refill:  0    Order Specific Question:   Supervising Provider    Answer:   Sabra Heck, BRIAN [[id number]]   benzonatate (TESSALON PERLES) 100 MG capsule    Sig: Take 1 capsule (100 mg total) by mouth 3 (three) times daily as needed for cough (cough).    Dispense:  20 capsule    Refill:  0    Order Specific Question:   Supervising Provider    Answer:   MILLER, BRIAN [[id number]]   ondansetron (ZOFRAN ODT) 4 MG disintegrating tablet    Sig: '4mg'$  ODT q4 hours prn nausea/vomit    Dispense:  10 tablet    Refill:  0    Order Specific Question:   Supervising Provider    Answer:   MILLER, BRIAN [[id number]]   fluticasone (FLONASE) 50 MCG/ACT nasal spray    Sig: Place 2 sprays into both nostrils daily.    Dispense:  9.9 g    Refill:  0    Order Specific Question:   Supervising Provider  Answer:   Noemi Chapel [[id number]]     Disposition:  Follow up ED if symptoms worsen.  Celso Sickle, PA-C  11/21/2020 5:16 PM

## 2020-11-21 NOTE — Telephone Encounter (Signed)
Please advise as the pt has stated his wife spoke with [facility] this morning and was told he would be contacted to day at 3pm by ohne to discuss his pos covid results a/w getting medications to help with his sxs of bodyaches,  nasal & chest congestion, cough and fever.  **Pt realized when it was time to confirm his appointment via [facility] the apptmnt is for next week Tuesday at 3pm. Pt is in need of medication asap.  Please give pt a call with further instructions [phone].

## 2020-11-21 NOTE — Patient Instructions (Signed)
1. COVID-19  - Molnupiravir - antiviral 2x per day - start tonight  - zofran -  as needed for vomiting and nausea  - albuterol - as needed every 2 hours for wheezing and shortness of breath  - flonase and OTC Mucinex - nasal congestion  - Tylenol/ibuprofen - fever control  - tessalon - as needed for cough  - follow-up with Emergency department for worsening shortness of breath, dehydration, weakness, syncope or other concerns

## 2020-11-21 NOTE — Telephone Encounter (Signed)
Spoke with patient today and gave him options for virtual visit through [facility].

## 2020-11-25 ENCOUNTER — Telehealth: Payer: Medicare Other | Admitting: Internal Medicine

## 2020-12-16 ENCOUNTER — Other Ambulatory Visit: Payer: Self-pay | Admitting: Dermatology

## 2020-12-16 DIAGNOSIS — Z23 Encounter for immunization: Secondary | ICD-10-CM | POA: Diagnosis not present

## 2020-12-16 DIAGNOSIS — Z86006 Personal history of melanoma in-situ: Secondary | ICD-10-CM | POA: Diagnosis not present

## 2020-12-16 DIAGNOSIS — L578 Other skin changes due to chronic exposure to nonionizing radiation: Secondary | ICD-10-CM | POA: Diagnosis not present

## 2020-12-16 DIAGNOSIS — D225 Melanocytic nevi of trunk: Secondary | ICD-10-CM | POA: Diagnosis not present

## 2020-12-16 DIAGNOSIS — D485 Neoplasm of uncertain behavior of skin: Secondary | ICD-10-CM | POA: Diagnosis not present

## 2020-12-16 DIAGNOSIS — Z85820 Personal history of malignant melanoma of skin: Secondary | ICD-10-CM | POA: Diagnosis not present

## 2020-12-16 DIAGNOSIS — L814 Other melanin hyperpigmentation: Secondary | ICD-10-CM | POA: Diagnosis not present

## 2020-12-16 DIAGNOSIS — D2262 Melanocytic nevi of left upper limb, including shoulder: Secondary | ICD-10-CM | POA: Diagnosis not present

## 2020-12-16 DIAGNOSIS — L821 Other seborrheic keratosis: Secondary | ICD-10-CM | POA: Diagnosis not present

## 2020-12-16 DIAGNOSIS — D2272 Melanocytic nevi of left lower limb, including hip: Secondary | ICD-10-CM | POA: Diagnosis not present

## 2020-12-17 LAB — DERMATOPATHOLOGY REPORT

## 2020-12-17 LAB — DERMPATH, SPECIMEN A

## 2020-12-19 ENCOUNTER — Other Ambulatory Visit: Payer: Self-pay | Admitting: *Deleted

## 2020-12-19 DIAGNOSIS — I6522 Occlusion and stenosis of left carotid artery: Secondary | ICD-10-CM

## 2020-12-25 DIAGNOSIS — Z1152 Encounter for screening for COVID-19: Secondary | ICD-10-CM | POA: Diagnosis not present

## 2020-12-31 ENCOUNTER — Ambulatory Visit (INDEPENDENT_AMBULATORY_CARE_PROVIDER_SITE_OTHER): Payer: Medicare Other | Admitting: Physician Assistant

## 2020-12-31 ENCOUNTER — Ambulatory Visit (HOSPITAL_COMMUNITY)
Admission: RE | Admit: 2020-12-31 | Discharge: 2020-12-31 | Disposition: A | Discharge status: Home / Self Care | Payer: Medicare Other | Source: Ambulatory Visit | Attending: Vascular Surgery | Admitting: Vascular Surgery

## 2020-12-31 ENCOUNTER — Other Ambulatory Visit: Payer: Self-pay

## 2020-12-31 VITALS — BP 130/84 | HR 57 | Temp 98.0°F | Resp 14 | Ht 76.0 in | Wt 236.6 lb

## 2020-12-31 DIAGNOSIS — I6522 Occlusion and stenosis of left carotid artery: Secondary | ICD-10-CM | POA: Insufficient documentation

## 2020-12-31 DIAGNOSIS — I6523 Occlusion and stenosis of bilateral carotid arteries: Secondary | ICD-10-CM | POA: Diagnosis not present

## 2020-12-31 NOTE — Progress Notes (Signed)
Carotid Artery Follow-Up   VASCULAR SURGERY ASSESSMENT & PLAN:   James Rueb. is a 70 y.o. male evaluated by Dr. Oneida Alar on May 29, 2020.  He had asymptomatic mild to moderate left ICA stenosis and returns for routine follow-up.  Mild to moderate bilateral carotid artery stenosis: The patient has no symptoms referable to carotid artery stenosis.  Duplex examination today is stable as compared to 6 months ago.  We reviewed the signs and symptoms of stroke/TIA and advised the patient to call EMS should these occur.   Continue optimal medical management of hypertension and follow-up with primary care physician. Non-smoker. Continue the following medications: Aspirin and statin Follow-up in 1 year with carotid duplex ultrasound.  SUBJECTIVE:   The patient denies monocular blindness, slurred speech, facial drooping, extremity weakness or numbness.   PHYSICAL EXAM:   Vitals:   12/31/20 1120  BP: 130/84  Pulse: (!) 57  Resp: 14  Temp: 98 F (36.7 C)  SpO2: 96%     General appearance: Well-developed, well-nourished in no apparent distress Neurologic: Alert and oriented x4, tongue is midline, face symmetric, speech fluent, 5 out of 5 bilateral upper extremity grip strength, triceps and biceps strength Cardiovascular: Heart rate and rhythm are regular.  Pedal pulses are palpable.  No carotid bruits. Respirations: Nonlabored Abdomen: No palpable pulsatile mass   NON-INVASIVE VASCULAR STUDIES   12/31/2020 Summary:  Right Carotid: Velocities in the right ICA are consistent with a 1-39%  stenosis.   Left Carotid: Velocities in the left ICA are consistent with a 40-59%  stenosis.   Vertebrals:  Bilateral vertebral arteries demonstrate antegrade flow.  Subclavians: Normal flow hemodynamics were seen in bilateral subclavian               arteries.   *See table(s) above for measurements and observations.       Preliminary   PROBLEM LIST:    The patient's past medical  history, past surgical history, family history, social history, allergy list and medication list are reviewed.  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 He was started on aspirin therapy.  CURRENT MEDS:    Current Outpatient Medications:    albuterol (VENTOLIN HFA) 108 (90 Base) MCG/ACT inhaler, Inhale 2 puffs into the lungs every 2 (two) hours as needed for wheezing or shortness of breath (cough)., Disp: 8 g, Rfl: 0   aspirin EC 81 MG tablet, Take 81 mg by mouth daily., Disp: , Rfl:    atenolol-chlorthalidone (TENORETIC) 50-25 MG tablet, Take 1 tablet by mouth daily., Disp: 90 tablet, Rfl: 3   benzonatate (TESSALON PERLES) 100 MG capsule, Take 1 capsule (100 mg total) by mouth 3 (three) times daily as needed for cough (cough)., Disp: 20 capsule, Rfl: 0   diphenhydrAMINE HCl, Sleep, (SLEEP AID) 50 MG CAPS, Take 1 capsule by mouth at bedtime. As needed, Disp: , Rfl:    doxazosin (CARDURA) 8 MG tablet, Take 1 tablet (8 mg total) by mouth at bedtime., Disp: 90 tablet, Rfl: 3   fluticasone (FLONASE) 50 MCG/ACT nasal spray, Place 2 sprays into both nostrils daily., Disp: 9.9 g, Rfl: 0   ibuprofen (ADVIL) 200 MG tablet, Take 600 mg by mouth daily as needed. Takes 2 times a week, Disp: , Rfl:    omeprazole (PRILOSEC) 40 MG capsule, Take 40 mg by mouth daily as needed. Taking 2-3 times weekly, Disp: , Rfl:  ondansetron (ZOFRAN ODT) 4 MG disintegrating tablet, 4mg  ODT q4 hours prn nausea/vomit, Disp: 10 tablet, Rfl: 0   potassium chloride SA (KLOR-CON M20) 20 MEQ tablet, TAKE TWO TABLETS BY MOUTH TWO TIMES A DAY, Disp: 360 tablet, Rfl: 3   simvastatin (ZOCOR) 40 MG tablet, Take 1 tablet (40 mg total) by mouth at bedtime., Disp: 90 tablet, Rfl: 3   REVIEW OF SYSTEMS:   [X]  denotes positive finding, [ ]  denotes negative finding Cardiac  Comments:  Chest pain or chest pressure:    Shortness of  breath upon exertion:    Short of breath when lying flat:    Irregular heart rhythm:        Vascular    Pain in calf, thigh, or hip brought on by ambulation:    Pain in feet at night that wakes you up from your sleep:     Blood clot in your veins:    Leg swelling:         Pulmonary    Oxygen at home:    Productive cough:     Wheezing:         Neurologic    Sudden weakness in arms or legs:     Sudden numbness in arms or legs:     Sudden onset of difficulty speaking or slurred speech:    Temporary loss of vision in one eye:     Problems with dizziness:         Gastrointestinal    Blood in stool:     Vomited blood:         Genitourinary    Burning when urinating:     Blood in urine:        Psychiatric    Major depression:         Hematologic    Bleeding problems:    Problems with blood clotting too easily:        Skin    Rashes or ulcers:        Constitutional    Fever or chills:     Barbie Banner, PA-C  Office: [phone] 12/31/2020 Dr. Donzetta Matters

## 2021-01-22 DIAGNOSIS — Z23 Encounter for immunization: Secondary | ICD-10-CM | POA: Diagnosis not present

## 2021-01-26 ENCOUNTER — Telehealth: Payer: Self-pay

## 2021-01-26 NOTE — Progress Notes (Signed)
Chronic Care Management Pharmacy Assistant   Name: James [name]  MRN: [id number] DOB: 1950-07-15  Reason for Encounter: Disease State - Hypertension   Recent office visits:  None listed  Recent consult visits:  12/31/20 Vaughan Basta, PA (Vascular & Vein) - Asymptomatic bilateral carotid artery stenosis. No med changes.  11/21/20 Muthersbaugh,PA (Video  Visit) - Covid-19. Start Albuterol Sulfate 108, Benzonatate 100 mg, Fluticasone Propionate 50 mcg, Molnupiravir 800 mg & Ondansetron 4 mg.  Hospital visits:  None in previous 6 months  Medications: Outpatient Encounter Medications as of 01/26/2021  Medication Sig   albuterol (VENTOLIN HFA) 108 (90 Base) MCG/ACT inhaler Inhale 2 puffs into the lungs every 2 (two) hours as needed for wheezing or shortness of breath (cough).   aspirin EC 81 MG tablet Take 81 mg by mouth daily.   atenolol-chlorthalidone (TENORETIC) 50-25 MG tablet Take 1 tablet by mouth daily.   benzonatate (TESSALON PERLES) 100 MG capsule Take 1 capsule (100 mg total) by mouth 3 (three) times daily as needed for cough (cough).   diphenhydrAMINE HCl, Sleep, (SLEEP AID) 50 MG CAPS Take 1 capsule by mouth at bedtime. As needed   doxazosin (CARDURA) 8 MG tablet Take 1 tablet (8 mg total) by mouth at bedtime.   fluticasone (FLONASE) 50 MCG/ACT nasal spray Place 2 sprays into both nostrils daily.   ibuprofen (ADVIL) 200 MG tablet Take 600 mg by mouth daily as needed. Takes 2 times a week   omeprazole (PRILOSEC) 40 MG capsule Take 40 mg by mouth daily as needed. Taking 2-3 times weekly   ondansetron (ZOFRAN ODT) 4 MG disintegrating tablet 4mg  ODT q4 hours prn nausea/vomit   potassium chloride SA (KLOR-CON M20) 20 MEQ tablet TAKE TWO TABLETS BY MOUTH TWO TIMES A DAY   simvastatin (ZOCOR) 40 MG tablet Take 1 tablet (40 mg total) by mouth at bedtime.   No facility-administered encounter medications on file as of 01/26/2021.    Reviewed chart prior to disease state call. Spoke  with patient regarding BP  Recent Office Vitals: BP Readings from Last 3 Encounters:  12/31/20 130/84  05/29/20 117/79  04/21/20 140/78   Pulse Readings from Last 3 Encounters:  12/31/20 (!) 57  05/29/20 63  04/21/20 80    Wt Readings from Last 3 Encounters:  12/31/20 236 lb 9.6 oz (107.3 kg)  05/29/20 240 lb (108.9 kg)  04/21/20 243 lb (110.2 kg)     Kidney Function Lab Results  Component Value Date/Time   CREATININE 1.07 04/10/2020 10:30 AM   CREATININE 1.14 04/12/2019 08:36 AM   GFR 71.95 04/10/2020 10:30 AM   GFRNONAA >60 12/21/2014 05:13 AM   GFRAA >60 12/21/2014 05:13 AM    BMP Latest Ref Rng & Units 04/10/2020 04/12/2019 04/06/2018  Glucose 70 - 99 mg/dL 109(H) 119(H) 108(H)  BUN 6 - 23 mg/dL 21 16 19   Creatinine 0.40 - 1.50 mg/dL 1.07 1.14 1.14  Sodium 135 - 145 mEq/L 137 138 141  Potassium 3.5 - 5.1 mEq/L 3.5 3.2(L) 3.8  Chloride 96 - 112 mEq/L 98 99 101  CO2 19 - 32 mEq/L 30 32 31  Calcium 8.4 - 10.5 mg/dL 9.4 9.0 9.7    Current antihypertensive regimen:  Atenolol-Chlorthalidone 50-25mg  - 1 tablet daily  Doxazosin 8mg  - 1 tablet daily  Potassium Chloride SA 20 mEq - 2 tablets twice daily   How often are you checking your Blood Pressure?  1-2x per week  Current home BP readings: Patient states his last reading  was 130/85.  What recent interventions/DTPs have been made by any provider to improve Blood Pressure control since last CPP Visit:  None listed  Any recent hospitalizations or ED visits since last visit with CPP?  No  What diet changes have been made to improve Blood Pressure Control?  Patient states no recent diet changes.  What exercise is being done to improve your Blood Pressure Control?  Patient states he walks 2 miles a day.  Adherence Review: Is the patient currently on ACE/ARB medication? No Does the patient have >5 day gap between last estimated fill dates? No   Care Gaps: Colonoscopy - last May 2013 Diabetic Foot Exam -  NA Mammogram - NA Ophthalmology - NA Dexa Scan - NA Annual Well Visit - NA Micro albumin - NA Hemoglobin A1c - 5.9 next appt. 04/23/21  Star Rating Drugs: Simvastatin - filled 11/11/20 [place], [place] Clinical Pharmacists Assistant [phone]

## 2021-01-29 DIAGNOSIS — Z1152 Encounter for screening for COVID-19: Secondary | ICD-10-CM | POA: Diagnosis not present

## 2021-02-05 DIAGNOSIS — L905 Scar conditions and fibrosis of skin: Secondary | ICD-10-CM | POA: Diagnosis not present

## 2021-02-05 DIAGNOSIS — D485 Neoplasm of uncertain behavior of skin: Secondary | ICD-10-CM | POA: Diagnosis not present

## 2021-04-01 ENCOUNTER — Other Ambulatory Visit: Payer: Self-pay | Admitting: Internal Medicine

## 2021-04-01 DIAGNOSIS — E785 Hyperlipidemia, unspecified: Secondary | ICD-10-CM

## 2021-04-08 ENCOUNTER — Telehealth: Payer: Self-pay

## 2021-04-08 DIAGNOSIS — E782 Mixed hyperlipidemia: Secondary | ICD-10-CM

## 2021-04-08 DIAGNOSIS — Z125 Encounter for screening for malignant neoplasm of prostate: Secondary | ICD-10-CM

## 2021-04-08 DIAGNOSIS — I10 Essential (primary) hypertension: Secondary | ICD-10-CM

## 2021-04-08 DIAGNOSIS — E876 Hypokalemia: Secondary | ICD-10-CM

## 2021-04-08 DIAGNOSIS — R7303 Prediabetes: Secondary | ICD-10-CM

## 2021-04-08 NOTE — Telephone Encounter (Signed)
Pt requesting to have labs drawn a few days before the Physical scheduled for 04/23/21.  CB [phone]

## 2021-04-08 NOTE — Telephone Encounter (Signed)
Labs ordered.   Let him know this will actually be a f/u visit since he has medicare they do not cover CPE's   Please change visit info on schedule

## 2021-04-09 NOTE — Telephone Encounter (Signed)
Appointment scheduled.

## 2021-04-17 ENCOUNTER — Other Ambulatory Visit (INDEPENDENT_AMBULATORY_CARE_PROVIDER_SITE_OTHER): Payer: Medicare Other

## 2021-04-17 ENCOUNTER — Other Ambulatory Visit: Payer: Self-pay

## 2021-04-17 DIAGNOSIS — I10 Essential (primary) hypertension: Secondary | ICD-10-CM

## 2021-04-17 DIAGNOSIS — Z125 Encounter for screening for malignant neoplasm of prostate: Secondary | ICD-10-CM

## 2021-04-17 DIAGNOSIS — R7303 Prediabetes: Secondary | ICD-10-CM | POA: Diagnosis not present

## 2021-04-17 DIAGNOSIS — E876 Hypokalemia: Secondary | ICD-10-CM | POA: Diagnosis not present

## 2021-04-17 DIAGNOSIS — E782 Mixed hyperlipidemia: Secondary | ICD-10-CM

## 2021-04-17 LAB — CBC WITH DIFFERENTIAL/PLATELET
Basophils Absolute: 0 10*3/uL (ref 0.0–0.1)
Basophils Relative: 0.7 % (ref 0.0–3.0)
Eosinophils Absolute: 0.5 10*3/uL (ref 0.0–0.7)
Eosinophils Relative: 6.8 % — ABNORMAL HIGH (ref 0.0–5.0)
HCT: 43.3 % (ref 39.0–52.0)
Hemoglobin: 14.9 g/dL (ref 13.0–17.0)
Lymphocytes Relative: 20.1 % (ref 12.0–46.0)
Lymphs Abs: 1.4 10*3/uL (ref 0.7–4.0)
MCHC: 34.5 g/dL (ref 30.0–36.0)
MCV: 87.2 fl (ref 78.0–100.0)
Monocytes Absolute: 0.5 10*3/uL (ref 0.1–1.0)
Monocytes Relative: 7.2 % (ref 3.0–12.0)
Neutro Abs: 4.5 10*3/uL (ref 1.4–7.7)
Neutrophils Relative %: 65.2 % (ref 43.0–77.0)
Platelets: 228 10*3/uL (ref 150.0–400.0)
RBC: 4.97 Mil/uL (ref 4.22–5.81)
RDW: 13.3 % (ref 11.5–15.5)
WBC: 6.8 10*3/uL (ref 4.0–10.5)

## 2021-04-17 LAB — LIPID PANEL
Cholesterol: 171 mg/dL (ref 0–200)
HDL: 45.9 mg/dL (ref >39.00)
LDL Cholesterol: 105 mg/dL — ABNORMAL HIGH (ref 0–99)
NonHDL: 124.97
Total CHOL/HDL Ratio: 4
Triglycerides: 101 mg/dL (ref 0.0–149.0)
VLDL: 20.2 mg/dL (ref 0.0–40.0)

## 2021-04-17 LAB — COMPREHENSIVE METABOLIC PANEL
ALT: 16 U/L (ref 0–53)
AST: 16 U/L (ref 0–37)
Albumin: 4.3 g/dL (ref 3.5–5.2)
Alkaline Phosphatase: 53 U/L (ref 39–117)
BUN: 25 mg/dL — ABNORMAL HIGH (ref 6–23)
CO2: 32 mEq/L (ref 19–32)
Calcium: 9.4 mg/dL (ref 8.4–10.5)
Chloride: 100 mEq/L (ref 96–112)
Creatinine, Ser: 1.11 mg/dL (ref 0.40–1.50)
GFR: 68.36 mL/min (ref >60.00)
Glucose, Bld: 107 mg/dL — ABNORMAL HIGH (ref 70–99)
Potassium: 3.5 mEq/L (ref 3.5–5.1)
Sodium: 140 mEq/L (ref 135–145)
Total Bilirubin: 0.6 mg/dL (ref 0.2–1.2)
Total Protein: 6.6 g/dL (ref 6.0–8.3)

## 2021-04-17 LAB — PSA, MEDICARE: PSA: 2.28 ng/ml (ref 0.10–4.00)

## 2021-04-17 LAB — HEMOGLOBIN A1C: Hgb A1c MFr Bld: 6 % (ref 4.6–6.5)

## 2021-04-20 ENCOUNTER — Ambulatory Visit: Payer: Medicare Other

## 2021-04-22 ENCOUNTER — Encounter: Payer: Self-pay | Admitting: Internal Medicine

## 2021-04-22 DIAGNOSIS — Z86006 Personal history of melanoma in-situ: Secondary | ICD-10-CM | POA: Insufficient documentation

## 2021-04-22 NOTE — Patient Instructions (Addendum)
Blood work was ordered.  Have this done in about 6 weeks - it is fasting.     Medications changes include :   stop simvastatin and start crestor 40 mg daily  Your prescription(s) have been submitted to your pharmacy. Please take as directed and contact our office if you believe you are having problem(s) with the medication(s).    Please followup in 1 year    Health Maintenance, Male Adopting a healthy lifestyle and getting preventive care are important in promoting health and wellness. Ask your health care provider about: The right schedule for you to have regular tests and exams. Things you can do on your own to prevent diseases and keep yourself healthy. What should I know about diet, weight, and exercise? Eat a healthy diet  Eat a diet that includes plenty of vegetables, fruits, low-fat dairy products, and lean protein. Do not eat a lot of foods that are high in solid fats, added sugars, or sodium. Maintain a healthy weight Body mass index (BMI) is a measurement that can be used to identify possible weight problems. It estimates body fat based on height and weight. Your health care provider can help determine your BMI and help you achieve or maintain a healthy weight. Get regular exercise Get regular exercise. This is one of the most important things you can do for your health. Most adults should: Exercise for at least 150 minutes each week. The exercise should increase your heart rate and make you sweat (moderate-intensity exercise). Do strengthening exercises at least twice a week. This is in addition to the moderate-intensity exercise. Spend less time sitting. Even light physical activity can be beneficial. Watch cholesterol and blood lipids Have your blood tested for lipids and cholesterol at 69 years of age, then have this test every 5 years. You may need to have your cholesterol levels checked more often if: Your lipid or cholesterol levels are high. You are older than  69 years of age. You are at high risk for heart disease. What should I know about cancer screening? Many types of cancers can be detected early and may often be prevented. Depending on your health history and family history, you may need to have cancer screening at various ages. This may include screening for: Colorectal cancer. Prostate cancer. Skin cancer. Lung cancer. What should I know about heart disease, diabetes, and high blood pressure? Blood pressure and heart disease High blood pressure causes heart disease and increases the risk of stroke. This is more likely to develop in people who have high blood pressure readings or are overweight. Talk with your health care provider about your target blood pressure readings. Have your blood pressure checked: Every 3-5 years if you are 102-67 years of age. Every year if you are 67 years old or older. If you are between the ages of 48 and 53 and are a current or former smoker, ask your health care provider if you should have a one-time screening for abdominal aortic aneurysm (AAA). Diabetes Have regular diabetes screenings. This checks your fasting blood sugar level. Have the screening done: Once every three years after age 51 if you are at a normal weight and have a low risk for diabetes. More often and at a younger age if you are overweight or have a high risk for diabetes. What should I know about preventing infection? Hepatitis B If you have a higher risk for hepatitis B, you should be screened for this virus. Talk with your health care  provider to find out if you are at risk for hepatitis B infection. Hepatitis C Blood testing is recommended for: Everyone born from [birth year] through 1963. Anyone with known risk factors for hepatitis C. Sexually transmitted infections (STIs) You should be screened each year for STIs, including gonorrhea and chlamydia, if: You are sexually active and are younger than 69 years of age. You are older than 69  years of age and your health care provider tells you that you are at risk for this type of infection. Your sexual activity has changed since you were last screened, and you are at increased risk for chlamydia or gonorrhea. Ask your health care provider if you are at risk. Ask your health care provider about whether you are at high risk for HIV. Your health care provider may recommend a prescription medicine to help prevent HIV infection. If you choose to take medicine to prevent HIV, you should first get tested for HIV. You should then be tested every 3 months for as long as you are taking the medicine. Follow these instructions at home: Alcohol use Do not drink alcohol if your health care provider tells you not to drink. If you drink alcohol: Limit how much you have to 0-2 drinks a day. Know how much alcohol is in your drink. In the [place] one drink equals one 12 oz bottle of beer (355 mL), one 5 oz glass of wine (148 mL), or one 1 oz glass of hard liquor (44 mL). Lifestyle Do not use any products that contain nicotine or tobacco. These products include cigarettes, chewing tobacco, and vaping devices, such as e-cigarettes. If you need help quitting, ask your health care provider. Do not use street drugs. Do not share needles. Ask your health care provider for help if you need support or information about quitting drugs. General instructions Schedule regular health, dental, and eye exams. Stay current with your vaccines. Tell your health care provider if: You often feel depressed. You have ever been abused or do not feel safe at home. Summary Adopting a healthy lifestyle and getting preventive care are important in promoting health and wellness. Follow your health care provider's instructions about healthy diet, exercising, and getting tested or screened for diseases. Follow your health care provider's instructions on monitoring your cholesterol and blood pressure. This information is not  intended to replace advice given to you by your health care provider. Make sure you discuss any questions you have with your health care provider. Document Revised: 07/28/2020 Document Reviewed: 07/28/2020 Elsevier Patient Education  [employer] [employer].

## 2021-04-22 NOTE — Progress Notes (Signed)
Subjective:    Patient ID: James Meeker., male    DOB: 1950/12/12, 71 y.o.   MRN: [id number]   This visit occurred during the SARS-CoV-2 public health emergency.  Safety protocols were in place, including screening questions prior to the visit, additional usage of staff PPE, and extensive cleaning of exam room while observing appropriate contact time as indicated for disinfecting solutions.   HPI He is here for follow up of his chronic medical conditions, including htn, hld, prediabetes,hypokalemia, GERD, carotid artery stenosis  He had his blood work done previously.  He has no concerns. Medications and allergies reviewed with patient and updated if appropriate.  Patient Active Problem List   Diagnosis Date Noted   Personal history of melanoma in-situ 04/22/2021   Mod basal-septal LVH, grade 1 DD 05/22/2020   Carotid stenosis, left 05/21/2020   Left epiretinal membrane 12/06/2019   Scar, chorioretinal 12/06/2019   Degenerative retinal drusen of right eye 12/06/2019   Posterior vitreous detachment of left eye 12/06/2019   History of retinal detachment 12/06/2019   Dupuytren's contracture of left hand 01/27/2017   Prediabetes 01/27/2017   Hypokalemia 01/27/2016   Lumbar spondylosis 12/20/2014   Hyperlipidemia 04/07/2007   Essential hypertension 04/07/2007   GERD 04/07/2007    Current Outpatient Medications on File Prior to Visit  Medication Sig Dispense Refill   albuterol (VENTOLIN HFA) 108 (90 Base) MCG/ACT inhaler Inhale 2 puffs into the lungs every 2 (two) hours as needed for wheezing or shortness of breath (cough). 8 g 0   aspirin EC 81 MG tablet Take 81 mg by mouth daily.     diphenhydrAMINE HCl, Sleep, (SLEEP AID) 50 MG CAPS Take 1 capsule by mouth at bedtime. As needed     fluticasone (FLONASE) 50 MCG/ACT nasal spray Place 2 sprays into both nostrils daily. 9.9 g 0   ibuprofen (ADVIL) 200 MG tablet Take 600 mg by mouth daily as needed. Takes 2 times a week      omeprazole (PRILOSEC) 40 MG capsule Take 40 mg by mouth daily as needed. Taking 2-3 times weekly     No current facility-administered medications on file prior to visit.    Past Medical History:  Diagnosis Date   Angina    Anxiety    especially revolving around surgery & medical care    GERD (gastroesophageal reflux disease)    Heart murmur    Hemorrhoids    History of stress test 2013   told it was normal   Hyperlipidemia    Hypertension    Neuromuscular disorder (HCC)    spinal stenosis    Shortness of breath     Past Surgical History:  Procedure Laterality Date   APPENDECTOMY     CERVICAL DISC SURGERY     COLONOSCOPY     EYE SURGERY  09&12/2009   x3- (total of 7 eye surgery)    LUMBAR LAMINECTOMY  1990's   x2   TESTICLE TORSION REDUCTION     UPPER GASTROINTESTINAL ENDOSCOPY      Social History   Socioeconomic History   Marital status: Married    Spouse name: Not on file   Number of children: Not on file   Years of education: Not on file   Highest education level: Not on file  Occupational History   Not on file  Tobacco Use   Smoking status: Never   Smokeless tobacco: Never  Vaping Use   Vaping Use: Never used  Substance and Sexual Activity  Alcohol use: Yes    Comment: 1 bottle wine a week, on weekends    Drug use: Not Currently    Types: Marijuana    Comment: recreationally- early August- 2016   Sexual activity: Yes  Other Topics Concern   Not on file  Social History Narrative   [employer] driver    No tobacco   No ets    Married    Wife works Scientist, research (life sciences)   Social Determinants of Radio broadcast assistant Strain: Not on file  Food Insecurity: Not on file  Transportation Needs: Not on file  Physical Activity: Not on file  Stress: Not on file  Social Connections: Not on file    Family History  Problem Relation Age of Onset   Cancer Mother        skin cancer/melanoma   Hypertension Father    Heart disease Father    Colon  cancer Neg Hx     Review of Systems  Constitutional:  Negative for chills and fever.  Respiratory:  Negative for cough, shortness of breath and wheezing.   Cardiovascular:  Negative for chest pain, palpitations and leg swelling.  Gastrointestinal:  Negative for abdominal pain, blood in stool, constipation, diarrhea and nausea.       Occ gerd  Genitourinary:  Negative for difficulty urinating, dysuria and hematuria.  Musculoskeletal:  Negative for arthralgias and back pain.  Neurological:  Negative for dizziness, light-headedness and headaches.  Psychiatric/Behavioral:  Negative for dysphoric mood. The patient is not nervous/anxious.       Objective:   Vitals:   04/23/21 1100  BP: 128/78  Pulse: 80  Temp: 98.4 F (36.9 C)  SpO2: 97%   Filed Weights   04/23/21 1100  Weight: 246 lb (111.6 kg)   Body mass index is 29.94 kg/m.  BP Readings from Last 3 Encounters:  04/23/21 128/78  12/31/20 130/84  05/29/20 117/79    Wt Readings from Last 3 Encounters:  04/23/21 246 lb (111.6 kg)  12/31/20 236 lb 9.6 oz (107.3 kg)  05/29/20 240 lb (108.9 kg)     Physical Exam Constitutional: He appears well-developed and well-nourished. No distress.  HENT:  Head: Normocephalic and atraumatic.  Right Ear: External ear normal.  Left Ear: External ear normal.  Mouth/Throat: Oropharynx is clear and moist.  Normal ear canals and TM b/l  Eyes: Conjunctivae and EOM are normal.  Neck: Neck supple. No tracheal deviation present. No thyromegaly present. No carotid bruit  Cardiovascular: Normal rate, regular rhythm, normal heart sounds and intact distal pulses.  No murmur heard. Pulmonary/Chest: Effort normal and breath sounds normal. No respiratory distress. He has no wheezes. He has no rales.  Abdominal: Soft. He exhibits no distension. There is no tenderness.  Musculoskeletal: He exhibits no edema.  Lymphadenopathy:   He has no cervical adenopathy.  Skin: Skin is warm and dry. He is not  diaphoretic.  Psychiatric: He has a normal mood and affect. His behavior is normal.    Lab Results  Component Value Date   WBC 6.8 04/17/2021   HGB 14.9 04/17/2021   HCT 43.3 04/17/2021   PLT 228.0 04/17/2021   GLUCOSE 107 (H) 04/17/2021   CHOL 171 04/17/2021   TRIG 101.0 04/17/2021   HDL 45.90 04/17/2021   LDLCALC 105 (H) 04/17/2021   ALT 16 04/17/2021   AST 16 04/17/2021   NA 140 04/17/2021   K 3.5 04/17/2021   CL 100 04/17/2021   CREATININE 1.11 04/17/2021  BUN 25 (H) 04/17/2021   CO2 32 04/17/2021   TSH 1.76 04/10/2020   PSA 2.28 04/17/2021   INR 0.99 05/28/2011   HGBA1C 6.0 04/17/2021        Assessment & Plan:   He is walking two miles a day   Health Maintenance  Topic Date Due   COVID-19 Vaccine (4 - Booster for Pfizer series) 05/09/2021 (Originally 03/20/2020)   COLONOSCOPY (Pts 45-33yrs Insurance coverage will need to be confirmed)  08/09/2021   TETANUS/TDAP  01/30/2024   Pneumonia Vaccine 9+ Years old  Completed   INFLUENZA VACCINE  Completed   Hepatitis C Screening  Completed   Zoster Vaccines- Shingrix  Completed   HPV VACCINES  Aged Out     See Problem List for Assessment and Plan of chronic medical problems.

## 2021-04-23 ENCOUNTER — Ambulatory Visit: Payer: Medicare Other

## 2021-04-23 ENCOUNTER — Ambulatory Visit (INDEPENDENT_AMBULATORY_CARE_PROVIDER_SITE_OTHER): Payer: Medicare Other | Admitting: Internal Medicine

## 2021-04-23 ENCOUNTER — Other Ambulatory Visit: Payer: Self-pay

## 2021-04-23 VITALS — BP 128/78 | HR 80 | Temp 98.4°F | Ht 76.0 in | Wt 246.0 lb

## 2021-04-23 DIAGNOSIS — R7303 Prediabetes: Secondary | ICD-10-CM | POA: Diagnosis not present

## 2021-04-23 DIAGNOSIS — Z86006 Personal history of melanoma in-situ: Secondary | ICD-10-CM | POA: Diagnosis not present

## 2021-04-23 DIAGNOSIS — I10 Essential (primary) hypertension: Secondary | ICD-10-CM

## 2021-04-23 DIAGNOSIS — I6522 Occlusion and stenosis of left carotid artery: Secondary | ICD-10-CM | POA: Diagnosis not present

## 2021-04-23 DIAGNOSIS — K219 Gastro-esophageal reflux disease without esophagitis: Secondary | ICD-10-CM

## 2021-04-23 DIAGNOSIS — E782 Mixed hyperlipidemia: Secondary | ICD-10-CM

## 2021-04-23 DIAGNOSIS — Z0000 Encounter for general adult medical examination without abnormal findings: Secondary | ICD-10-CM

## 2021-04-23 DIAGNOSIS — E876 Hypokalemia: Secondary | ICD-10-CM | POA: Diagnosis not present

## 2021-04-23 MED ORDER — DOXAZOSIN MESYLATE 8 MG PO TABS: 8.0000 mg | ORAL_TABLET | Freq: Every day | ORAL | 3 refills | Status: DC

## 2021-04-23 MED ORDER — ATENOLOL-CHLORTHALIDONE 50-25 MG PO TABS: 1.0000 | ORAL_TABLET | Freq: Every day | ORAL | 3 refills | Status: DC

## 2021-04-23 MED ORDER — POTASSIUM CHLORIDE CRYS ER 20 MEQ PO TBCR: EXTENDED_RELEASE_TABLET | ORAL | 3 refills | Status: DC

## 2021-04-23 MED ORDER — ROSUVASTATIN CALCIUM 40 MG PO TABS: 40.0000 mg | ORAL_TABLET | Freq: Every day | ORAL | 3 refills | Status: DC

## 2021-04-23 NOTE — Assessment & Plan Note (Signed)
Chronic Lab Results  Component Value Date   HGBA1C 6.0 04/17/2021   Sugars in prediabetic range Stressed regular exercise, diabetic diet and weight loss

## 2021-04-23 NOTE — Assessment & Plan Note (Addendum)
Chronic Blood pressure well controlled Continue atenolol-chlorthalidone 50-25 mg daily, doxazosin 8 mg nightly

## 2021-04-23 NOTE — Assessment & Plan Note (Signed)
Chronic Continue potassium chloride 40 mill equivalents twice daily

## 2021-04-23 NOTE — Assessment & Plan Note (Signed)
History of melanoma Sees dermatology twice a year

## 2021-04-23 NOTE — Progress Notes (Deleted)
Chronic Care Management Pharmacy Note  04/23/2021 Name:  James [name] MRN:  [id number] DOB:  Jan 09, 1951  Summary: - Patient reports that he has been doing well since last visit with PCP, denies any issues or concerns with his current medications  - Notes that he has not had any issues with hypo/hypertension, agreeable to start testing blood pressure at least once weekly, patient to make clinic aware should blood pressure average >130/80  Recommendations/Changes made from today's visit: -no changes to medications at this time, patient to continue with daily exercise, as well as continue reduced sodium diet, patient to continue moderation of foods high in cholesterol as well. -Patient to start to monitor blood pressure once weekly  Subjective: James [name] is an 71 y.o. year old male who is a primary patient of Burns, Claudina Lick, MD.  The CCM team was consulted for assistance with disease management and care coordination needs.    Engaged with patient face to face for follow up visit in response to provider referral for pharmacy case management and/or care coordination services.   Consent to Services:  The patient was given the following information about Chronic Care Management services today, agreed to services, and gave verbal consent: 1. CCM service includes personalized support from designated clinical staff supervised by the primary care provider, including individualized plan of care and coordination with other care providers 2. 24/7 contact phone numbers for assistance for urgent and routine care needs. 3. Service will only be billed when office clinical staff spend 20 minutes or more in a month to coordinate care. 4. Only one practitioner may furnish and bill the service in a calendar month. 5.The patient may stop CCM services at any time (effective at the end of the month) by phone call to the office staff. 6. The patient will be responsible for cost sharing (co-pay) of up to 20% of  the service fee (after annual deductible is met). Patient agreed to services and consent obtained.  Patient Care Team: Binnie Rail, MD as PCP - General (Internal Medicine) Zadie Rhine Clent Demark, MD as Consulting Physician (Ophthalmology) Delice Bison Darnelle Maffucci, [facility] as Pharmacist (Pharmacist) Hurman Horn, MD as Consulting Physician (Ophthalmology)  Recent office visits: 04/23/2021 - Dr. Quay Burow - office visit completed today  11/21/2020 - Jarrett Soho Muthersbaugh PA-C - televideo visit - COVID 19  - prescribed molnupiravir, zofran, albuterol, flonase,  APAP/IBU, and tessalon prn   Recent consult visits: 12/31/2020 - Risa Grill PA-C - Vascular - no changes to medications   Hospital visits: None in previous 6 months  Objective:  Lab Results  Component Value Date   CREATININE 1.11 04/17/2021   BUN 25 (H) 04/17/2021   GFR 68.36 04/17/2021   GFRNONAA >60 12/21/2014   GFRAA >60 12/21/2014   NA 140 04/17/2021   K 3.5 04/17/2021   CALCIUM 9.4 04/17/2021   CO2 32 04/17/2021   GLUCOSE 107 (H) 04/17/2021    Lab Results  Component Value Date/Time   HGBA1C 6.0 04/17/2021 09:19 AM   HGBA1C 5.9 04/10/2020 10:30 AM   GFR 68.36 04/17/2021 09:19 AM   GFR 71.95 04/10/2020 10:30 AM    Last diabetic Eye exam:  Lab Results  Component Value Date/Time   HMDIABEYEEXA No Retinopathy 11/20/2014 12:00 AM    Last diabetic Foot exam:  No results found for: HMDIABFOOTEX   Lab Results  Component Value Date   CHOL 171 04/17/2021   HDL 45.90 04/17/2021   LDLCALC 105 (H) 04/17/2021   TRIG  101.0 04/17/2021   CHOLHDL 4 04/17/2021    Hepatic Function Latest Ref Rng & Units 04/17/2021 04/10/2020 04/12/2019  Total Protein 6.0 - 8.3 g/dL 6.6 6.7 6.5  Albumin 3.5 - 5.2 g/dL 4.3 4.3 4.2  AST 0 - 37 U/L '16 16 21  ' ALT 0 - 53 U/L '16 17 20  ' Alk Phosphatase 39 - 117 U/L 53 59 63  Total Bilirubin 0.2 - 1.2 mg/dL 0.6 0.9 0.7  Bilirubin, Direct 0.0 - 0.3 mg/dL - - -    Lab Results  Component Value Date/Time   TSH  1.76 04/10/2020 10:30 AM   TSH 1.90 04/12/2019 08:36 AM    CBC Latest Ref Rng & Units 04/17/2021 04/10/2020 04/12/2019  WBC 4.0 - 10.5 K/uL 6.8 7.0 5.9  Hemoglobin 13.0 - 17.0 g/dL 14.9 14.9 14.8  Hematocrit 39.0 - 52.0 % 43.3 43.5 43.6  Platelets 150.0 - 400.0 K/uL 228.0 230.0 214.0    No results found for: VD25OH  Clinical ASCVD: No  The 10-year ASCVD risk score (Arnett DK, et al., 2019) is: 17.9%   Values used to calculate the score:     Age: 76 years     Sex: Male     Is Non-Hispanic African American: No     Diabetic: No     Tobacco smoker: No     Systolic Blood Pressure: 510 mmHg     Is BP treated: Yes     HDL Cholesterol: 45.9 mg/dL     Total Cholesterol: 171 mg/dL    Depression screen [facility] 2/9 04/18/2020 04/17/2019 01/27/2017  Decreased Interest 0 0 0  Down, Depressed, Hopeless 0 0 0  PHQ - 2 Score 0 0 0    Social History   Tobacco Use  Smoking Status Never  Smokeless Tobacco Never   BP Readings from Last 3 Encounters:  12/31/20 130/84  05/29/20 117/79  04/21/20 140/78   Pulse Readings from Last 3 Encounters:  12/31/20 (!) 57  05/29/20 63  04/21/20 80   Wt Readings from Last 3 Encounters:  04/23/21 246 lb (111.6 kg)  12/31/20 236 lb 9.6 oz (107.3 kg)  05/29/20 240 lb (108.9 kg)   BMI Readings from Last 3 Encounters:  04/23/21 29.94 kg/m  12/31/20 28.80 kg/m  05/29/20 29.21 kg/m    Assessment/Interventions: Review of patient past medical history, allergies, medications, health status, including review of consultants reports, laboratory and other test data, was performed as part of comprehensive evaluation and provision of chronic care management services.   SDOH:  (Social Determinants of Health) assessments and interventions performed: Yes  SDOH Screenings   Alcohol Screen: Not on file  Depression (PHQ2-9): Not on file  Financial Resource Strain: Not on file  Food Insecurity: Not on file  Housing: Not on file  Physical Activity: Not on file   Social Connections: Not on file  Stress: Not on file  Tobacco Use: Low Risk    Smoking Tobacco Use: Never   Smokeless Tobacco Use: Never   Passive Exposure: Not on file  Transportation Needs: Not on file    [place]  Allergies  Allergen Reactions   Hydrocodone Itching    Medications Reviewed Today     Reviewed by Marcina Millard, CMA (Certified Medical Assistant) on 04/23/21 at 1100  Med List Status: <None>   Medication Order Taking? Sig Documenting Provider Last Dose Status Informant  albuterol (VENTOLIN HFA) 108 (90 Base) MCG/ACT inhaler [id number] Yes Inhale 2 puffs into the lungs every 2 (two)  hours as needed for wheezing or shortness of breath (cough). Muthersbaugh, Gwenlyn Perking Taking Active   aspirin EC 81 MG tablet [id number] Yes Take 81 mg by mouth daily. [provider] Taking Active   atenolol-chlorthalidone (TENORETIC) 50-25 MG tablet [id number] Yes TAKE ONE TABLET BY MOUTH DAILY Burns, Claudina Lick, MD Taking Active   diphenhydrAMINE HCl, Sleep, (SLEEP AID) 50 MG CAPS [id number] Yes Take 1 capsule by mouth at bedtime. As needed [provider] Taking Active   doxazosin (CARDURA) 8 MG tablet [id number] Yes TAKE ONE TABLET BY MOUTH EVERY NIGHT AT BEDTIME Burns, Claudina Lick, MD Taking Active   fluticasone (FLONASE) 50 MCG/ACT nasal spray [id number] Yes Place 2 sprays into both nostrils daily. Muthersbaugh, Jarrett Soho, PA-C Taking Active   ibuprofen (ADVIL) 200 MG tablet [id number] Yes Take 600 mg by mouth daily as needed. Takes 2 times a week [provider] Taking Active   omeprazole (PRILOSEC) 40 MG capsule [id number] Yes Take 40 mg by mouth daily as needed. 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 04/22/2021   Mod basal-septal LVH, grade 1 DD 05/22/2020   Carotid stenosis, left 05/21/2020   Left epiretinal membrane 12/06/2019   Scar, chorioretinal 12/06/2019   Degenerative retinal drusen of right eye 12/06/2019   Posterior vitreous detachment of left eye 12/06/2019   History of retinal detachment 12/06/2019   Dupuytren's contracture of left hand 01/27/2017   Prediabetes 01/27/2017   Hypokalemia 01/27/2016   Lumbar spondylosis 12/20/2014   Hyperlipidemia 04/07/2007   Essential hypertension 04/07/2007   GERD 04/07/2007    Immunization History  Administered Date(s) Administered   Influenza Whole 12/21/2006, 12/20/2008   Influenza, Quadrivalent, Recombinant, Inj, Pf 12/15/2017   Influenza,inj,Quad PF,6+ Mos 01/02/2013, 01/22/2015, 01/13/2016, 01/13/2017, 12/21/2018   Influenza-Unspecified 01/10/2014, 01/09/2016, 12/15/2017   PFIZER(Purple Top)SARS-COV-2 Vaccination 05/24/2019, 06/19/2019, 01/24/2020   Pneumococcal Conjugate-13 04/12/2018   Pneumococcal Polysaccharide-23 04/17/2019   Td 03/23/2003   Tdap 01/29/2014   Zoster Recombinat (Shingrix) 08/23/2019, 11/29/2019   Zoster, Live 01/02/2013    Conditions to be addressed/monitored:  Hypertension and Hyperlipidemia  There are no care plans that you recently modified to display for this patient.      Medication Assistance: None required.  Patient affirms current coverage meets needs.  Patient's preferred pharmacy is:  [id number] - [place], [place] [address] [place] [address] Phone: [phone] Fax: [phone]   Uses pill box? Yes Pt endorses 100% compliance  Care Plan and Follow Up Patient Decision:  Patient agrees to Care Plan and Follow-up.  Plan: Face to Face appointment with care management team member  scheduled for: 6 months and The patient has been provided with contact information for the care management team and has been advised to call with any health related questions or concerns.   Tomasa Blase, PharmD Clinical Pharmacist, [place]

## 2021-04-23 NOTE — Assessment & Plan Note (Signed)
Chronic Regular exercise and healthy diet encouraged LDL not at goal-should be less than 70 given carotid artery stenosis Will change simvastatin 40 mg daily to Crestor 40 mg daily Recheck lipids in 6 weeks

## 2021-04-23 NOTE — Assessment & Plan Note (Signed)
Chronic GERD controlled Continue omeprazole 40 mg daily as needed 

## 2021-04-23 NOTE — Assessment & Plan Note (Signed)
Chronic Monitored by vascular LDL goal less than 70-LDL is not at goal-we will switch simvastatin 40 mg to Crestor 40 mg

## 2021-04-28 ENCOUNTER — Other Ambulatory Visit: Payer: Self-pay

## 2021-04-28 ENCOUNTER — Ambulatory Visit (INDEPENDENT_AMBULATORY_CARE_PROVIDER_SITE_OTHER): Payer: Medicare Other

## 2021-04-28 DIAGNOSIS — Z0000 Encounter for general adult medical examination without abnormal findings: Secondary | ICD-10-CM | POA: Diagnosis not present

## 2021-04-28 NOTE — Patient Instructions (Signed)
[name] , Thank you for taking time to come for your Medicare Wellness Visit. I appreciate your ongoing commitment to your health goals. Please review the following plan we discussed and let me know if I can assist you in the future.   Screening recommendations/referrals: Colonoscopy: 08/10/2011; due very 10 years; (due 08/09/2021) Recommended yearly ophthalmology/optometry visit for glaucoma screening and checkup Recommended yearly dental visit for hygiene and checkup  Vaccinations: Influenza vaccine: 12/31/2020 Pneumococcal vaccine: 04/12/2018, 04/17/2019 Tdap vaccine: 01/29/2014; due every 10 years (due 01/30/2024) Shingles vaccine: 08/23/2019, 11/29/2019   Covid-19: 05/24/2019, 06/19/2019, 01/24/2020  Advanced directives: Please bring a copy of your health care power of attorney and living will to the office at your convenience.  Conditions/risks identified: Yes; Patient stated: "My goal is to continue on the right path as advised by my PCP."  Next appointment: Please schedule your next Medicare Wellness Visit with your Nurse Health Advisor in 1 year by calling [phone].  Preventive Care 4 Years and Older, Male Preventive care refers to lifestyle choices and visits with your health care provider that can promote health and wellness. What does preventive care include? A yearly physical exam. This is also called an annual well check. Dental exams once or twice a year. Routine eye exams. Ask your health care provider how often you should have your eyes checked. Personal lifestyle choices, including: Daily care of your teeth and gums. Regular physical activity. Eating a healthy diet. Avoiding tobacco and drug use. Limiting alcohol use. Practicing safe sex. Taking low doses of aspirin every day. Taking vitamin and mineral supplements as recommended by your health care provider. What happens during an annual well check? The services and screenings done by your health care provider  during your annual well check will depend on your age, overall health, lifestyle risk factors, and family history of disease. Counseling  Your health care provider may ask you questions about your: Alcohol use. Tobacco use. Drug use. Emotional well-being. Home and relationship well-being. Sexual activity. Eating habits. History of falls. Memory and ability to understand (cognition). Work and work Statistician. Screening  You may have the following tests or measurements: Height, weight, and BMI. Blood pressure. Lipid and cholesterol levels. These may be checked every 5 years, or more frequently if you are over 13 years old. Skin check. Lung cancer screening. You may have this screening every year starting at age 37 if you have a 30-pack-year history of smoking and currently smoke or have quit within the past 15 years. Fecal occult blood test (FOBT) of the stool. You may have this test every year starting at age 77. Flexible sigmoidoscopy or colonoscopy. You may have a sigmoidoscopy every 5 years or a colonoscopy every 10 years starting at age 59. Prostate cancer screening. Recommendations will vary depending on your family history and other risks. Hepatitis C blood test. Hepatitis B blood test. Sexually transmitted disease (STD) testing. Diabetes screening. This is done by checking your blood sugar (glucose) after you have not eaten for a while (fasting). You may have this done every 1-3 years. Abdominal aortic aneurysm (AAA) screening. You may need this if you are a current or former smoker. Osteoporosis. You may be screened starting at age 72 if you are at high risk. Talk with your health care provider about your test results, treatment options, and if necessary, the need for more tests. Vaccines  Your health care provider may recommend certain vaccines, such as: Influenza vaccine. This is recommended every year. Tetanus, diphtheria, and  acellular pertussis (Tdap, Td) vaccine. You  may need a Td booster every 10 years. Zoster vaccine. You may need this after age 53. Pneumococcal 13-valent conjugate (PCV13) vaccine. One dose is recommended after age 34. Pneumococcal polysaccharide (PPSV23) vaccine. One dose is recommended after age 75. Talk to your health care provider about which screenings and vaccines you need and how often you need them. This information is not intended to replace advice given to you by your health care provider. Make sure you discuss any questions you have with your health care provider. Document Released: 04/04/2015 Document Revised: 11/26/2015 Document Reviewed: 01/07/2015 Elsevier Interactive Patient Education  2017 [employer] Prevention in the Home Falls can cause injuries. They can happen to people of all ages. There are many things you can do to make your home safe and to help prevent falls. What can I do on the outside of my home? Regularly fix the edges of walkways and driveways and fix any cracks. Remove anything that might make you trip as you walk through a door, such as a raised step or threshold. Trim any bushes or trees on the path to your home. Use bright outdoor lighting. Clear any walking paths of anything that might make someone trip, such as rocks or tools. Regularly check to see if handrails are loose or broken. Make sure that both sides of any steps have handrails. Any raised decks and porches should have guardrails on the edges. Have any leaves, snow, or ice cleared regularly. Use sand or salt on walking paths during winter. Clean up any spills in your garage right away. This includes oil or grease spills. What can I do in the bathroom? Use night lights. Install grab bars by the toilet and in the tub and shower. Do not use towel bars as grab bars. Use non-skid mats or decals in the tub or shower. If you need to sit down in the shower, use a plastic, non-slip stool. Keep the floor dry. Clean up any water that spills  on the floor as soon as it happens. Remove soap buildup in the tub or shower regularly. Attach bath mats securely with double-sided non-slip rug tape. Do not have throw rugs and other things on the floor that can make you trip. What can I do in the bedroom? Use night lights. Make sure that you have a light by your bed that is easy to reach. Do not use any sheets or blankets that are too big for your bed. They should not hang down onto the floor. Have a firm chair that has side arms. You can use this for support while you get dressed. Do not have throw rugs and other things on the floor that can make you trip. What can I do in the kitchen? Clean up any spills right away. Avoid walking on wet floors. Keep items that you use a lot in easy-to-reach places. If you need to reach something above you, use a strong step stool that has a grab bar. Keep electrical cords out of the way. Do not use floor polish or wax that makes floors slippery. If you must use wax, use non-skid floor wax. Do not have throw rugs and other things on the floor that can make you trip. What can I do with my stairs? Do not leave any items on the stairs. Make sure that there are handrails on both sides of the stairs and use them. Fix handrails that are broken or loose. Make sure that  handrails are as long as the stairways. Check any carpeting to make sure that it is firmly attached to the stairs. Fix any carpet that is loose or worn. Avoid having throw rugs at the top or bottom of the stairs. If you do have throw rugs, attach them to the floor with carpet tape. Make sure that you have a light switch at the top of the stairs and the bottom of the stairs. If you do not have them, ask someone to add them for you. What else can I do to help prevent falls? Wear shoes that: Do not have high heels. Have rubber bottoms. Are comfortable and fit you well. Are closed at the toe. Do not wear sandals. If you use a stepladder: Make  sure that it is fully opened. Do not climb a closed stepladder. Make sure that both sides of the stepladder are locked into place. Ask someone to hold it for you, if possible. Clearly mark and make sure that you can see: Any grab bars or handrails. First and last steps. Where the edge of each step is. Use tools that help you move around (mobility aids) if they are needed. These include: Canes. Walkers. Scooters. Crutches. Turn on the lights when you go into a dark area. Replace any light bulbs as soon as they burn out. Set up your furniture so you have a clear path. Avoid moving your furniture around. If any of your floors are uneven, fix them. If there are any pets around you, be aware of where they are. Review your medicines with your doctor. Some medicines can make you feel dizzy. This can increase your chance of falling. Ask your doctor what other things that you can do to help prevent falls. This information is not intended to replace advice given to you by your health care provider. Make sure you discuss any questions you have with your health care provider. Document Released: 01/02/2009 Document Revised: 08/14/2015 Document Reviewed: 04/12/2014 Elsevier Interactive Patient Education  2017 [employer].

## 2021-04-28 NOTE — Progress Notes (Signed)
I connected with James [name], James [name] today by telephone and verified that I am speaking with the correct person using two identifiers. Location patient: home Location provider: work Persons participating in the virtual visit: patient, provider.   I discussed the limitations, risks, security and privacy concerns of performing an evaluation and management service by telephone and the availability of in person appointments. I also discussed with the patient that there may be a patient responsible charge related to this service. The patient expressed understanding and verbally consented to this telephonic visit.    Interactive audio and video telecommunications were attempted between this provider and patient, however failed, due to patient having technical difficulties OR patient did not have access to video capability.  We continued and completed visit with audio only.  Some vital signs may be absent or patient reported.   Time Spent with patient on telephone encounter: 40 minutes  Subjective:   James [name] is a 71 y.o. male who presents for Medicare Annual/Subsequent preventive examination.  Review of Systems     Cardiac Risk Factors include: advanced age (>102men, >45 women);dyslipidemia;family history of premature cardiovascular disease;hypertension;male gender     Objective:    There were no vitals filed for this visit. There is no height or weight on file to calculate BMI.  Advanced Directives 04/28/2021 04/18/2020 12/11/2014 11/04/2014 05/27/2011  Does Patient Have a Medical Advance Directive? Yes Yes Yes Yes Patient has advance directive, copy not in chart  Type of Advance Directive Living will;Healthcare Power of Attorney Living will;Healthcare Power of [place];Living will Living will (No Data)  Does patient want to make changes to medical advance directive? No - Patient declined No - Patient declined No - Patient declined No - Patient declined -  Copy  of [place] in Chart? No - copy requested No - copy requested No - copy requested - -    Current Medications (verified) Outpatient Encounter Medications as of 04/28/2021  Medication Sig   albuterol (VENTOLIN HFA) 108 (90 Base) MCG/ACT inhaler Inhale 2 puffs into the lungs every 2 (two) hours as needed for wheezing or shortness of breath (cough).   aspirin EC 81 MG tablet Take 81 mg by mouth daily.   atenolol-chlorthalidone (TENORETIC) 50-25 MG tablet Take 1 tablet by mouth daily.   diphenhydrAMINE HCl, Sleep, (SLEEP AID) 50 MG CAPS Take 1 capsule by mouth at bedtime. As needed   doxazosin (CARDURA) 8 MG tablet Take 1 tablet (8 mg total) by mouth at bedtime.   fluticasone (FLONASE) 50 MCG/ACT nasal spray Place 2 sprays into both nostrils daily.   ibuprofen (ADVIL) 200 MG tablet Take 600 mg by mouth daily as needed. Takes 2 times a week   omeprazole (PRILOSEC) 40 MG capsule Take 40 mg by mouth daily as needed. Taking 2-3 times weekly   potassium chloride SA (KLOR-CON M20) 20 MEQ tablet TAKE TWO TABLETS BY MOUTH TWICE A DAY   rosuvastatin (CRESTOR) 40 MG tablet Take 1 tablet (40 mg total) by mouth daily.   No facility-administered encounter medications on file as of 04/28/2021.    Allergies (verified) Hydrocodone   History: Past Medical History:  Diagnosis Date   Angina    Anxiety    especially revolving around surgery & medical care    GERD (gastroesophageal reflux disease)    Heart murmur    Hemorrhoids    History of stress test 2013   told it was normal   Hyperlipidemia  Hypertension    Neuromuscular disorder ([place])    spinal stenosis    Shortness of breath    Past Surgical History:  Procedure Laterality Date   APPENDECTOMY     CERVICAL DISC SURGERY     COLONOSCOPY     EYE SURGERY  09&12/2009   x3- (total of 7 eye surgery)    LUMBAR LAMINECTOMY  1990's   x2   TESTICLE TORSION REDUCTION     UPPER GASTROINTESTINAL ENDOSCOPY     Family History   Problem Relation Age of Onset   Cancer Mother        skin cancer/melanoma   Hypertension Father    Heart disease Father    Colon cancer Neg Hx    Social History   Socioeconomic History   Marital status: Married    Spouse name: Not on file   Number of children: Not on file   Years of education: Not on file   Highest education level: Not on file  Occupational History   Not on file  Tobacco Use   Smoking status: Never   Smokeless tobacco: Never  Vaping Use   Vaping Use: Never used  Substance and Sexual Activity   Alcohol use: Yes    Comment: 1 bottle wine a week, on weekends    Drug use: Not Currently    Types: Marijuana    Comment: recreationally- early August- 2016   Sexual activity: Yes  Other Topics Concern   Not on file  Social History Narrative   [employer] driver    No tobacco   No ets    Married    Wife works Scientist, research (life sciences)   Social Determinants of Radio broadcast assistant Strain: Low Risk    Difficulty of Paying Living Expenses: Not hard at all  Food Insecurity: No Food Insecurity   Worried About Charity fundraiser in the Last Year: Never true   Arboriculturist in the Last Year: Never true  Transportation Needs: No Transportation Needs   Lack of Transportation (Medical): No   Lack of Transportation (Non-Medical): No  Physical Activity: Sufficiently Active   Days of Exercise per Week: 7 days   Minutes of Exercise per Session: 60 min  Stress: No Stress Concern Present   Feeling of Stress : Not at all  Social Connections: Socially Integrated   Frequency of Communication with Friends and Family: More than three times a week   Frequency of Social Gatherings with Friends and Family: More than three times a week   Attends Religious Services: 1 to 4 times per year   Active Member of [employer] or Organizations: Yes   Attends Archivist Meetings: 1 to 4 times per year   Marital Status: Married    Tobacco Counseling Counseling given: Not  Answered   Clinical Intake:  Pre-visit preparation completed: Yes  Pain : No/denies pain     Nutritional Risks: None Diabetes: No  How often do you need to have someone help you when you read instructions, pamphlets, or other written materials from your doctor or pharmacy?: 1 - Never What is the last grade level you completed in school?: 5 years of college  Diabetic? no  Interpreter Needed?: No  Information entered by :: Lisette Abu, LPN   Activities of Daily Living In your present state of health, do you have any difficulty performing the following activities: 04/28/2021  Hearing? N  Vision? N  Difficulty concentrating or making decisions? N  Walking or climbing stairs? N  Dressing or bathing? N  Doing errands, shopping? N  Preparing Food and eating ? N  Using the Toilet? N  In the past six months, have you accidently leaked urine? N  Do you have problems with loss of bowel control? N  Managing your Medications? N  Managing your Finances? N  Housekeeping or managing your Housekeeping? N  Some recent data might be hidden    Patient Care Team: Binnie Rail, MD as PCP - General (Internal Medicine) Zadie Rhine Clent Demark, MD as Consulting Physician (Ophthalmology) Delice Bison Darnelle Maffucci, [facility] as Pharmacist (Pharmacist) Zadie Rhine Clent Demark, MD as Consulting Physician (Ophthalmology) Warden Fillers, MD as Consulting Physician (Ophthalmology)  Indicate any recent Medical Services you may have received from other than [facility] providers in the past year (date may be approximate).     Assessment:   This is a routine wellness examination for [facility].  Hearing/Vision screen Hearing Screening - Comments:: Patient denied any hearing difficulty.   No hearing aids.  Vision Screening - Comments:: Patient wears no corrective lenses.  Annual eye exam done by: Warden Fillers, MD.  Dietary issues and exercise activities discussed: Current Exercise Habits: Home exercise routine, Type of  exercise: walking, Time (Minutes): 60, Frequency (Times/Week): 7, Weekly Exercise (Minutes/Week): 420, Intensity: Moderate, Exercise limited by: None identified   Goals Addressed               This Visit's Progress     Patient Stated (pt-stated)        "My goal is to continue on the right path as advised by my PCP."      Depression Screen PHQ 2/9 Scores 04/28/2021 04/23/2021 04/18/2020 04/17/2019 01/27/2017  PHQ - 2 Score 0 0 0 0 0  PHQ- 9 Score 0 0 - - -    Fall Risk Fall Risk  04/28/2021 04/23/2021 04/18/2020 04/17/2019 01/27/2017  Falls in the past year? 0 0 0 0 No  Number falls in past yr: 0 0 0 0 -  Injury with Fall? 0 0 0 - -  Risk for fall due to : No Fall Risks No Fall Risks No Fall Risks - -  Follow up Falls evaluation completed Falls evaluation completed - - -    FALL RISK PREVENTION PERTAINING TO THE HOME:  Any stairs in or around the home? Yes  If so, are there any without handrails? No  Home free of loose throw rugs in walkways, pet beds, electrical cords, etc? Yes  Adequate lighting in your home to reduce risk of falls? Yes   ASSISTIVE DEVICES UTILIZED TO PREVENT FALLS:  Life alert? No  Use of a cane, walker or w/c? No  Grab bars in the bathroom? No  Shower chair or bench in shower? Yes  Elevated toilet seat or a handicapped toilet? No   TIMED UP AND GO:  Was the test performed? No .  Length of time to ambulate 10 feet: n/a sec.   Gait steady and fast without use of assistive device  Cognitive Function: Normal cognitive status assessed by direct observation by this Nurse Health Advisor. No abnormalities found.          Immunizations Immunization History  Administered Date(s) Administered   Influenza Whole 12/21/2006, 12/20/2008   Influenza, Quadrivalent, Recombinant, Inj, Pf 12/15/2017   Influenza,inj,Quad PF,6+ Mos 01/02/2013, 01/22/2015, 01/13/2016, 01/13/2017, 12/21/2018   Influenza-Unspecified 01/10/2014, 01/09/2016, 12/15/2017, 12/31/2020    PFIZER(Purple Top)SARS-COV-2 Vaccination 05/24/2019, 06/19/2019, 01/24/2020   Pneumococcal Conjugate-13 04/12/2018  Pneumococcal Polysaccharide-23 04/17/2019   Td 03/23/2003   Tdap 01/29/2014   Zoster Recombinat (Shingrix) 08/23/2019, 11/29/2019   Zoster, Live 01/02/2013    TDAP status: Up to date  Flu Vaccine status: Up to date  Pneumococcal vaccine status: Up to date  Covid-19 vaccine status: Completed vaccines  Qualifies for Shingles Vaccine? Yes   Zostavax completed Yes   Shingrix Completed?: Yes  Screening Tests Health Maintenance  Topic Date Due   COVID-19 Vaccine (4 - Booster for Pfizer series) 05/09/2021 (Originally 03/20/2020)   COLONOSCOPY (Pts 45-31yrs Insurance coverage will need to be confirmed)  08/09/2021   TETANUS/TDAP  01/30/2024   Pneumonia Vaccine 82+ Years old  Completed   INFLUENZA VACCINE  Completed   Hepatitis C Screening  Completed   Zoster Vaccines- Shingrix  Completed   HPV VACCINES  Aged Out    Health Maintenance  There are no preventive care reminders to display for this patient.  Colorectal cancer screening: Type of screening: Colonoscopy. Completed 08/10/2011. Repeat every 10 years  Lung Cancer Screening: (Low Dose CT Chest recommended if Age 42-80 years, 30 pack-year currently smoking OR have quit w/in 15years.) does not qualify.   Lung Cancer Screening Referral: no  Additional Screening:  Hepatitis C Screening: does qualify; Completed no  Vision Screening: Recommended annual ophthalmology exams for early detection of glaucoma and other disorders of the eye. Is the patient up to date with their annual eye exam?  Yes  Who is the provider or what is the name of the office in which the patient attends annual eye exams? Warden Fillers, MD. If pt is not established with a provider, would they like to be referred to a provider to establish care? No .   Dental Screening: Recommended annual dental exams for proper oral  hygiene  Community Resource Referral / Chronic Care Management: CRR required this visit?  No   CCM required this visit?  No      Plan:     I have personally reviewed and noted the following in the patients chart:   Medical and social history Use of alcohol, tobacco or illicit drugs  Current medications and supplements including opioid prescriptions. Patient is not currently taking opioid prescriptions. Functional ability and status Nutritional status Physical activity Advanced directives List of other physicians Hospitalizations, surgeries, and ER visits in previous 12 months Vitals Screenings to include cognitive, depression, and falls Referrals and appointments  In addition, I have reviewed and discussed with patient certain preventive protocols, quality metrics, and best practice recommendations. A written personalized care plan for preventive services as well as general preventive health recommendations were provided to patient.     Sheral Flow, LPN   05/22/2949   Nurse Notes:  Patient is cogitatively intact. There were no vitals filed for this visit. There is no height or weight on file to calculate BMI. Patient stated that he has no issues with gait or balance; does not use any assistive devices. Medications reviewed with patient; no opioid use noted.

## 2021-04-29 ENCOUNTER — Encounter (INDEPENDENT_AMBULATORY_CARE_PROVIDER_SITE_OTHER): Payer: Self-pay

## 2021-04-29 DIAGNOSIS — H34212 Partial retinal artery occlusion, left eye: Secondary | ICD-10-CM | POA: Diagnosis not present

## 2021-04-29 DIAGNOSIS — H26492 Other secondary cataract, left eye: Secondary | ICD-10-CM | POA: Diagnosis not present

## 2021-04-29 DIAGNOSIS — H40013 Open angle with borderline findings, low risk, bilateral: Secondary | ICD-10-CM | POA: Diagnosis not present

## 2021-04-29 DIAGNOSIS — Z961 Presence of intraocular lens: Secondary | ICD-10-CM | POA: Diagnosis not present

## 2021-04-29 NOTE — Progress Notes (Signed)
Encounter made in error. 

## 2021-05-22 ENCOUNTER — Encounter: Payer: Self-pay | Admitting: Gastroenterology

## 2021-06-17 NOTE — Progress Notes (Signed)
? ? ? ? ?Subjective:  ? ? Patient ID: James [name], male    DOB: 07/20/1950, 71 y.o.   MRN: [id number] ? ?This visit occurred during the SARS-CoV-2 public health emergency.  Safety protocols were in place, including screening questions prior to the visit, additional usage of staff PPE, and extensive cleaning of exam room while observing appropriate contact time as indicated for disinfecting solutions.   ? ? ?HPI ?Sedric is here for follow up of his chronic medical problems, including htn, hld ? ?Two months ago we changed his simvastatin to crestor 40 mg daily ? ? ? ? ?Medications and allergies reviewed with patient and updated if appropriate. ? ?Current Outpatient Medications on File Prior to Visit  ?Medication Sig Dispense Refill  ? albuterol (VENTOLIN HFA) 108 (90 Base) MCG/ACT inhaler Inhale 2 puffs into the lungs every 2 (two) hours as needed for wheezing or shortness of breath (cough). 8 g 0  ? aspirin EC 81 MG tablet Take 81 mg by mouth daily.    ? atenolol-chlorthalidone (TENORETIC) 50-25 MG tablet Take 1 tablet by mouth daily. 90 tablet 3  ? diphenhydrAMINE HCl, Sleep, (SLEEP AID) 50 MG CAPS Take 1 capsule by mouth at bedtime. As needed    ? doxazosin (CARDURA) 8 MG tablet Take 1 tablet (8 mg total) by mouth at bedtime. 90 tablet 3  ? fluticasone (FLONASE) 50 MCG/ACT nasal spray Place 2 sprays into both nostrils daily. 9.9 g 0  ? ibuprofen (ADVIL) 200 MG tablet Take 600 mg by mouth daily as needed. Takes 2 times a week    ? omeprazole (PRILOSEC) 40 MG capsule Take 40 mg by mouth daily as needed. Taking 2-3 times weekly    ? potassium chloride SA (KLOR-CON M20) 20 MEQ tablet TAKE TWO TABLETS BY MOUTH TWICE A DAY 360 tablet 3  ? rosuvastatin (CRESTOR) 40 MG tablet Take 1 tablet (40 mg total) by mouth daily. 90 tablet 3  ? ?No current facility-administered medications on file prior to visit.  ? ? ? ?Review of Systems ? ?   ?Objective:  ?There were no vitals filed for this visit. ?BP Readings from Last 3  Encounters:  ?04/23/21 128/78  ?12/31/20 130/84  ?05/29/20 117/79  ? ?Wt Readings from Last 3 Encounters:  ?04/23/21 246 lb (111.6 kg)  ?12/31/20 236 lb 9.6 oz (107.3 kg)  ?05/29/20 240 lb (108.9 kg)  ? ?There is no height or weight on file to calculate BMI. ? ?  ?Physical Exam ?Constitutional:   ?   General: He is not in acute distress. ?   Appearance: Normal appearance. He is not ill-appearing.  ?HENT:  ?   Head: Normocephalic and atraumatic.  ?Eyes:  ?   Conjunctiva/sclera: Conjunctivae normal.  ?Cardiovascular:  ?   Rate and Rhythm: Normal rate and regular rhythm.  ?   Heart sounds: Normal heart sounds. No murmur heard. ?Pulmonary:  ?   Effort: Pulmonary effort is normal. No respiratory distress.  ?   Breath sounds: Normal breath sounds. No wheezing or rales.  ?Musculoskeletal:  ?   Right lower leg: No edema.  ?   Left lower leg: No edema.  ?Skin: ?   General: Skin is warm and dry.  ?   Findings: No rash.  ?Neurological:  ?   Mental Status: He is alert. Mental status is at baseline.  ?Psychiatric:     ?   Mood and Affect: Mood normal.  ? ?   ? ?Lab Results  ?Component  Value Date  ? WBC 6.8 04/17/2021  ? HGB 14.9 04/17/2021  ? HCT 43.3 04/17/2021  ? PLT 228.0 04/17/2021  ? GLUCOSE 107 (H) 04/17/2021  ? CHOL 171 04/17/2021  ? TRIG 101.0 04/17/2021  ? HDL 45.90 04/17/2021  ? LDLCALC 105 (H) 04/17/2021  ? ALT 16 04/17/2021  ? AST 16 04/17/2021  ? NA 140 04/17/2021  ? K 3.5 04/17/2021  ? CL 100 04/17/2021  ? CREATININE 1.11 04/17/2021  ? BUN 25 (H) 04/17/2021  ? CO2 32 04/17/2021  ? TSH 1.76 04/10/2020  ? PSA 2.28 04/17/2021  ? INR 0.99 05/28/2011  ? HGBA1C 6.0 04/17/2021  ? ? ? ?Assessment & Plan:  ? ? ?See Problem List for Assessment and Plan of chronic medical problems.  ? ?This encounter was created in error - please disregard. ?

## 2021-06-17 NOTE — Patient Instructions (Addendum)
? ? ? ?  Blood work was ordered.   ? ? ?Medications changes include :  none  ? ? ?Your prescription(s) have been sent to your pharmacy.  ? ? ?A referral was ordered for XX.     Someone from that office will call you to schedule an appointment.  ? ? ?Return for follow up as scheduled. ? ?

## 2021-06-18 ENCOUNTER — Other Ambulatory Visit (INDEPENDENT_AMBULATORY_CARE_PROVIDER_SITE_OTHER): Payer: Medicare Other

## 2021-06-18 ENCOUNTER — Encounter: Payer: Self-pay | Admitting: Internal Medicine

## 2021-06-18 ENCOUNTER — Encounter: Payer: Medicare Other | Admitting: Internal Medicine

## 2021-06-18 DIAGNOSIS — I10 Essential (primary) hypertension: Secondary | ICD-10-CM

## 2021-06-18 DIAGNOSIS — E782 Mixed hyperlipidemia: Secondary | ICD-10-CM | POA: Diagnosis not present

## 2021-06-18 LAB — LIPID PANEL
Cholesterol: 136 mg/dL (ref 0–200)
HDL: 47.2 mg/dL (ref >39.00)
LDL Cholesterol: 69 mg/dL (ref 0–99)
NonHDL: 88.73
Total CHOL/HDL Ratio: 3
Triglycerides: 97 mg/dL (ref 0.0–149.0)
VLDL: 19.4 mg/dL (ref 0.0–40.0)

## 2021-06-18 LAB — HEPATIC FUNCTION PANEL
ALT: 23 U/L (ref 0–53)
AST: 21 U/L (ref 0–37)
Albumin: 4.3 g/dL (ref 3.5–5.2)
Alkaline Phosphatase: 51 U/L (ref 39–117)
Bilirubin, Direct: 0.1 mg/dL (ref 0.0–0.3)
Total Bilirubin: 0.7 mg/dL (ref 0.2–1.2)
Total Protein: 6.8 g/dL (ref 6.0–8.3)

## 2021-07-06 ENCOUNTER — Other Ambulatory Visit: Payer: Self-pay

## 2021-07-06 ENCOUNTER — Ambulatory Visit (AMBULATORY_SURGERY_CENTER): Payer: Medicare Other | Admitting: *Deleted

## 2021-07-06 VITALS — Ht 76.0 in | Wt 235.0 lb

## 2021-07-06 DIAGNOSIS — L821 Other seborrheic keratosis: Secondary | ICD-10-CM | POA: Diagnosis not present

## 2021-07-06 DIAGNOSIS — D2262 Melanocytic nevi of left upper limb, including shoulder: Secondary | ICD-10-CM | POA: Diagnosis not present

## 2021-07-06 DIAGNOSIS — D225 Melanocytic nevi of trunk: Secondary | ICD-10-CM | POA: Diagnosis not present

## 2021-07-06 DIAGNOSIS — Z1211 Encounter for screening for malignant neoplasm of colon: Secondary | ICD-10-CM

## 2021-07-06 DIAGNOSIS — L814 Other melanin hyperpigmentation: Secondary | ICD-10-CM | POA: Diagnosis not present

## 2021-07-06 DIAGNOSIS — D2272 Melanocytic nevi of left lower limb, including hip: Secondary | ICD-10-CM | POA: Diagnosis not present

## 2021-07-06 DIAGNOSIS — Z86006 Personal history of melanoma in-situ: Secondary | ICD-10-CM | POA: Diagnosis not present

## 2021-07-06 DIAGNOSIS — Z85820 Personal history of malignant melanoma of skin: Secondary | ICD-10-CM | POA: Diagnosis not present

## 2021-07-06 DIAGNOSIS — L578 Other skin changes due to chronic exposure to nonionizing radiation: Secondary | ICD-10-CM | POA: Diagnosis not present

## 2021-07-06 NOTE — Progress Notes (Signed)
No egg or soy allergy known to patient  ?No issues known to pt with past sedation with any surgeries or procedures ?Patient denies ever being told they had issues or difficulty with intubation  ?No FH of Malignant Hyperthermia ?Pt is not on diet pills ?Pt is not on  home 02  ?Pt is not on blood thinners  ?Pt denies issues with constipation  ?No A fib or A flutter ? ? Coupon to pt in PV today , Code to Pharmacy and  NO PA's for preps discussed with pt In PV today  ?Discussed with pt there will be an out-of-pocket cost for prep and that varies from $0 to 70 +  dollars - pt verbalized understanding  ?Pt instructed to use Singlecare.com or GoodRx for a price reduction on prep  ? ?PV completed over the phone. Pt verified name, DOB, address and insurance during PV today.  ?Pt mailed instruction packet with copy of consent form to read and not return, and instructions.  ?Pt encouraged to call with questions or issues.  ?If pt has My chart, procedure instructions sent via My Chart   ?

## 2021-07-21 ENCOUNTER — Telehealth: Payer: Medicare Other

## 2021-07-27 ENCOUNTER — Ambulatory Visit (AMBULATORY_SURGERY_CENTER): Payer: Medicare Other | Admitting: Gastroenterology

## 2021-07-27 ENCOUNTER — Encounter: Payer: Self-pay | Admitting: Gastroenterology

## 2021-07-27 VITALS — BP 146/81 | HR 70 | Temp 97.3°F | Resp 12 | Ht 76.0 in | Wt 235.0 lb

## 2021-07-27 DIAGNOSIS — K621 Rectal polyp: Secondary | ICD-10-CM | POA: Diagnosis not present

## 2021-07-27 DIAGNOSIS — Z1211 Encounter for screening for malignant neoplasm of colon: Secondary | ICD-10-CM

## 2021-07-27 DIAGNOSIS — D123 Benign neoplasm of transverse colon: Secondary | ICD-10-CM | POA: Diagnosis not present

## 2021-07-27 DIAGNOSIS — D128 Benign neoplasm of rectum: Secondary | ICD-10-CM

## 2021-07-27 MED ORDER — SODIUM CHLORIDE 0.9 % IV SOLN: 500.0000 mL | Freq: Once | INTRAVENOUS | Status: DC

## 2021-07-27 NOTE — Progress Notes (Signed)
Called to room to assist during endoscopic procedure.  Patient ID and intended procedure confirmed with present staff. Received instructions for my participation in the procedure from the performing physician.  

## 2021-07-27 NOTE — Progress Notes (Signed)
HPI: ?This is a man at routine risk for CRC ? ? ?ROS: complete GI ROS as described in HPI, all other review negative. ? ?Constitutional:  No unintentional weight loss ? ? ?Past Medical History:  ?Diagnosis Date  ? Allergy   ? seasonal  ? Angina   ? Anxiety   ? especially revolving around surgery & medical care   ? Cancer [facility])   ? 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 GERD (gastroesophageal reflux disease)   ? Heart murmur   ? Hemorrhoids   ? History of stress test 03/23/2011  ? told it was normal  ? Hyperlipidemia   ? Hypertension   ? Neuromuscular disorder ([place])   ? spinal stenosis   ? Shortness of breath   ? ? ?Past Surgical History:  ?Procedure Laterality Date  ? APPENDECTOMY    ? CERVICAL DISC SURGERY    ? COLONOSCOPY    ? EYE SURGERY  11/29/2009  ? x3- (total of 7 eye surgery)   ? LUMBAR LAMINECTOMY  11/20/1988  ? x2  ? MOHS SURGERY    ? 2022,updated 07/06/21  ? TESTICLE TORSION REDUCTION    ? UPPER GASTROINTESTINAL ENDOSCOPY    ? ? ?Current Outpatient Medications  ?Medication Sig Dispense Refill  ? aspirin EC 81 MG tablet Take 81 mg by mouth daily.    ? atenolol-chlorthalidone (TENORETIC) 50-25 MG tablet Take 1 tablet by mouth daily. 90 tablet 3  ? diphenhydrAMINE HCl, Sleep, (SLEEP AID) 50 MG CAPS Take 1 capsule by mouth at bedtime. As needed    ? doxazosin (CARDURA) 8 MG tablet Take 1 tablet (8 mg total) by mouth at bedtime. 90 tablet 3  ? ibuprofen (ADVIL) 200 MG tablet Take 600 mg by mouth daily as needed. Takes 2 times a week    ? omeprazole (PRILOSEC) 40 MG capsule Take 40 mg by mouth daily as needed. Taking 2-3 times weekly    ? potassium chloride SA (KLOR-CON M20) 20 MEQ tablet TAKE TWO TABLETS BY MOUTH TWICE A DAY 360 tablet 3  ? rosuvastatin (CRESTOR) 40 MG tablet Take 1 tablet (40 mg total) by mouth daily. 90 tablet 3  ? albuterol (VENTOLIN HFA) 108 (90 Base) MCG/ACT inhaler Inhale 2 puffs into the lungs every 2 (two) hours as needed for wheezing or shortness of breath (cough). 8 g 0   ? fluticasone (FLONASE) 50 MCG/ACT nasal spray Place 2 sprays into both nostrils daily. (Patient not taking: Reported on 07/06/2021) 9.9 g 0  ? ?Current Facility-Administered Medications  ?Medication Dose Route Frequency Provider Last Rate Last Admin  ? 0.9 %  sodium chloride infusion  500 mL Intravenous Once Milus Banister, MD      ? ? ?Allergies as of 07/27/2021 - Review Complete 07/27/2021  ?Allergen Reaction Noted  ? Hydrocodone Itching 04/26/2011  ? ? ?Family History  ?Problem Relation Age of Onset  ? Cancer Mother   ?     skin cancer/melanoma  ? Hypertension Father   ? Heart disease Father   ? Colon cancer Neg Hx   ? Colon polyps Neg Hx   ? Crohn's disease Neg Hx   ? Esophageal cancer Neg Hx   ? Rectal cancer Neg Hx   ? Stomach cancer Neg Hx   ? ? ?Social History  ? ?Socioeconomic History  ? Marital status: Married  ?  Spouse name: Not on file  ? Number of children: Not on file  ? Years of education: Not on  file  ? Highest education level: Not on file  ?Occupational History  ? Not on file  ?Tobacco Use  ? Smoking status: Never  ?  Passive exposure: Never  ? Smokeless tobacco: Never  ?Vaping Use  ? Vaping Use: Never used  ?Substance and Sexual Activity  ? Alcohol use: Yes  ?  Comment: 1 bottle wine a week, on weekends   ? Drug use: Not Currently  ?  Types: Marijuana  ?  Comment: recreationally- early August- 2016  ? Sexual activity: Yes  ?Other Topics Concern  ? Not on file  ?Social History Narrative  ? [employer] driver   ? No tobacco  ? No ets   ? Married   ? Wife works Scientist, research (life sciences)  ? ?Social Determinants of Health  ? ?Financial Resource Strain: Low Risk   ? Difficulty of Paying Living Expenses: Not hard at all  ?Food Insecurity: No Food Insecurity  ? Worried About Charity fundraiser in the Last Year: Never true  ? Ran Out of Food in the Last Year: Never true  ?Transportation Needs: No Transportation Needs  ? Lack of Transportation (Medical): No  ? Lack of Transportation (Non-Medical): No  ?Physical  Activity: Sufficiently Active  ? Days of Exercise per Week: 7 days  ? Minutes of Exercise per Session: 60 min  ?Stress: No Stress Concern Present  ? Feeling of Stress : Not at all  ?Social Connections: Socially Integrated  ? Frequency of Communication with Friends and Family: More than three times a week  ? Frequency of Social Gatherings with Friends and Family: More than three times a week  ? Attends Religious Services: 1 to 4 times per year  ? Active Member of Clubs or Organizations: Yes  ? Attends Archivist Meetings: 1 to 4 times per year  ? Marital Status: Married  ?Intimate Partner Violence: Not At Risk  ? Fear of Current or Ex-Partner: No  ? Emotionally Abused: No  ? Physically Abused: No  ? Sexually Abused: No  ? ? ? ?Physical Exam: ?BP (!) 144/86   Pulse 92   Temp (!) 97.3 ?F (36.3 ?C) (Temporal)   Ht '6\' 4"'$  (1.93 m)   Wt 235 lb (106.6 kg)   SpO2 96%   BMI 28.61 kg/m?  ?Constitutional: generally well-appearing ?Psychiatric: alert and oriented x3 ?Lungs: CTA bilaterally ?Heart: no MCR ? ?Assessment and plan: ?71 y.o. male with routine risk for CRC ? ?Screening colonoscopy today ? ?Care is appropriate for the ambulatory setting. ? ?Owens Loffler, MD ?[facility] ?07/27/2021, 10:16 AM ? ? ? ?

## 2021-07-27 NOTE — Patient Instructions (Signed)
Handout on polyps given to patient. Await pathology results. ?Resume previous diet and continue present medications. ?Repeat colonoscopy for surveillance will be determined based off of pathology results. ? ? ?YOU HAD AN ENDOSCOPIC PROCEDURE TODAY AT [facility]:   Refer to the procedure report that was given to you for any specific questions about what was found during the examination.  If the procedure report does not answer your questions, please call your gastroenterologist to clarify.  If you requested that your care partner not be given the details of your procedure findings, then the procedure report has been included in a sealed envelope for you to review at your convenience later. ? ?YOU SHOULD EXPECT: Some feelings of bloating in the abdomen. Passage of more gas than usual.  Walking can help get rid of the air that was put into your GI tract during the procedure and reduce the bloating. If you had a lower endoscopy (such as a colonoscopy or flexible sigmoidoscopy) you may notice spotting of blood in your stool or on the toilet paper. If you underwent a bowel prep for your procedure, you may not have a normal bowel movement for a few days. ? ?Please Note:  You might notice some irritation and congestion in your nose or some drainage.  This is from the oxygen used during your procedure.  There is no need for concern and it should clear up in a day or so. ? ?SYMPTOMS TO REPORT IMMEDIATELY: ? ?Following lower endoscopy (colonoscopy or flexible sigmoidoscopy): ? Excessive amounts of blood in the stool ? Significant tenderness or worsening of abdominal pains ? Swelling of the abdomen that is new, acute ? Fever of 100?F or higher ? ?For urgent or emergent issues, a gastroenterologist can be reached at any hour by calling [phone]. ?Do not use MyChart messaging for urgent concerns.  ? ? ?DIET:  We do recommend a small meal at first, but then you may proceed to your regular diet.  Drink  plenty of fluids but you should avoid alcoholic beverages for 24 hours. ? ?ACTIVITY:  You should plan to take it easy for the rest of today and you should NOT DRIVE or use heavy machinery until tomorrow (because of the sedation medicines used during the test).   ? ?FOLLOW UP: ?Our staff will call the number listed on your records 48-72 hours following your procedure to check on you and address any questions or concerns that you may have regarding the information given to you following your procedure. If we do not reach you, we will leave a message.  We will attempt to reach you two times.  During this call, we will ask if you have developed any symptoms of COVID 19. If you develop any symptoms (ie: fever, flu-like symptoms, shortness of breath, cough etc.) before then, please call [phone].  If you test positive for Covid 19 in the 2 weeks post procedure, please call and report this information to [place].   ? ?If any biopsies were taken you will be contacted by phone or by letter within the next 1-3 weeks.  Please call us at [phone] if you have not heard about the biopsies in 3 weeks.  ? ? ?SIGNATURES/CONFIDENTIALITY: ?You and/or your care partner have signed paperwork which will be entered into your electronic medical record.  These signatures attest to the fact that that the information above on your After Visit Summary has been reviewed and is understood.  Full responsibility of the confidentiality  of this discharge information lies with you and/or your care-partner.  ?

## 2021-07-27 NOTE — Progress Notes (Signed)
Pt's states no medical or surgical changes since previsit or office visit. 

## 2021-07-27 NOTE — Op Note (Signed)
[place] ?Patient Name: James [name] ?Procedure Date: 07/27/2021 10:17 AM ?MRN: [id number] ?Endoscopist: Milus Banister , MD ?Age: 71 ?Referring MD:  ?Date of Birth: 08-25-1950 ?Gender: Male ?Account #: [id number] ?Procedure:                Colonoscopy ?Indications:              Screening for colorectal malignant neoplasm ?Medicines:                Monitored Anesthesia Care ?Procedure:                Pre-Anesthesia Assessment: ?                          - Prior to the procedure, a History and Physical  ?                          was performed, and patient medications and  ?                          allergies were reviewed. The patient's tolerance of  ?                          previous anesthesia was also reviewed. The risks  ?                          and benefits of the procedure and the sedation  ?                          options and risks were discussed with the patient.  ?                          All questions were answered, and informed consent  ?                          was obtained. Prior Anticoagulants: The patient has  ?                          taken no previous anticoagulant or antiplatelet  ?                          agents. ASA Grade Assessment: II - A patient with  ?                          mild systemic disease. After reviewing the risks  ?                          and benefits, the patient was deemed in  ?                          satisfactory condition to undergo the procedure. ?                          After obtaining informed consent, the colonoscope  ?  was passed under direct vision. Throughout the  ?                          procedure, the patient's blood pressure, pulse, and  ?                          oxygen saturations were monitored continuously. The  ?                          CF [id number] #[id number] was introduced through the anus  ?                          and advanced to the the cecum, identified by  ?                          appendiceal orifice  and ileocecal valve. The  ?                          colonoscopy was performed without difficulty. The  ?                          patient tolerated the procedure well. The quality  ?                          of the bowel preparation was good. The ileocecal  ?                          valve, appendiceal orifice, and rectum were  ?                          photographed. ?Scope In: 10:25:12 AM ?Scope Out: 10:36:06 AM ?Scope Withdrawal Time: 0 hours 8 minutes 33 seconds  ?Total Procedure Duration: 0 hours 10 minutes 54 seconds  ?Findings:                 A 7 mm polyp was found in the transverse colon. The  ?                          polyp was sessile. The polyp was removed with a  ?                          cold snare. Resection and retrieval were complete. ?                          A 10 mm polyp was found in the mid rectum. The  ?                          polyp was pedunculated. The polyp was removed with  ?                          a hot snare. Resection and retrieval were complete. ?  The exam was otherwise without abnormality on  ?                          direct and retroflexion views. ?Complications:            No immediate complications. Estimated blood loss:  ?                          None. ?Estimated Blood Loss:     Estimated blood loss: none. ?Impression:               - One 7 mm polyp in the transverse colon, removed  ?                          with a cold snare. Resected and retrieved. ?                          - One 10 mm polyp in the mid rectum, removed with a  ?                          hot snare. Resected and retrieved. ?                          - The examination was otherwise normal on direct  ?                          and retroflexion views. ?Recommendation:           - Patient has a contact number available for  ?                          emergencies. The signs and symptoms of potential  ?                          delayed complications were discussed with the  ?                           patient. Return to normal activities tomorrow.  ?                          Written discharge instructions were provided to the  ?                          patient. ?                          - Resume previous diet. ?                          - Continue present medications. ?                          - Await pathology results. ?Milus Banister, MD ?07/27/2021 10:40:08 AM ?This report has been signed electronically. ?

## 2021-07-27 NOTE — Progress Notes (Signed)
Report to pacu rn; vss. ?

## 2021-07-29 ENCOUNTER — Telehealth: Payer: Self-pay | Admitting: *Deleted

## 2021-07-29 NOTE — Telephone Encounter (Signed)
?  Follow up Call- ? ? ?  07/27/2021  ?  9:39 AM  ?Call back number  ?Post procedure Call Back phone  # [phone]  ?Permission to leave phone message Yes  ?  ? ?Patient questions: ? ?Do you have a fever, pain , or abdominal swelling? No. ?Pain Score  0 * ? ?Have you tolerated food without any problems? Yes.   ? ?Have you been able to return to your normal activities? Yes.   ? ?Do you have any questions about your discharge instructions: ?Diet   No. ?Medications  No. ?Follow up visit  No. ? ?Do you have questions or concerns about your Care? No. ? ?Actions: ?* If pain score is 4 or above: ?No action needed, pain <4. ? ? ?

## 2021-08-04 ENCOUNTER — Encounter: Payer: Self-pay | Admitting: Gastroenterology

## 2021-12-07 ENCOUNTER — Encounter (INDEPENDENT_AMBULATORY_CARE_PROVIDER_SITE_OTHER): Payer: Self-pay | Admitting: Ophthalmology

## 2021-12-07 ENCOUNTER — Ambulatory Visit (INDEPENDENT_AMBULATORY_CARE_PROVIDER_SITE_OTHER): Payer: Medicare Other | Admitting: Ophthalmology

## 2021-12-07 DIAGNOSIS — H43812 Vitreous degeneration, left eye: Secondary | ICD-10-CM

## 2021-12-07 DIAGNOSIS — I6522 Occlusion and stenosis of left carotid artery: Secondary | ICD-10-CM

## 2021-12-07 DIAGNOSIS — H35372 Puckering of macula, left eye: Secondary | ICD-10-CM | POA: Diagnosis not present

## 2021-12-07 DIAGNOSIS — Z8669 Personal history of other diseases of the nervous system and sense organs: Secondary | ICD-10-CM | POA: Diagnosis not present

## 2021-12-07 DIAGNOSIS — H35371 Puckering of macula, right eye: Secondary | ICD-10-CM | POA: Diagnosis not present

## 2021-12-07 NOTE — Assessment & Plan Note (Signed)
Stable OU looks great no breaks no tears, good buckle OU

## 2021-12-07 NOTE — Assessment & Plan Note (Signed)
Physiologic OS stable

## 2021-12-07 NOTE — Progress Notes (Signed)
12/07/2021     CHIEF COMPLAINT Patient presents for  Chief Complaint  Patient presents with   Retina Evaluation      HISTORY OF PRESENT ILLNESS: James Bloom. is a 71 y.o. male who presents to the clinic today for:   HPI     Retina Evaluation           Laterality: both eyes         Comments   History of bilateral retinal detachment pair via scleral buckle OU some 12 years previous.  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      Referring physician: Binnie Rail, MD [place],  [place] [address]  HISTORICAL INFORMATION:   Selected notes from the MEDICAL RECORD NUMBER    Lab Results  Component Value Date   HGBA1C 6.0 04/17/2021     CURRENT MEDICATIONS: No current outpatient medications on file. (Ophthalmic Drugs)   No current facility-administered medications for this visit. (Ophthalmic Drugs)   Current Outpatient Medications (Other)  Medication Sig   albuterol (VENTOLIN HFA) 108 (90 Base) MCG/ACT inhaler Inhale 2 puffs into the lungs every 2 (two) hours as needed for wheezing or shortness of breath (cough).   aspirin EC 81 MG tablet Take 81 mg by mouth daily.   atenolol-chlorthalidone (TENORETIC) 50-25 MG tablet Take 1 tablet by mouth daily.   diphenhydrAMINE HCl, Sleep, (SLEEP AID) 50 MG CAPS Take 1 capsule by mouth at bedtime. As needed   doxazosin (CARDURA) 8 MG tablet Take 1 tablet (8 mg total) by mouth at bedtime.   fluticasone  (FLONASE) 50 MCG/ACT nasal spray Place 2 sprays into both nostrils daily. (Patient not taking: Reported on 07/06/2021)   ibuprofen (ADVIL) 200 MG tablet Take 600 mg by mouth daily as needed. Takes 2 times a week   omeprazole (PRILOSEC) 40 MG capsule Take 40 mg by mouth daily as needed. Taking 2-3 times weekly   potassium chloride SA (KLOR-CON M20) 20 MEQ tablet TAKE TWO TABLETS BY MOUTH TWICE A DAY   rosuvastatin (CRESTOR) 40 MG tablet Take 1 tablet (40 mg total) by mouth daily.   No current facility-administered medications for this visit. (Other)      REVIEW OF SYSTEMS: ROS   Negative for: Constitutional, Gastrointestinal, Neurological, Skin, Genitourinary, Musculoskeletal, HENT, Endocrine, Cardiovascular, Eyes, Respiratory, Psychiatric, Allergic/Imm, Heme/Lymph Last edited by Orene Desanctis D, CMA on 12/07/2021 12:59 PM.       ALLERGIES Allergies  Allergen Reactions   Hydrocodone Itching    PAST MEDICAL HISTORY Past Medical History:  Diagnosis Date   Allergy    seasonal   Angina    Anxiety    especially revolving around surgery & medical care    Cancer [facility])    forehead stage 1 melonoma,year ago updated 07/06/21   GERD (gastroesophageal reflux disease)    Heart murmur    Hemorrhoids    History of stress  test 03/23/2011   told it was normal   Hyperlipidemia    Hypertension    Neuromuscular disorder (HCC)    spinal stenosis    Shortness of breath    Past Surgical History:  Procedure Laterality Date   APPENDECTOMY     CERVICAL DISC SURGERY     COLONOSCOPY     EYE SURGERY  11/29/2009   x3- (total of 7 eye surgery)    LUMBAR LAMINECTOMY  11/20/1988   x2   MOHS SURGERY     2022,updated 07/06/21   TESTICLE TORSION REDUCTION     UPPER GASTROINTESTINAL ENDOSCOPY      FAMILY HISTORY Family History  Problem Relation Age of Onset   Cancer Mother        skin cancer/melanoma   Hypertension Father    Heart disease Father    Colon cancer Neg Hx    Colon polyps Neg  Hx    Crohn's disease Neg Hx    Esophageal cancer Neg Hx    Rectal cancer Neg Hx    Stomach cancer Neg Hx     SOCIAL HISTORY Social History   Tobacco Use   Smoking status: Never    Passive exposure: Never   Smokeless tobacco: Never  Vaping Use   Vaping Use: Never used  Substance Use Topics   Alcohol use: Yes    Comment: 1 bottle wine a week, on weekends    Drug use: Not Currently    Types: Marijuana    Comment: recreationally- early August- 2016         OPHTHALMIC EXAM:  Base Eye Exam     Visual Acuity (ETDRS)       Right Left   Dist cc  20/20 -1   Dist ph [place] 20/40 -1          Tonometry (Tonopen, 1:03 PM)       Right Left   Pressure 11 10         Pupils       Pupils   Right PERRL   Left PERRL         Visual Fields       Left Right    Full Full         Extraocular Movement       Right Left    Ortho Ortho    -- -- --  --  --  -- -- --   -- -- --  --  --  -- -- --           Neuro/Psych     Oriented x3: Yes   Mood/Affect: Normal         Dilation     Both eyes: 1.0% Mydriacyl, 2.5% Phenylephrine @ 1:00 PM           Slit Lamp and Fundus Exam     External Exam       Right Left   External Normal Normal         Slit Lamp Exam       Right Left   Lids/Lashes Normal Normal   Conjunctiva/Sclera White and quiet White and quiet   Cornea Clear Clear   Anterior Chamber Deep and quiet Deep and quiet   Iris Round and reactive Round and reactive   Lens Centered posterior chamber intraocular lens Centered posterior chamber intraocular lens   Anterior Vitreous Normal Normal         Fundus Exam       Right  Left   Posterior Vitreous clear, avitric Posterior vitreous detachment   Disc Normal Normal   C/D Ratio 0.4 0.4   Macula Normal Normal   Vessels Normal Normal   Periphery Good buckle, cryopexy attached Good buckle, cryopexy attached            IMAGING AND PROCEDURES  Imaging and Procedures for  12/07/21  OCT, Retina - OU - Both Eyes       Right Eye Quality was good. Scan locations included subfoveal. Central Foveal Thickness: 308. Progression has been stable. Findings include normal foveal contour, epiretinal membrane.   Left Eye Quality was good. Central Foveal Thickness: 299. Progression has been stable. Findings include normal foveal contour, epiretinal membrane.   Notes Minor epiretinal membrane nasal and somewhat superior to the fovea left eye with no distortion of the foveal elements, observe OS  OD minor epiretinal membrane certainly no topographic distortion continue to observe     Color Fundus Photography Optos - OU - Both Eyes       Right Eye Progression has been stable. Disc findings include normal observations. Macula : normal observations. Vessels : normal observations.   Left Eye Progression has been stable. Disc findings include normal observations. Macula : normal observations. Vessels : normal observations.   Notes Good peripheral retinopexy good buckle.  Clear media OD   OS clear media as wellGood peripheral cryopexy.              ASSESSMENT/PLAN:  Epiretinal membrane (ERM) of right eye Minor nondistorted observe  Left epiretinal membrane Minor no distortion of the fovea stable over time observe  Posterior vitreous detachment of left eye Physiologic OS stable  History of retinal detachment Stable OU looks great no breaks no tears, good buckle OU     ICD-10-CM   1. Left epiretinal membrane  H35.372 OCT, Retina - OU - Both Eyes    Color Fundus Photography Optos - OU - Both Eyes    2. Epiretinal membrane (ERM) of right eye  H35.371     3. Posterior vitreous detachment of left eye  H43.812     4. History of retinal detachment  Z86.69       1.  OU, history of retinal detachment stable OU  2.  OS with epiretinal membrane no distortion no progression over the last 2 years good acuity OU continue to monitor  3.  Ophthalmic  Meds Ordered this visit:  No orders of the defined types were placed in this encounter.      Return in about 2 years (around 12/08/2023) for DILATE OU, OCT, COLOR FP.  There are no Patient Instructions on file for this visit.   Explained the diagnoses, plan, and follow up with the patient and they expressed understanding.  Patient expressed understanding of the importance of proper follow up care.   Clent Demark Cully Luckow M.D. Diseases & Surgery of the Retina and Vitreous Retina [facility] Diabetic [place] 12/07/21     Abbreviations: M myopia (nearsighted); A astigmatism; H hyperopia (farsighted); P presbyopia; Mrx spectacle prescription;  CTL contact lenses; OD right eye; OS left eye; OU both eyes  XT exotropia; ET esotropia; PEK punctate epithelial keratitis; PEE punctate epithelial erosions; DES dry eye syndrome; MGD meibomian gland dysfunction; ATs artificial tears; PFAT's preservative free artificial tears; [place] nuclear sclerotic cataract; PSC posterior subcapsular cataract; ERM epi-retinal membrane; PVD posterior vitreous detachment; RD retinal detachment; DM diabetes mellitus; DR diabetic retinopathy; NPDR non-proliferative diabetic retinopathy; PDR proliferative diabetic retinopathy; CSME clinically  significant macular edema; DME diabetic macular edema; dbh dot blot hemorrhages; CWS cotton wool spot; POAG primary open angle glaucoma; C/D cup-to-disc ratio; HVF humphrey visual field; GVF goldmann visual field; OCT optical coherence tomography; IOP intraocular pressure; BRVO Branch retinal vein occlusion; CRVO central retinal vein occlusion; CRAO central retinal artery occlusion; BRAO branch retinal artery occlusion; RT retinal tear; SB scleral buckle; PPV pars plana vitrectomy; VH Vitreous hemorrhage; PRP panretinal laser photocoagulation; IVK intravitreal kenalog; VMT vitreomacular traction; MH Macular hole;  NVD neovascularization of the disc; NVE neovascularization elsewhere; AREDS age related eye  disease study; ARMD age related macular degeneration; POAG primary open angle glaucoma; EBMD epithelial/anterior basement membrane dystrophy; ACIOL anterior chamber intraocular lens; IOL intraocular lens; PCIOL posterior chamber intraocular lens; Phaco/IOL phacoemulsification with intraocular lens placement; [place] photorefractive keratectomy; LASIK laser assisted in situ keratomileusis; HTN hypertension; DM diabetes mellitus; COPD chronic obstructive pulmonary disease

## 2021-12-07 NOTE — Assessment & Plan Note (Signed)
Minor no distortion of the fovea stable over time observe

## 2021-12-07 NOTE — Assessment & Plan Note (Signed)
Minor nondistorted observe

## 2021-12-24 DIAGNOSIS — Z23 Encounter for immunization: Secondary | ICD-10-CM | POA: Diagnosis not present

## 2022-01-19 DIAGNOSIS — Z86006 Personal history of melanoma in-situ: Secondary | ICD-10-CM | POA: Diagnosis not present

## 2022-01-19 DIAGNOSIS — L578 Other skin changes due to chronic exposure to nonionizing radiation: Secondary | ICD-10-CM | POA: Diagnosis not present

## 2022-01-19 DIAGNOSIS — L814 Other melanin hyperpigmentation: Secondary | ICD-10-CM | POA: Diagnosis not present

## 2022-01-19 DIAGNOSIS — L570 Actinic keratosis: Secondary | ICD-10-CM | POA: Diagnosis not present

## 2022-01-19 DIAGNOSIS — D225 Melanocytic nevi of trunk: Secondary | ICD-10-CM | POA: Diagnosis not present

## 2022-01-19 DIAGNOSIS — Z85820 Personal history of malignant melanoma of skin: Secondary | ICD-10-CM | POA: Diagnosis not present

## 2022-01-19 DIAGNOSIS — D2272 Melanocytic nevi of left lower limb, including hip: Secondary | ICD-10-CM | POA: Diagnosis not present

## 2022-01-19 DIAGNOSIS — L821 Other seborrheic keratosis: Secondary | ICD-10-CM | POA: Diagnosis not present

## 2022-01-19 DIAGNOSIS — D2262 Melanocytic nevi of left upper limb, including shoulder: Secondary | ICD-10-CM | POA: Diagnosis not present

## 2022-04-07 ENCOUNTER — Telehealth: Payer: Self-pay | Admitting: Internal Medicine

## 2022-04-07 NOTE — Telephone Encounter (Signed)
Left message for patient to call back to schedule Medicare Annual Wellness Visit   Last AWV  04/28/21  Please schedule at anytime with [facility] if patient calls the office back.    30 Minutes appointment   Any questions, please call me at [phone]

## 2022-04-13 ENCOUNTER — Other Ambulatory Visit: Payer: Self-pay | Admitting: Internal Medicine

## 2022-04-13 ENCOUNTER — Telehealth: Payer: Self-pay | Admitting: Internal Medicine

## 2022-04-13 DIAGNOSIS — R7303 Prediabetes: Secondary | ICD-10-CM

## 2022-04-13 DIAGNOSIS — E782 Mixed hyperlipidemia: Secondary | ICD-10-CM

## 2022-04-13 DIAGNOSIS — I10 Essential (primary) hypertension: Secondary | ICD-10-CM

## 2022-04-13 DIAGNOSIS — Z125 Encounter for screening for malignant neoplasm of prostate: Secondary | ICD-10-CM

## 2022-04-13 DIAGNOSIS — E876 Hypokalemia: Secondary | ICD-10-CM

## 2022-04-13 NOTE — Telephone Encounter (Signed)
Patient called and would like lab orders to be put in so he can do his lab work before his appointment with Dr. Quay Burow. He states he is free any morning next week. He would like a call when labs orders have been put in, good callback is [phone].

## 2022-04-14 NOTE — Telephone Encounter (Signed)
ordered

## 2022-04-15 ENCOUNTER — Other Ambulatory Visit: Payer: Self-pay | Admitting: Internal Medicine

## 2022-04-15 DIAGNOSIS — E782 Mixed hyperlipidemia: Secondary | ICD-10-CM

## 2022-04-15 NOTE — Telephone Encounter (Signed)
Spoke with patient and lab appointment made.

## 2022-04-16 ENCOUNTER — Other Ambulatory Visit (INDEPENDENT_AMBULATORY_CARE_PROVIDER_SITE_OTHER): Payer: Medicare Other

## 2022-04-16 DIAGNOSIS — I10 Essential (primary) hypertension: Secondary | ICD-10-CM

## 2022-04-16 DIAGNOSIS — R7303 Prediabetes: Secondary | ICD-10-CM | POA: Diagnosis not present

## 2022-04-16 DIAGNOSIS — E876 Hypokalemia: Secondary | ICD-10-CM | POA: Diagnosis not present

## 2022-04-16 DIAGNOSIS — E782 Mixed hyperlipidemia: Secondary | ICD-10-CM | POA: Diagnosis not present

## 2022-04-16 DIAGNOSIS — Z125 Encounter for screening for malignant neoplasm of prostate: Secondary | ICD-10-CM

## 2022-04-16 LAB — CBC WITH DIFFERENTIAL/PLATELET
Basophils Absolute: 0 10*3/uL (ref 0.0–0.1)
Basophils Relative: 0.6 % (ref 0.0–3.0)
Eosinophils Absolute: 0.6 10*3/uL (ref 0.0–0.7)
Eosinophils Relative: 9 % — ABNORMAL HIGH (ref 0.0–5.0)
HCT: 43.8 % (ref 39.0–52.0)
Hemoglobin: 15.3 g/dL (ref 13.0–17.0)
Lymphocytes Relative: 21.5 % (ref 12.0–46.0)
Lymphs Abs: 1.4 10*3/uL (ref 0.7–4.0)
MCHC: 34.9 g/dL (ref 30.0–36.0)
MCV: 86.1 fl (ref 78.0–100.0)
Monocytes Absolute: 0.5 10*3/uL (ref 0.1–1.0)
Monocytes Relative: 7.2 % (ref 3.0–12.0)
Neutro Abs: 4.1 10*3/uL (ref 1.4–7.7)
Neutrophils Relative %: 61.7 % (ref 43.0–77.0)
Platelets: 252 10*3/uL (ref 150.0–400.0)
RBC: 5.09 Mil/uL (ref 4.22–5.81)
RDW: 13.7 % (ref 11.5–15.5)
WBC: 6.6 10*3/uL (ref 4.0–10.5)

## 2022-04-16 LAB — COMPREHENSIVE METABOLIC PANEL
ALT: 14 U/L (ref 0–53)
AST: 17 U/L (ref 0–37)
Albumin: 4.2 g/dL (ref 3.5–5.2)
Alkaline Phosphatase: 58 U/L (ref 39–117)
BUN: 20 mg/dL (ref 6–23)
CO2: 31 mEq/L (ref 19–32)
Calcium: 9.1 mg/dL (ref 8.4–10.5)
Chloride: 96 mEq/L (ref 96–112)
Creatinine, Ser: 1.13 mg/dL (ref 0.40–1.50)
GFR: 66.44 mL/min (ref >60.00)
Glucose, Bld: 103 mg/dL — ABNORMAL HIGH (ref 70–99)
Potassium: 3.3 mEq/L — ABNORMAL LOW (ref 3.5–5.1)
Sodium: 138 mEq/L (ref 135–145)
Total Bilirubin: 0.8 mg/dL (ref 0.2–1.2)
Total Protein: 6.6 g/dL (ref 6.0–8.3)

## 2022-04-16 LAB — PSA, MEDICARE: PSA: 2.13 ng/ml (ref 0.10–4.00)

## 2022-04-16 LAB — TSH: TSH: 1.97 u[IU]/mL (ref 0.35–5.50)

## 2022-04-16 LAB — LIPID PANEL
Cholesterol: 128 mg/dL (ref 0–200)
HDL: 45.3 mg/dL (ref >39.00)
LDL Cholesterol: 62 mg/dL (ref 0–99)
NonHDL: 82.87
Total CHOL/HDL Ratio: 3
Triglycerides: 103 mg/dL (ref 0.0–149.0)
VLDL: 20.6 mg/dL (ref 0.0–40.0)

## 2022-04-16 LAB — HEMOGLOBIN A1C: Hgb A1c MFr Bld: 6.2 % (ref 4.6–6.5)

## 2022-04-19 ENCOUNTER — Other Ambulatory Visit: Payer: Self-pay | Admitting: *Deleted

## 2022-04-19 DIAGNOSIS — I6523 Occlusion and stenosis of bilateral carotid arteries: Secondary | ICD-10-CM

## 2022-04-25 ENCOUNTER — Encounter: Payer: Self-pay | Admitting: Internal Medicine

## 2022-04-25 NOTE — Patient Instructions (Addendum)
      Blood work was ordered.   The lab is on the first floor.    Medications changes include :       A referral was ordered for XXX.     Someone will call you to schedule an appointment.    Return in about 1 year (around 04/27/2023) for follow up.

## 2022-04-25 NOTE — Progress Notes (Unsigned)
Subjective:    Patient ID: James Meeker., male    DOB: [birth year], 72 y.o.   MRN: [id number]     HPI James [name] is here for follow up of his chronic medical problems, including htn, GERD, hld, hypokalemia, prediabetes   GERD- he did decrease his omeprazole.  He noticed his teeth were rotting away.  His dentist told him it was related to the acid from the GERD.  He is back to taking it 5/week  Decreased hearing right ear.  No pain.  It feels clogged.  Occasionally it pops.    Walking 2 miles daily.    Medications and allergies reviewed with patient and updated if appropriate.  Current Outpatient Medications on File Prior to Visit  Medication Sig Dispense Refill   albuterol (VENTOLIN HFA) 108 (90 Base) MCG/ACT inhaler Inhale 2 puffs into the lungs every 2 (two) hours as needed for wheezing or shortness of breath (cough). 8 g 0   aspirin EC 81 MG tablet Take 81 mg by mouth daily.     atenolol-chlorthalidone (TENORETIC) 50-25 MG tablet TAKE ONE TABLET BY MOUTH DAILY 90 tablet 3   diphenhydrAMINE HCl, Sleep, (SLEEP AID) 50 MG CAPS Take 1 capsule by mouth at bedtime. As needed     doxazosin (CARDURA) 8 MG tablet TAKE ONE TABLET BY MOUTH EVERY NIGHT AT BEDTIME 90 tablet 3   fluticasone (FLONASE) 50 MCG/ACT nasal spray Place 2 sprays into both nostrils daily. 9.9 g 0   ibuprofen (ADVIL) 200 MG tablet Take 600 mg by mouth daily as needed. Takes 2 times a week     omeprazole (PRILOSEC) 40 MG capsule Take 40 mg by mouth daily as needed. Taking 2-3 times weekly     potassium chloride SA (KLOR-CON M20) 20 MEQ tablet TAKE TWO TABLETS BY MOUTH TWICE A DAY 360 tablet 3   rosuvastatin (CRESTOR) 40 MG tablet TAKE ONE TABLET BY MOUTH DAILY 90 tablet 3   No current facility-administered medications on file prior to visit.     Review of Systems  Constitutional:  Negative for chills and fever.  HENT:  Positive for hearing loss (right ear). Negative for ear pain and trouble swallowing.   Eyes:   Negative for visual disturbance.  Respiratory:  Positive for cough (first thing in the morning). Negative for shortness of breath and wheezing.   Cardiovascular:  Negative for chest pain, palpitations and leg swelling.  Gastrointestinal:  Negative for abdominal pain, constipation, diarrhea and nausea.  Musculoskeletal:  Positive for arthralgias.  Neurological:  Negative for light-headedness and headaches.       Objective:   Vitals:   04/26/22 1307  BP: 122/72  Pulse: (!) 59  Temp: 99.5 F (37.5 C)  SpO2: 96%   BP Readings from Last 3 Encounters:  04/26/22 122/72  07/27/21 (!) 146/81  04/23/21 128/78   Wt Readings from Last 3 Encounters:  04/26/22 243 lb (110.2 kg)  07/27/21 235 lb (106.6 kg)  07/06/21 235 lb (106.6 kg)   Body mass index is 29.58 kg/m.    Physical Exam Constitutional:      General: He is not in acute distress.    Appearance: Normal appearance. He is not ill-appearing.  HENT:     Head: Normocephalic and atraumatic.     Right Ear: Tympanic membrane, ear canal and external ear normal. There is no impacted cerumen.     Left Ear: Tympanic membrane, ear canal and external ear normal. There is no impacted cerumen.  Eyes:     Conjunctiva/sclera: Conjunctivae normal.  Cardiovascular:     Rate and Rhythm: Normal rate and regular rhythm.     Heart sounds: Normal heart sounds. No murmur heard. Pulmonary:     Effort: Pulmonary effort is normal. No respiratory distress.     Breath sounds: Normal breath sounds. No wheezing or rales.  Abdominal:     General: There is no distension.     Palpations: Abdomen is soft.     Tenderness: There is no abdominal tenderness. There is no guarding or rebound.  Musculoskeletal:     Cervical back: Neck supple. No tenderness.     Right lower leg: No edema.     Left lower leg: No edema.  Lymphadenopathy:     Cervical: No cervical adenopathy.  Skin:    General: Skin is warm and dry.     Findings: No rash.  Neurological:      Mental Status: He is alert. Mental status is at baseline.  Psychiatric:        Mood and Affect: Mood normal.        Lab Results  Component Value Date   WBC 6.6 04/16/2022   HGB 15.3 04/16/2022   HCT 43.8 04/16/2022   PLT 252.0 04/16/2022   GLUCOSE 103 (H) 04/16/2022   CHOL 128 04/16/2022   TRIG 103.0 04/16/2022   HDL 45.30 04/16/2022   LDLCALC 62 04/16/2022   ALT 14 04/16/2022   AST 17 04/16/2022   NA 138 04/16/2022   K 3.3 (L) 04/16/2022   CL 96 04/16/2022   CREATININE 1.13 04/16/2022   BUN 20 04/16/2022   CO2 31 04/16/2022   TSH 1.97 04/16/2022   PSA 2.13 04/16/2022   INR 0.99 05/28/2011   HGBA1C 6.2 04/16/2022     Assessment & Plan:    See Problem List for Assessment and Plan of chronic medical problems.

## 2022-04-26 ENCOUNTER — Ambulatory Visit (INDEPENDENT_AMBULATORY_CARE_PROVIDER_SITE_OTHER): Payer: Medicare Other | Admitting: Internal Medicine

## 2022-04-26 ENCOUNTER — Encounter: Payer: Self-pay | Admitting: Internal Medicine

## 2022-04-26 VITALS — BP 122/72 | HR 59 | Temp 99.5°F | Ht 76.0 in | Wt 243.0 lb

## 2022-04-26 DIAGNOSIS — H9191 Unspecified hearing loss, right ear: Secondary | ICD-10-CM | POA: Insufficient documentation

## 2022-04-26 DIAGNOSIS — Z125 Encounter for screening for malignant neoplasm of prostate: Secondary | ICD-10-CM | POA: Diagnosis not present

## 2022-04-26 DIAGNOSIS — R7303 Prediabetes: Secondary | ICD-10-CM | POA: Diagnosis not present

## 2022-04-26 DIAGNOSIS — E782 Mixed hyperlipidemia: Secondary | ICD-10-CM | POA: Diagnosis not present

## 2022-04-26 DIAGNOSIS — E876 Hypokalemia: Secondary | ICD-10-CM | POA: Diagnosis not present

## 2022-04-26 DIAGNOSIS — Z0000 Encounter for general adult medical examination without abnormal findings: Secondary | ICD-10-CM

## 2022-04-26 DIAGNOSIS — K219 Gastro-esophageal reflux disease without esophagitis: Secondary | ICD-10-CM | POA: Diagnosis not present

## 2022-04-26 DIAGNOSIS — I10 Essential (primary) hypertension: Secondary | ICD-10-CM | POA: Diagnosis not present

## 2022-04-26 DIAGNOSIS — I6523 Occlusion and stenosis of bilateral carotid arteries: Secondary | ICD-10-CM | POA: Diagnosis not present

## 2022-04-26 MED ORDER — OMEPRAZOLE 40 MG PO CPDR: 40.0000 mg | DELAYED_RELEASE_CAPSULE | Freq: Every day | ORAL | 3 refills | Status: DC

## 2022-04-26 NOTE — Assessment & Plan Note (Signed)
Chronic Regular exercise and healthy diet encouraged Check lipid panel  Continue Crestor 40 mg daily 

## 2022-04-26 NOTE — Assessment & Plan Note (Signed)
New Having decreased hearing in his right ear-feels clogged Exam is normal-no wax, no erythema ETD versus hearing loss Can try Flonase daily to see if that helps Referral to ENT

## 2022-04-26 NOTE — Assessment & Plan Note (Addendum)
Chronic Secondary to chlorthalidone Testing was slightly low on the blood work, but typically a little bit better Can consider discontinuing chlorthalidone, blood pressure is well-controlled Continue potassium chloride 80 mill equivalents daily

## 2022-04-26 NOTE — Assessment & Plan Note (Addendum)
Chronic GERD controlled Continue omeprazole 40 mg daily - taking 5 days a week-okay to increase to daily if needed to control his GERD Discussed that he can also add Pepcid nightly for the next couple of weeks to see if that helps his morning cough which could be from GERD

## 2022-04-26 NOTE — Assessment & Plan Note (Addendum)
Chronic Blood pressure well controlled Reviewed CMP Continue atenolol-chlorthalidone 50-25 mg daily

## 2022-04-26 NOTE — Assessment & Plan Note (Signed)
Chronic Check a1c-currently 6.2%, which is higher than it has been Low sugar / carb diet Stressed regular exercise

## 2022-04-29 ENCOUNTER — Ambulatory Visit (INDEPENDENT_AMBULATORY_CARE_PROVIDER_SITE_OTHER): Payer: Medicare Other | Admitting: Physician Assistant

## 2022-04-29 ENCOUNTER — Ambulatory Visit (HOSPITAL_COMMUNITY)
Admission: RE | Admit: 2022-04-29 | Discharge: 2022-04-29 | Disposition: A | Discharge status: Home / Self Care | Payer: Medicare Other | Source: Ambulatory Visit | Attending: Vascular Surgery | Admitting: Vascular Surgery

## 2022-04-29 VITALS — BP 148/85 | HR 67 | Temp 97.8°F | Ht 76.0 in | Wt 240.0 lb

## 2022-04-29 DIAGNOSIS — I6523 Occlusion and stenosis of bilateral carotid arteries: Secondary | ICD-10-CM

## 2022-04-29 NOTE — Progress Notes (Signed)
Established Carotid Patient   History of Present Illness   James [name] is a 72 y.o. (1951/03/05) male who presents for surveillance of carotid artery stenosis.  He has a history of bilateral carotid stenosis, last measured at 40 to 59% on the left and 1 to 39% on the right.  He has no previous history of stroke.  At follow-up today, he is doing well.  He denies any strokelike symptoms such as sudden onset of weakness/numbness, sudden vision changes, slurred speech, disorientation.  He denies any other changes to his health.  He still tries to stay active and walk a few miles every day.  He is medically managed on aspirin and statin.  Current Outpatient Medications  Medication Sig Dispense Refill   albuterol (VENTOLIN HFA) 108 (90 Base) MCG/ACT inhaler Inhale 2 puffs into the lungs every 2 (two) hours as needed for wheezing or shortness of breath (cough). 8 g 0   aspirin EC 81 MG tablet Take 81 mg by mouth daily.     atenolol-chlorthalidone (TENORETIC) 50-25 MG tablet TAKE ONE TABLET BY MOUTH DAILY 90 tablet 3   diphenhydrAMINE HCl, Sleep, (SLEEP AID) 50 MG CAPS Take 1 capsule by mouth at bedtime. As needed     doxazosin (CARDURA) 8 MG tablet TAKE ONE TABLET BY MOUTH EVERY NIGHT AT BEDTIME 90 tablet 3   fluticasone (FLONASE) 50 MCG/ACT nasal spray Place 2 sprays into both nostrils daily. 9.9 g 0   omeprazole (PRILOSEC) 40 MG capsule Take 1 capsule (40 mg total) by mouth daily. Taking 2-3 times weekly 90 capsule 3   potassium chloride SA (KLOR-CON M20) 20 MEQ tablet TAKE TWO TABLETS BY MOUTH TWICE A DAY 360 tablet 3   rosuvastatin (CRESTOR) 40 MG tablet TAKE ONE TABLET BY MOUTH DAILY 90 tablet 3   No current facility-administered medications for this visit.    REVIEW OF SYSTEMS (negative unless checked):   Cardiac:  '[]'$  Chest pain or chest pressure? '[]'$  Shortness of breath upon activity? '[]'$  Shortness of breath when lying flat? '[]'$  Irregular heart rhythm?  Vascular:  '[]'$  Pain in  calf, thigh, or hip brought on by walking? '[]'$  Pain in feet at night that wakes you up from your sleep? '[]'$  Blood clot in your veins? '[]'$  Leg swelling?  Pulmonary:  '[]'$  Oxygen at home? '[]'$  Productive cough? '[]'$  Wheezing?  Neurologic:  '[]'$  Sudden weakness in arms or legs? '[]'$  Sudden numbness in arms or legs? '[]'$  Sudden onset of difficult speaking or slurred speech? '[]'$  Temporary loss of vision in one eye? '[]'$  Problems with dizziness?  Gastrointestinal:  '[]'$  Blood in stool? '[]'$  Vomited blood?  Genitourinary:  '[]'$  Burning when urinating? '[]'$  Blood in urine?  Psychiatric:  '[]'$  Major depression  Hematologic:  '[]'$  Bleeding problems? '[]'$  Problems with blood clotting?  Dermatologic:  '[]'$  Rashes or ulcers?  Constitutional:  '[]'$  Fever or chills?  Ear/Nose/Throat:  '[]'$  Change in hearing? '[]'$  Nose bleeds? '[]'$  Sore throat?  Musculoskeletal:  '[]'$  Back pain? '[]'$  Joint pain? '[]'$  Muscle pain?   Physical Examination   Vitals:   04/29/22 1215 04/29/22 1216  BP: 131/87 (!) 148/85  Pulse: 67   Temp: 97.8 F (36.6 C)   TempSrc: Temporal   SpO2: 96%   Weight: 240 lb (108.9 kg)   Height: '6\' 4"'$  (1.93 m)    Body mass index is 29.21 kg/m.  General:  WDWN in NAD; vital signs documented above Gait: Not observed HENT: WNL, normocephalic Pulmonary: normal non-labored breathing Cardiac:  regular HR, without  Murmurs without carotid bruit Abdomen: soft, NT, no masses Skin: without rashes Vascular Exam/Pulses: 2+ radial pulses Extremities: without ischemic changes, without Gangrene , without cellulitis; without open wounds;  Musculoskeletal: no muscle wasting or atrophy  Neurologic: A&O X 3;  No focal weakness or paresthesias are detected Psychiatric:  The pt has Normal affect.  Non-Invasive Vascular Imaging   B Carotid Duplex (04/29/2022):  R ICA stenosis:  1-39% R VA:  patent and antegrade L ICA stenosis:  40-59% L VA:  patent and antegrade   Medical Decision Making   James Eid. is a  72 y.o. male who presents for carotid artery stenosis surveillance  Based on the patient's vascular studies, his bilateral carotid artery stenosis is unchanged since his last visit in 2022.  His right ICA stenosis is at 1 to 39%.  His left ICA stenosis is at 40 to 59%. He denies any TIA or strokelike symptoms.  He denies any rest pain or claudication. He still stays very active and walks a few miles every day.  He is medically managed on aspirin and statin. He can follow-up with our office in 1 year with repeat studies   Vicente Serene PA-C [facility]: [phone]  Call MD: James [name]

## 2022-07-20 DIAGNOSIS — H65491 Other chronic nonsuppurative otitis media, right ear: Secondary | ICD-10-CM | POA: Diagnosis not present

## 2022-07-20 DIAGNOSIS — H90A22 Sensorineural hearing loss, unilateral, left ear, with restricted hearing on the contralateral side: Secondary | ICD-10-CM | POA: Diagnosis not present

## 2022-07-20 DIAGNOSIS — H90A31 Mixed conductive and sensorineural hearing loss, unilateral, right ear with restricted hearing on the contralateral side: Secondary | ICD-10-CM | POA: Diagnosis not present

## 2022-07-20 DIAGNOSIS — J392 Other diseases of pharynx: Secondary | ICD-10-CM | POA: Diagnosis not present

## 2022-07-26 DIAGNOSIS — H65491 Other chronic nonsuppurative otitis media, right ear: Secondary | ICD-10-CM | POA: Diagnosis not present

## 2022-07-28 DIAGNOSIS — L578 Other skin changes due to chronic exposure to nonionizing radiation: Secondary | ICD-10-CM | POA: Diagnosis not present

## 2022-07-28 DIAGNOSIS — L814 Other melanin hyperpigmentation: Secondary | ICD-10-CM | POA: Diagnosis not present

## 2022-07-28 DIAGNOSIS — D2272 Melanocytic nevi of left lower limb, including hip: Secondary | ICD-10-CM | POA: Diagnosis not present

## 2022-07-28 DIAGNOSIS — Z85820 Personal history of malignant melanoma of skin: Secondary | ICD-10-CM | POA: Diagnosis not present

## 2022-07-28 DIAGNOSIS — D225 Melanocytic nevi of trunk: Secondary | ICD-10-CM | POA: Diagnosis not present

## 2022-07-28 DIAGNOSIS — Z86006 Personal history of melanoma in-situ: Secondary | ICD-10-CM | POA: Diagnosis not present

## 2022-07-28 DIAGNOSIS — D2262 Melanocytic nevi of left upper limb, including shoulder: Secondary | ICD-10-CM | POA: Diagnosis not present

## 2022-07-28 DIAGNOSIS — L821 Other seborrheic keratosis: Secondary | ICD-10-CM | POA: Diagnosis not present

## 2022-08-02 ENCOUNTER — Ambulatory Visit (INDEPENDENT_AMBULATORY_CARE_PROVIDER_SITE_OTHER): Payer: Medicare Other

## 2022-08-02 DIAGNOSIS — Z0000 Encounter for general adult medical examination without abnormal findings: Secondary | ICD-10-CM | POA: Diagnosis not present

## 2022-08-02 NOTE — Progress Notes (Signed)
Subjective:   James [name] is a 72 y.o. male who presents for Medicare Annual/Subsequent preventive examination.  I connected with James [name] today by telephone and verified that I am speaking with the correct person using two identifiers. I discussed the limitations, risks, security and privacy concerns of performing an evaluation and management service by telephone and the availability of in person appointments. I also discussed with the patient that there may be a patient responsible charge related to this service. The patient expressed understanding and agreed to proceed. Location patient: home Location provider: Kyra Searles Persons participating in the visit: James [name] and James [name], CMA.  Time Spent with patient on telephone encounter: 10 mins  Review of Systems    No ROS. Medicare Wellness Telephone Visit. Additional risk factors are reflected in social history. Cardiac Risk Factors include: advanced age (>53men, >76 women);dyslipidemia;male gender;hypertension     Objective:    There were no vitals filed for this visit. There is no height or weight on file to calculate BMI.     08/02/2022   10:04 AM 04/28/2021    2:25 PM 04/18/2020    8:35 AM 12/11/2014   10:09 AM 11/04/2014    2:04 PM 05/27/2011    7:09 PM  Advanced Directives  Does Patient Have a Medical Advance Directive? Yes Yes Yes Yes Yes Patient has advance directive, copy not in chart  Type of Advance Directive Healthcare Power of [place];Living will Living will;Healthcare Power of Attorney Living will;Healthcare Power of [employer] Power of [place];Living will Living will   Does patient want to make changes to medical advance directive? No - Patient declined No - Patient declined No - Patient declined No - Patient declined No - Patient declined   Copy of Healthcare Power of Attorney in Chart? No - copy requested No - copy requested No - copy requested No - copy requested      Current Medications  (verified) Outpatient Encounter Medications as of 08/02/2022  Medication Sig   albuterol (VENTOLIN HFA) 108 (90 Base) MCG/ACT inhaler Inhale 2 puffs into the lungs every 2 (two) hours as needed for wheezing or shortness of breath (cough).   aspirin EC 81 MG tablet Take 81 mg by mouth daily.   atenolol-chlorthalidone (TENORETIC) 50-25 MG tablet TAKE ONE TABLET BY MOUTH DAILY   diphenhydrAMINE HCl, Sleep, (SLEEP AID) 50 MG CAPS Take 1 capsule by mouth at bedtime. As needed   doxazosin (CARDURA) 8 MG tablet TAKE ONE TABLET BY MOUTH EVERY NIGHT AT BEDTIME   fluticasone (FLONASE) 50 MCG/ACT nasal spray Place 2 sprays into both nostrils daily.   omeprazole (PRILOSEC) 40 MG capsule Take 1 capsule (40 mg total) by mouth daily. Taking 2-3 times weekly   potassium chloride SA (KLOR-CON M20) 20 MEQ tablet TAKE TWO TABLETS BY MOUTH TWICE A DAY   rosuvastatin (CRESTOR) 40 MG tablet TAKE ONE TABLET BY MOUTH DAILY   No facility-administered encounter medications on file as of 08/02/2022.    Allergies (verified) Hydrocodone   History: Past Medical History:  Diagnosis Date   Allergy    seasonal   Angina    Anxiety    especially revolving around surgery & medical care    Cancer [facility])    forehead stage 1 melonoma,year ago updated 07/06/21   GERD (gastroesophageal reflux disease)    Heart murmur    Hemorrhoids    History of stress test 03/23/2011   told it was normal   Hyperlipidemia    Hypertension  Neuromuscular disorder (HCC)    spinal stenosis    Shortness of breath    Past Surgical History:  Procedure Laterality Date   APPENDECTOMY     CERVICAL DISC SURGERY     COLONOSCOPY     EYE SURGERY  11/29/2009   x3- (total of 7 eye surgery)    LUMBAR LAMINECTOMY  11/20/1988   x2   MOHS SURGERY     2022,updated 07/06/21   TESTICLE TORSION REDUCTION     UPPER GASTROINTESTINAL ENDOSCOPY     Family History  Problem Relation Age of Onset   Cancer Mother        skin cancer/melanoma    Hypertension Father    Heart disease Father    Colon cancer Neg Hx    Colon polyps Neg Hx    Crohn's disease Neg Hx    Esophageal cancer Neg Hx    Rectal cancer Neg Hx    Stomach cancer Neg Hx    Social History   Socioeconomic History   Marital status: Married    Spouse name: Not on file   Number of children: Not on file   Years of education: Not on file   Highest education level: Not on file  Occupational History   Not on file  Tobacco Use   Smoking status: Never    Passive exposure: Never   Smokeless tobacco: Never  Vaping Use   Vaping Use: Never used  Substance and Sexual Activity   Alcohol use: Yes    Comment: 1 bottle wine a week, on weekends    Drug use: Not Currently    Types: Marijuana    Comment: recreationally- early August- 2016   Sexual activity: Yes  Other Topics Concern   Not on file  Social History Narrative   [employer] driver    No tobacco   No ets    Married    Wife works Mining engineer   Social Determinants of Health   Financial Resource Strain: Low Risk  (08/02/2022)   Overall Financial Resource Strain (CARDIA)    Difficulty of Paying Living Expenses: Not hard at all  Food Insecurity: No Food Insecurity (08/02/2022)   Hunger Vital Sign    Worried About Running Out of Food in the Last Year: Never true    Ran Out of Food in the Last Year: Never true  Transportation Needs: No Transportation Needs (08/02/2022)   PRAPARE - Administrator, Civil Service (Medical): No    Lack of Transportation (Non-Medical): No  Physical Activity: Sufficiently Active (08/02/2022)   Exercise Vital Sign    Days of Exercise per Week: 7 days    Minutes of Exercise per Session: 60 min  Stress: No Stress Concern Present (08/02/2022)   [employer] of Occupational Health - Occupational Stress Questionnaire    Feeling of Stress : Not at all  Social Connections: Socially Integrated (08/02/2022)   Social Connection and Isolation Panel [NHANES]     Frequency of Communication with Friends and Family: More than three times a week    Frequency of Social Gatherings with Friends and Family: Once a week    Attends Religious Services: More than 4 times per year    Active Member of [employer] or Organizations: Yes    Attends Engineer, structural: More than 4 times per year    Marital Status: Married    Tobacco Counseling Counseling given: Not Answered   Clinical Intake:  Pre-visit preparation completed: Yes  Pain :  No/denies pain     BMI - recorded: 29 Nutritional Status: BMI 25 -29 Overweight Nutritional Risks: None Diabetes: No  How often do you need to have someone help you when you read instructions, pamphlets, or other written materials from your doctor or pharmacy?: 1 - Never What is the last grade level you completed in school?: 5 years of college   Interpreter Needed?: No  Information entered by :: James [name], CMA   Activities of Daily Living    08/02/2022   10:04 AM  In your present state of health, do you have any difficulty performing the following activities:  Hearing? 0  Vision? 0  Difficulty concentrating or making decisions? 0  Walking or climbing stairs? 0  Dressing or bathing? 0  Doing errands, shopping? 0  Preparing Food and eating ? N  Using the Toilet? N  In the past six months, have you accidently leaked urine? N  Do you have problems with loss of bowel control? N  Managing your Medications? N  Managing your Finances? N  Housekeeping or managing your Housekeeping? N    Patient Care Team: Pincus Sanes, MD as PCP - General (Internal Medicine) Luciana Axe Alford Highland, MD as Consulting Physician (Ophthalmology) Erlene Quan Vinnie Level, [facility] (Inactive) as Pharmacist (Pharmacist) Luciana Axe Alford Highland, MD as Consulting Physician (Ophthalmology) Sallye Lat, MD as Consulting Physician (Ophthalmology)  Indicate any recent Medical Services you may have received from other than [facility] providers in the past  year (date may be approximate).     Assessment:   This is a routine wellness examination for [facility].  Hearing/Vision screen Patient denied any hearing difficulty. No hearing aids. Patient does not wear any corrective lenses/contacts  Dietary issues and exercise activities discussed: Current Exercise Habits: Home exercise routine, Type of exercise: walking, Time (Minutes): 60, Frequency (Times/Week): 7, Weekly Exercise (Minutes/Week): 420, Intensity: Mild, Exercise limited by: None identified   Goals Addressed             This Visit's Progress    Patient Stated       My goal is to continue walking every day.       Depression Screen    08/02/2022   10:04 AM 04/26/2022    1:07 PM 04/28/2021    2:29 PM 04/23/2021   12:14 PM 04/18/2020    8:33 AM 04/17/2019    1:26 PM 01/27/2017   11:03 AM  PHQ 2/9 Scores  PHQ - 2 Score 0 0 0 0 0 0 0  PHQ- 9 Score  0 0 0       Fall Risk    08/02/2022   10:04 AM 04/26/2022    1:07 PM 04/28/2021    2:27 PM 04/23/2021   11:01 AM 04/18/2020    8:35 AM  Fall Risk   Falls in the past year? 0 0 0 0 0  Number falls in past yr: 0 0 0 0 0  Injury with Fall? 0 0 0 0 0  Risk for fall due to : No Fall Risks No Fall Risks No Fall Risks No Fall Risks No Fall Risks  Follow up Falls evaluation completed Falls evaluation completed Falls evaluation completed Falls evaluation completed     FALL RISK PREVENTION PERTAINING TO THE HOME:  Any stairs in or around the home? Yes  If so, are there any without handrails? No  Home free of loose throw rugs in walkways, pet beds, electrical cords, etc? Yes  Adequate lighting in your home to reduce  risk of falls? Yes   ASSISTIVE DEVICES UTILIZED TO PREVENT FALLS:  Life alert? No  Use of a cane, walker or w/c? No  Grab bars in the bathroom? Yes  Shower chair or bench in shower? Yes  Elevated toilet seat or a handicapped toilet? No   TIMED UP AND GO:  Was the test performed? No .  Length of time to ambulate 10 feet:  NA sec.   Patient stated that he has no issues with gait or balance; does not use any assistive devices.  Cognitive Function:  Patient is cogitatively intact.      08/02/2022   10:05 AM  6CIT Screen  What Year? 0 points  What month? 0 points  What time? 0 points  Count back from 20 0 points  Months in reverse 0 points  Repeat phrase 0 points  Total Score 0 points    Immunizations Immunization History  Administered Date(s) Administered   Influenza Whole 12/21/2006, 12/20/2008   Influenza, High Dose Seasonal PF 01/20/2022   Influenza, Quadrivalent, Recombinant, Inj, Pf 12/15/2017   Influenza,inj,Quad PF,6+ Mos 01/02/2013, 01/22/2015, 01/13/2016, 01/13/2017, 12/21/2018   Influenza-Unspecified 01/10/2014, 01/09/2016, 12/15/2017, 12/31/2020   PFIZER(Purple Top)SARS-COV-2 Vaccination 05/24/2019, 06/19/2019, 01/24/2020   Pneumococcal Conjugate-13 04/12/2018   Pneumococcal Polysaccharide-23 04/17/2019   Td 03/23/2003   Tdap 01/29/2014   Zoster Recombinat (Shingrix) 08/23/2019, 11/29/2019   Zoster, Live 01/02/2013    TDAP status: Up to date  Flu Vaccine status: Up to date  Pneumococcal vaccine status: Up to date  Covid-19 vaccine status: Completed vaccines  Qualifies for Shingles Vaccine? Yes   Zostavax completed No   Shingrix Completed?: Yes  Screening Tests Health Maintenance  Topic Date Due   COVID-19 Vaccine (4 - 2023-24 season) 08/18/2022 (Originally 11/20/2021)   INFLUENZA VACCINE  10/21/2022   Medicare Annual Wellness (AWV)  08/02/2023   DTaP/Tdap/Td (3 - Td or Tdap) 01/30/2024   COLONOSCOPY (Pts 45-98yrs Insurance coverage will need to be confirmed)  07/27/2028   Pneumonia Vaccine 42+ Years old  Completed   Hepatitis C Screening  Completed   Zoster Vaccines- Shingrix  Completed   HPV VACCINES  Aged Out    Health Maintenance  There are no preventive care reminders to display for this patient.   Colorectal cancer screening: Type of screening: Colonoscopy.  Completed 07/27/2021. Repeat every 7 years  Lung Cancer Screening: (Low Dose CT Chest recommended if Age 28-80 years, 30 pack-year currently smoking OR have quit w/in 15years.) does not qualify.   Lung Cancer Screening Referral: N/A  Additional Screening:  Hepatitis C Screening: does qualify; Completed 01/13/2016  Vision Screening: Recommended annual ophthalmology exams for early detection of glaucoma and other disorders of the eye. Is the patient up to date with their annual eye exam?  Yes  Who is the provider or what is the name of the office in which the patient attends annual eye exams? Dr. Dione Booze and Dr. Luciana Axe If pt is not established with a provider, would they like to be referred to a provider to establish care? No .   Dental Screening: Recommended annual dental exams for proper oral hygiene  Community Resource Referral / Chronic Care Management: CRR required this visit?  No   CCM required this visit?  No      Plan:     I have personally reviewed and noted the following in the patient's chart:   Medical and social history Use of alcohol, tobacco or illicit drugs  Current medications and supplements including opioid  prescriptions. Patient is not currently taking opioid prescriptions. Functional ability and status Nutritional status Physical activity Advanced directives List of other physicians Hospitalizations, surgeries, and ER visits in previous 12 months Vitals Screenings to include cognitive, depression, and falls Referrals and appointments  In addition, I have reviewed and discussed with patient certain preventive protocols, quality metrics, and best practice recommendations. A written personalized care plan for preventive services as well as general preventive health recommendations were provided to patient.     Marinus Maw, CMA   08/02/2022   Nurse Notes: N/A

## 2022-09-13 DIAGNOSIS — H65491 Other chronic nonsuppurative otitis media, right ear: Secondary | ICD-10-CM | POA: Diagnosis not present

## 2022-09-13 DIAGNOSIS — J392 Other diseases of pharynx: Secondary | ICD-10-CM | POA: Diagnosis not present

## 2022-09-13 DIAGNOSIS — Z9622 Myringotomy tube(s) status: Secondary | ICD-10-CM | POA: Diagnosis not present

## 2022-09-22 DIAGNOSIS — Z961 Presence of intraocular lens: Secondary | ICD-10-CM | POA: Diagnosis not present

## 2022-09-22 DIAGNOSIS — H40013 Open angle with borderline findings, low risk, bilateral: Secondary | ICD-10-CM | POA: Diagnosis not present

## 2022-09-22 DIAGNOSIS — H34212 Partial retinal artery occlusion, left eye: Secondary | ICD-10-CM | POA: Diagnosis not present

## 2022-09-22 DIAGNOSIS — H26492 Other secondary cataract, left eye: Secondary | ICD-10-CM | POA: Diagnosis not present

## 2022-12-13 ENCOUNTER — Encounter (INDEPENDENT_AMBULATORY_CARE_PROVIDER_SITE_OTHER): Payer: Medicare Other | Admitting: Ophthalmology

## 2023-01-04 DIAGNOSIS — Z23 Encounter for immunization: Secondary | ICD-10-CM | POA: Diagnosis not present

## 2023-02-01 DIAGNOSIS — C44612 Basal cell carcinoma of skin of right upper limb, including shoulder: Secondary | ICD-10-CM | POA: Diagnosis not present

## 2023-02-01 DIAGNOSIS — D485 Neoplasm of uncertain behavior of skin: Secondary | ICD-10-CM | POA: Diagnosis not present

## 2023-02-01 DIAGNOSIS — L814 Other melanin hyperpigmentation: Secondary | ICD-10-CM | POA: Diagnosis not present

## 2023-02-01 DIAGNOSIS — Z86006 Personal history of melanoma in-situ: Secondary | ICD-10-CM | POA: Diagnosis not present

## 2023-02-01 DIAGNOSIS — D2262 Melanocytic nevi of left upper limb, including shoulder: Secondary | ICD-10-CM | POA: Diagnosis not present

## 2023-02-01 DIAGNOSIS — L821 Other seborrheic keratosis: Secondary | ICD-10-CM | POA: Diagnosis not present

## 2023-02-01 DIAGNOSIS — D2272 Melanocytic nevi of left lower limb, including hip: Secondary | ICD-10-CM | POA: Diagnosis not present

## 2023-02-01 DIAGNOSIS — D225 Melanocytic nevi of trunk: Secondary | ICD-10-CM | POA: Diagnosis not present

## 2023-02-01 DIAGNOSIS — L578 Other skin changes due to chronic exposure to nonionizing radiation: Secondary | ICD-10-CM | POA: Diagnosis not present

## 2023-02-24 DIAGNOSIS — C44612 Basal cell carcinoma of skin of right upper limb, including shoulder: Secondary | ICD-10-CM | POA: Diagnosis not present

## 2023-04-10 ENCOUNTER — Other Ambulatory Visit: Payer: Self-pay | Admitting: Internal Medicine

## 2023-04-10 DIAGNOSIS — E782 Mixed hyperlipidemia: Secondary | ICD-10-CM

## 2023-04-20 ENCOUNTER — Telehealth: Payer: Self-pay | Admitting: Internal Medicine

## 2023-04-20 NOTE — Telephone Encounter (Signed)
Copied from CRM [id number]. Topic: Appointments - Scheduling Inquiry for Clinic >> Apr 20, 2023  1:55 PM Fredrich Romans wrote: Reason for CRM: patient is requesting a call from [facility] Dr Lawerance Bach CMA to get him schedule for his lab work before he come in for his physical.  Please place orders and I will schedule the pt.

## 2023-04-21 ENCOUNTER — Other Ambulatory Visit: Payer: Self-pay

## 2023-04-21 DIAGNOSIS — Z125 Encounter for screening for malignant neoplasm of prostate: Secondary | ICD-10-CM

## 2023-04-21 DIAGNOSIS — R7303 Prediabetes: Secondary | ICD-10-CM

## 2023-04-21 DIAGNOSIS — I6523 Occlusion and stenosis of bilateral carotid arteries: Secondary | ICD-10-CM

## 2023-04-21 DIAGNOSIS — I10 Essential (primary) hypertension: Secondary | ICD-10-CM

## 2023-04-21 DIAGNOSIS — E876 Hypokalemia: Secondary | ICD-10-CM

## 2023-04-21 DIAGNOSIS — E782 Mixed hyperlipidemia: Secondary | ICD-10-CM

## 2023-04-21 NOTE — Telephone Encounter (Signed)
Spoke with patient and lab appointment made

## 2023-04-22 ENCOUNTER — Other Ambulatory Visit (INDEPENDENT_AMBULATORY_CARE_PROVIDER_SITE_OTHER): Payer: Medicare Other

## 2023-04-22 DIAGNOSIS — Z125 Encounter for screening for malignant neoplasm of prostate: Secondary | ICD-10-CM | POA: Diagnosis not present

## 2023-04-22 DIAGNOSIS — E782 Mixed hyperlipidemia: Secondary | ICD-10-CM | POA: Diagnosis not present

## 2023-04-22 DIAGNOSIS — I10 Essential (primary) hypertension: Secondary | ICD-10-CM | POA: Diagnosis not present

## 2023-04-22 DIAGNOSIS — R7303 Prediabetes: Secondary | ICD-10-CM | POA: Diagnosis not present

## 2023-04-22 DIAGNOSIS — E876 Hypokalemia: Secondary | ICD-10-CM | POA: Diagnosis not present

## 2023-04-22 LAB — CBC WITH DIFFERENTIAL/PLATELET
Basophils Absolute: 0 10*3/uL (ref 0.0–0.1)
Basophils Relative: 0.4 % (ref 0.0–3.0)
Eosinophils Absolute: 0.4 10*3/uL (ref 0.0–0.7)
Eosinophils Relative: 6.4 % — ABNORMAL HIGH (ref 0.0–5.0)
HCT: 43.9 % (ref 39.0–52.0)
Hemoglobin: 14.9 g/dL (ref 13.0–17.0)
Lymphocytes Relative: 24.9 % (ref 12.0–46.0)
Lymphs Abs: 1.7 10*3/uL (ref 0.7–4.0)
MCHC: 33.9 g/dL (ref 30.0–36.0)
MCV: 88 fL (ref 78.0–100.0)
Monocytes Absolute: 0.6 10*3/uL (ref 0.1–1.0)
Monocytes Relative: 8.5 % (ref 3.0–12.0)
Neutro Abs: 4.1 10*3/uL (ref 1.4–7.7)
Neutrophils Relative %: 59.8 % (ref 43.0–77.0)
Platelets: 235 10*3/uL (ref 150.0–400.0)
RBC: 4.99 Mil/uL (ref 4.22–5.81)
RDW: 13.9 % (ref 11.5–15.5)
WBC: 6.9 10*3/uL (ref 4.0–10.5)

## 2023-04-22 LAB — LIPID PANEL
Cholesterol: 130 mg/dL (ref 0–200)
HDL: 45.6 mg/dL (ref >39.00)
LDL Cholesterol: 66 mg/dL (ref 0–99)
NonHDL: 84.11
Total CHOL/HDL Ratio: 3
Triglycerides: 91 mg/dL (ref 0.0–149.0)
VLDL: 18.2 mg/dL (ref 0.0–40.0)

## 2023-04-22 LAB — COMPREHENSIVE METABOLIC PANEL
ALT: 14 U/L (ref 0–53)
AST: 17 U/L (ref 0–37)
Albumin: 4.2 g/dL (ref 3.5–5.2)
Alkaline Phosphatase: 58 U/L (ref 39–117)
BUN: 17 mg/dL (ref 6–23)
CO2: 32 meq/L (ref 19–32)
Calcium: 8.8 mg/dL (ref 8.4–10.5)
Chloride: 98 meq/L (ref 96–112)
Creatinine, Ser: 1.07 mg/dL (ref 0.40–1.50)
GFR: 70.43 mL/min (ref >60.00)
Glucose, Bld: 100 mg/dL — ABNORMAL HIGH (ref 70–99)
Potassium: 3.3 meq/L — ABNORMAL LOW (ref 3.5–5.1)
Sodium: 139 meq/L (ref 135–145)
Total Bilirubin: 0.8 mg/dL (ref 0.2–1.2)
Total Protein: 6.4 g/dL (ref 6.0–8.3)

## 2023-04-22 LAB — TSH: TSH: 1.72 u[IU]/mL (ref 0.35–5.50)

## 2023-04-22 LAB — PSA, MEDICARE: PSA: 2.14 ng/mL (ref 0.10–4.00)

## 2023-04-22 LAB — HEMOGLOBIN A1C: Hgb A1c MFr Bld: 6.4 % (ref 4.6–6.5)

## 2023-04-28 ENCOUNTER — Encounter: Payer: Self-pay | Admitting: Internal Medicine

## 2023-04-28 NOTE — Progress Notes (Signed)
 Subjective:    Patient ID: James [name], male    DOB: 1951/02/25, 73 y.o.   MRN: [id number]     HPI [place] is here for a physical exam and his chronic medical problems.  He had labs done already.     Cough x 5-6 weeks.  He self diagnosed himself with mycoplasma PNA.  It seemed to go away 2 weeks ago but came back but is not has bad.    The cough could be all day and night.  His abdomen would hurt from coughing.  Now it is primarily in the morning when he wakes up and after he eats. It is a dry cough    Medications and allergies reviewed with patient and updated if appropriate.  Current Outpatient Medications on File Prior to Visit  Medication Sig Dispense Refill   albuterol  (VENTOLIN  HFA) 108 (90 Base) MCG/ACT inhaler Inhale 2 puffs into the lungs every 2 (two) hours as needed for wheezing or shortness of breath (cough). 8 g 0   aspirin  EC 81 MG tablet Take 81 mg by mouth daily.     atenolol -chlorthalidone  (TENORETIC ) 50-25 MG tablet TAKE 1 TABLET BY MOUTH DAILY 90 tablet 0   diphenhydrAMINE HCl, Sleep, (SLEEP AID) 50 MG CAPS Take 1 capsule by mouth at bedtime. As needed     doxazosin  (CARDURA ) 8 MG tablet TAKE 1 TABLET BY MOUTH EVERY NIGHT AT BEDTIME 90 tablet 0   fluticasone  (FLONASE ) 50 MCG/ACT nasal spray Place 2 sprays into both nostrils daily. 9.9 g 0   rosuvastatin  (CRESTOR ) 40 MG tablet TAKE 1 TABLET BY MOUTH DAILY 90 tablet 0   No current facility-administered medications on file prior to visit.    Review of Systems  Constitutional:  Negative for fever.  Eyes:  Negative for visual disturbance.  Respiratory:  Positive for cough. Negative for shortness of breath and wheezing.   Cardiovascular:  Negative for chest pain, palpitations and leg swelling.  Gastrointestinal:  Negative for abdominal pain, blood in stool (no melena), constipation and diarrhea.       Frequent gerd  Genitourinary:  Negative for difficulty urinating, dysuria and hematuria.   Musculoskeletal:  Negative for arthralgias and back pain.  Skin:  Negative for rash.  Neurological:  Negative for light-headedness and headaches.  Psychiatric/Behavioral:  Negative for dysphoric mood. The patient is not nervous/anxious.        Objective:   Vitals:   04/29/23 1300  BP: 132/72  Pulse: 74  Temp: 98 F (36.7 C)  SpO2: 97%   Filed Weights   04/29/23 1300  Weight: 241 lb (109.3 kg)   Body mass index is 29.34 kg/m.  BP Readings from Last 3 Encounters:  04/29/23 132/72  04/29/22 (!) 148/85  04/26/22 122/72    Wt Readings from Last 3 Encounters:  04/29/23 241 lb (109.3 kg)  04/29/22 240 lb (108.9 kg)  04/26/22 243 lb (110.2 kg)      Physical Exam Constitutional: He appears well-developed and well-nourished. No distress.  HENT:  Head: Normocephalic and atraumatic.  Right Ear: External ear normal.  Left Ear: External ear normal.  Normal ear canals and TM b/l  Mouth/Throat: Oropharynx is clear and moist. Eyes: Conjunctivae and EOM are normal.  Neck: Neck supple. No tracheal deviation present. No thyromegaly present.  No carotid bruit  Cardiovascular: Normal rate, regular rhythm, normal heart sounds and intact distal pulses.   No murmur heard.  No lower extremity edema. Pulmonary/Chest: Effort normal and breath sounds  normal. No respiratory distress. He has no wheezes. He has no rales.  Abdominal: Soft. He exhibits no distension. There is no tenderness.  Genitourinary: deferred  Lymphadenopathy:   He has no cervical adenopathy.  Skin: Skin is warm and dry. He is not diaphoretic.  Psychiatric: He has a normal mood and affect. His behavior is normal.         Assessment & Plan:   Physical exam: Screening blood work  ordered Exercise   joined [employer] last week - has gone 3 times already, walking 1-2 miles daily Weight  - encouraged weight loss Substance abuse   none   Reviewed recommended immunizations.   Health Maintenance  Topic Date  Due   COVID-19 Vaccine (4 - 2024-25 season) 05/15/2023 (Originally 11/21/2022)   Medicare Annual Wellness (AWV)  08/02/2023   DTaP/Tdap/Td (3 - Td or Tdap) 01/30/2024   Colonoscopy  07/27/2028   Pneumonia Vaccine 68+ Years old  Completed   INFLUENZA VACCINE  Completed   Hepatitis C Screening  Completed   Zoster Vaccines- Shingrix  Completed   HPV VACCINES  Aged Out     See Problem List for Assessment and Plan of chronic medical problems.

## 2023-04-29 ENCOUNTER — Ambulatory Visit (INDEPENDENT_AMBULATORY_CARE_PROVIDER_SITE_OTHER): Payer: Medicare Other | Admitting: Internal Medicine

## 2023-04-29 VITALS — BP 132/72 | HR 74 | Temp 98.0°F | Ht 76.0 in | Wt 241.0 lb

## 2023-04-29 DIAGNOSIS — Z0000 Encounter for general adult medical examination without abnormal findings: Secondary | ICD-10-CM | POA: Diagnosis not present

## 2023-04-29 DIAGNOSIS — E876 Hypokalemia: Secondary | ICD-10-CM

## 2023-04-29 DIAGNOSIS — E782 Mixed hyperlipidemia: Secondary | ICD-10-CM

## 2023-04-29 DIAGNOSIS — R7303 Prediabetes: Secondary | ICD-10-CM

## 2023-04-29 DIAGNOSIS — K219 Gastro-esophageal reflux disease without esophagitis: Secondary | ICD-10-CM

## 2023-04-29 DIAGNOSIS — Z85828 Personal history of other malignant neoplasm of skin: Secondary | ICD-10-CM | POA: Insufficient documentation

## 2023-04-29 DIAGNOSIS — I10 Essential (primary) hypertension: Secondary | ICD-10-CM | POA: Diagnosis not present

## 2023-04-29 MED ORDER — OMEPRAZOLE 40 MG PO CPDR: 40.0000 mg | DELAYED_RELEASE_CAPSULE | Freq: Two times a day (BID) | ORAL | 1 refills | Status: AC

## 2023-04-29 MED ORDER — POTASSIUM CHLORIDE CRYS ER 20 MEQ PO TBCR: EXTENDED_RELEASE_TABLET | ORAL | 1 refills | Status: DC

## 2023-04-29 NOTE — Assessment & Plan Note (Addendum)
 Chronic Blood pressure well controlled Reviewed CMP Continue atenolol -chlorthalidone  50-25 mg daily Discussed hypokalemia and likely because of chlorthalidone -he would prefer to increase potassium intake versus changing chlorthalidone 

## 2023-04-29 NOTE — Assessment & Plan Note (Addendum)
 Chronic Secondary to chlorthalidone  Can consider discontinuing chlorthalidone , blood pressure is well-controlled-because blood pressure is well-controlled he would prefer to increase potassium Continue potassium chloride  but increase to 100  mEq daily -advised splitting up the dose

## 2023-04-29 NOTE — Patient Instructions (Addendum)
 Blood work was ordered.   Have this done prior to your next appointment.     Medications changes include :   omeprazole  increased to 2 times a day - take 30 minutes prior to a meal.  Increase potassium to 5 pills a day     Return in about 6 months (around 10/27/2023) for follow up.   Health Maintenance, Male Adopting a healthy lifestyle and getting preventive care are important in promoting health and wellness. Ask your health care provider about: The right schedule for you to have regular tests and exams. Things you can do on your own to prevent diseases and keep yourself healthy. What should I know about diet, weight, and exercise? Eat a healthy diet  Eat a diet that includes plenty of vegetables, fruits, low-fat dairy products, and lean protein. Do not eat a lot of foods that are high in solid fats, added sugars, or sodium. Maintain a healthy weight Body mass index (BMI) is a measurement that can be used to identify possible weight problems. It estimates body fat based on height and weight. Your health care provider can help determine your BMI and help you achieve or maintain a healthy weight. Get regular exercise Get regular exercise. This is one of the most important things you can do for your health. Most adults should: Exercise for at least 150 minutes each week. The exercise should increase your heart rate and make you sweat (moderate-intensity exercise). Do strengthening exercises at least twice a week. This is in addition to the moderate-intensity exercise. Spend less time sitting. Even light physical activity can be beneficial. Watch cholesterol and blood lipids Have your blood tested for lipids and cholesterol at 71 years of age, then have this test every 5 years. You may need to have your cholesterol levels checked more often if: Your lipid or cholesterol levels are high. You are older than 71 years of age. You are at high risk for heart disease. What should I  know about cancer screening? Many types of cancers can be detected early and may often be prevented. Depending on your health history and family history, you may need to have cancer screening at various ages. This may include screening for: Colorectal cancer. Prostate cancer. Skin cancer. Lung cancer. What should I know about heart disease, diabetes, and high blood pressure? Blood pressure and heart disease High blood pressure causes heart disease and increases the risk of stroke. This is more likely to develop in people who have high blood pressure readings or are overweight. Talk with your health care provider about your target blood pressure readings. Have your blood pressure checked: Every 3-5 years if you are 45-66 years of age. Every year if you are 36 years old or older. If you are between the ages of 59 and 5 and are a current or former smoker, ask your health care provider if you should have a one-time screening for abdominal aortic aneurysm (AAA). Diabetes Have regular diabetes screenings. This checks your fasting blood sugar level. Have the screening done: Once every three years after age 89 if you are at a normal weight and have a low risk for diabetes. More often and at a younger age if you are overweight or have a high risk for diabetes. What should I know about preventing infection? Hepatitis B If you have a higher risk for hepatitis B, you should be screened for this virus. Talk with your health care provider to find out if you  are at risk for hepatitis B infection. Hepatitis C Blood testing is recommended for: Everyone born from [birth year] through 1963. Anyone with known risk factors for hepatitis C. Sexually transmitted infections (STIs) You should be screened each year for STIs, including gonorrhea and chlamydia, if: You are sexually active and are younger than 71 years of age. You are older than 71 years of age and your health care provider tells you that you are at risk  for this type of infection. Your sexual activity has changed since you were last screened, and you are at increased risk for chlamydia or gonorrhea. Ask your health care provider if you are at risk. Ask your health care provider about whether you are at high risk for HIV. Your health care provider may recommend a prescription medicine to help prevent HIV infection. If you choose to take medicine to prevent HIV, you should first get tested for HIV. You should then be tested every 3 months for as long as you are taking the medicine. Follow these instructions at home: Alcohol use Do not drink alcohol if your health care provider tells you not to drink. If you drink alcohol: Limit how much you have to 0-2 drinks a day. Know how much alcohol is in your drink. In the [place] one drink equals one 12 oz bottle of beer (355 mL), one 5 oz glass of wine (148 mL), or one 1 oz glass of hard liquor (44 mL). Lifestyle Do not use any products that contain nicotine or tobacco. These products include cigarettes, chewing tobacco, and vaping devices, such as e-cigarettes. If you need help quitting, ask your health care provider. Do not use street drugs. Do not share needles. Ask your health care provider for help if you need support or information about quitting drugs. General instructions Schedule regular health, dental, and eye exams. Stay current with your vaccines. Tell your health care provider if: You often feel depressed. You have ever been abused or do not feel safe at home. Summary Adopting a healthy lifestyle and getting preventive care are important in promoting health and wellness. Follow your health care provider's instructions about healthy diet, exercising, and getting tested or screened for diseases. Follow your health care provider's instructions on monitoring your cholesterol and blood pressure. This information is not intended to replace advice given to you by your health care provider. Make  sure you discuss any questions you have with your health care provider. Document Revised: 07/28/2020 Document Reviewed: 07/28/2020 Elsevier Patient Education  2024 [employer].

## 2023-04-29 NOTE — Assessment & Plan Note (Signed)
Chronic Regular exercise and healthy diet encouraged Check lipid panel  Continue Crestor 40 mg daily

## 2023-04-29 NOTE — Assessment & Plan Note (Addendum)
 Chronic GERD uncontrolled-having frequent heartburn Discussed that his cough is likely related to his heartburn Taking omeprazole  40 mg daily 5 times a week Increase omeprazole  to 40 mg twice daily AC-discussed we need to control his heartburn very well and then hopefully decrease the amount of medication he is taking Advised him that if his GERD is not controlled with the twice a day dosing he needs to let me know

## 2023-04-29 NOTE — Assessment & Plan Note (Signed)
 Chronic Lab Results  Component Value Date   HGBA1C 6.4 04/22/2023   Low sugar / carb diet Stressed regular exercise Advised weight loss

## 2023-05-05 ENCOUNTER — Ambulatory Visit (HOSPITAL_COMMUNITY)
Admission: RE | Admit: 2023-05-05 | Discharge: 2023-05-05 | Disposition: A | Discharge status: Home / Self Care | Payer: Medicare Other | Source: Ambulatory Visit | Attending: Vascular Surgery | Admitting: Vascular Surgery

## 2023-05-05 ENCOUNTER — Ambulatory Visit: Payer: Medicare Other | Admitting: Physician Assistant

## 2023-05-05 VITALS — BP 128/81 | HR 66 | Temp 97.4°F | Ht 76.0 in | Wt 237.8 lb

## 2023-05-05 DIAGNOSIS — I6523 Occlusion and stenosis of bilateral carotid arteries: Secondary | ICD-10-CM | POA: Diagnosis not present

## 2023-05-05 NOTE — Progress Notes (Deleted)
History of Present Illness:  Patient is a 73 y.o. year old male who presents for evaluation of carotid stenosis.  The patient denies symptoms of TIA, amaurosis, or stroke.  The patient is currently on *** antiplatelet therapy.  The carotid stenosis was found ***.  Other medical problems include ***.  These are currently stable and followed by ***.  Past Medical History:  Diagnosis Date   Allergy    seasonal   Angina    Anxiety    especially revolving around surgery & medical care    Cancer [facility])    forehead stage 1 melonoma,year ago updated 07/06/21   GERD (gastroesophageal reflux disease)    Heart murmur    Hemorrhoids    History of stress test 03/23/2011   told it was normal   Hyperlipidemia    Hypertension    Neuromuscular disorder (HCC)    spinal stenosis    Shortness of breath     Past Surgical History:  Procedure Laterality Date   APPENDECTOMY     CERVICAL DISC SURGERY     COLONOSCOPY     EYE SURGERY  11/29/2009   x3- (total of 7 eye surgery)    LUMBAR LAMINECTOMY  11/20/1988   x2   MOHS SURGERY     2022,updated 07/06/21   TESTICLE TORSION REDUCTION     UPPER GASTROINTESTINAL ENDOSCOPY       Social History Social History   Tobacco Use   Smoking status: Never    Passive exposure: Never   Smokeless tobacco: Never  Vaping Use   Vaping status: Never Used  Substance Use Topics   Alcohol use: Yes    Comment: 1 bottle wine a week, on weekends    Drug use: Not Currently    Types: Marijuana    Comment: recreationally- early August- 2016    Family History Family History  Problem Relation Age of Onset   Cancer Mother        skin cancer/melanoma   Hypertension Father    Heart disease Father    Colon cancer Neg Hx    Colon polyps Neg Hx    Crohn's disease Neg Hx    Esophageal cancer Neg Hx    Rectal cancer Neg Hx    Stomach cancer Neg Hx     Allergies  Allergies  Allergen Reactions   Hydrocodone Itching     Current Outpatient Medications   Medication Sig Dispense Refill   albuterol (VENTOLIN HFA) 108 (90 Base) MCG/ACT inhaler Inhale 2 puffs into the lungs every 2 (two) hours as needed for wheezing or shortness of breath (cough). 8 g 0   aspirin EC 81 MG tablet Take 81 mg by mouth daily.     atenolol-chlorthalidone (TENORETIC) 50-25 MG tablet TAKE 1 TABLET BY MOUTH DAILY 90 tablet 0   diphenhydrAMINE HCl, Sleep, (SLEEP AID) 50 MG CAPS Take 1 capsule by mouth at bedtime. As needed     doxazosin (CARDURA) 8 MG tablet TAKE 1 TABLET BY MOUTH EVERY NIGHT AT BEDTIME 90 tablet 0   fluticasone (FLONASE) 50 MCG/ACT nasal spray Place 2 sprays into both nostrils daily. 9.9 g 0   omeprazole (PRILOSEC) 40 MG capsule Take 1 capsule (40 mg total) by mouth 2 (two) times daily before a meal. 180 capsule 1   potassium chloride SA (KLOR-CON M20) 20 MEQ tablet Take 40 mEq in morning and 60 mEq in evening 450 tablet 1   rosuvastatin (CRESTOR) 40 MG tablet TAKE 1 TABLET BY MOUTH DAILY  90 tablet 0   No current facility-administered medications for this visit.    ROS:   General:  No weight loss, Fever, chills  HEENT: No recent headaches, no nasal bleeding, no visual changes, no sore throat  Neurologic: No dizziness, blackouts, seizures. No recent symptoms of stroke or mini- stroke. No recent episodes of slurred speech, or temporary blindness.  Cardiac: No recent episodes of chest pain/pressure, no shortness of breath at rest.  No shortness of breath with exertion.  Denies history of atrial fibrillation or irregular heartbeat  Vascular: No history of rest pain in feet.  No history of claudication.  No history of non-healing ulcer, No history of DVT   Pulmonary: No home oxygen, no productive cough, no hemoptysis,  No asthma or wheezing  Musculoskeletal:  [ ]  Arthritis, [ ]  Low back pain,  [ ]  Joint pain  Hematologic:No history of hypercoagulable state.  No history of easy bleeding.  No history of anemia  Gastrointestinal: No hematochezia or  melena,  No gastroesophageal reflux, no trouble swallowing  Urinary: [ ]  chronic Kidney disease, [ ]  on HD - [ ]  MWF or [ ]  TTHS, [ ]  Burning with urination, [ ]  Frequent urination, [ ]  Difficulty urinating;   Skin: No rashes  Psychological: No history of anxiety,  No history of depression   Physical Examination  There were no vitals filed for this visit.  There is no height or weight on file to calculate BMI.  General:  Alert and oriented, no acute distress HEENT: Normal Neck: No bruit or JVD Pulmonary: Clear to auscultation bilaterally Cardiac: Regular Rate and Rhythm without murmur Gastrointestinal: Soft, non-tender, non-distended, no mass, no scars Skin: No rash Extremity Pulses:  2+ radial, brachial, femoral, dorsalis pedis, posterior tibial pulses bilaterally Musculoskeletal: No deformity or edema  Neurologic: Upper and lower extremity motor 5/5 and symmetric  DATA:    ASSESSMENT:    PLAN:   Fabienne Bruns, MD [facility] [place] Office: [phone] Pager: [phone]

## 2023-05-05 NOTE — Progress Notes (Signed)
Office Note     CC:  follow up Requesting Provider:  Pincus Sanes, MD  HPI: James [name] is a 73 y.o. (02/16/1951) male who presents for routine follow up of carotid artery stenosis. We have been following his left ICA stenosis of 40-59% and left ICA stenosis of 1-39%. He has no history of TIA or stroke.   Today he reports overall doing well. No major changes in his medical history since he was here 1 year ago. He denies any visual changes or amaurosis, no slurred speech, facial drooping, unilateral upper or lower extremity weakness or numbness. He denies any pain in his legs on ambulation or rest. No tissue loss. He remains very active. He walks 1-2 miles daily. He is medically managed on Aspirin and Statin. He is a non smoker.   Past Medical History:  Diagnosis Date   Allergy    seasonal   Angina    Anxiety    especially revolving around surgery & medical care    Cancer [facility])    forehead stage 1 melonoma,year ago updated 07/06/21   GERD (gastroesophageal reflux disease)    Heart murmur    Hemorrhoids    History of stress test 03/23/2011   told it was normal   Hyperlipidemia    Hypertension    Neuromuscular disorder (HCC)    spinal stenosis    Shortness of breath     Past Surgical History:  Procedure Laterality Date   APPENDECTOMY     CERVICAL DISC SURGERY     COLONOSCOPY     EYE SURGERY  11/29/2009   x3- (total of 7 eye surgery)    LUMBAR LAMINECTOMY  11/20/1988   x2   MOHS SURGERY     2022,updated 07/06/21   TESTICLE TORSION REDUCTION     UPPER GASTROINTESTINAL ENDOSCOPY      Social History   Socioeconomic History   Marital status: Married    Spouse name: Not on file   Number of children: Not on file   Years of education: Not on file   Highest education level: Not on file  Occupational History   Not on file  Tobacco Use   Smoking status: Never    Passive exposure: Never   Smokeless tobacco: Never  Vaping Use   Vaping status: Never Used  Substance  and Sexual Activity   Alcohol use: Yes    Comment: 1 bottle wine a week, on weekends    Drug use: Not Currently    Types: Marijuana    Comment: recreationally- early August- 2016   Sexual activity: Yes  Other Topics Concern   Not on file  Social History Narrative   [employer] driver    No tobacco   No ets    Married    Wife works Mining engineer   Social Drivers of Corporate investment banker Strain: Low Risk  (08/02/2022)   Overall Financial Resource Strain (CARDIA)    Difficulty of Paying Living Expenses: Not hard at all  Food Insecurity: Low Risk  (09/13/2022)   Received from [facility]   Hunger Vital Sign    Worried About Running Out of Food in the Last Year: Never true    Ran Out of Food in the Last Year: Never true  Transportation Needs: Not on file (09/13/2022)  Physical Activity: Sufficiently Active (08/02/2022)   Exercise Vital Sign    Days of Exercise per Week: 7 days    Minutes of Exercise per Session:  60 min  Stress: No Stress Concern Present (08/02/2022)   [employer] of Occupational Health - Occupational Stress Questionnaire    Feeling of Stress : Not at all  Social Connections: Socially Integrated (08/02/2022)   Social Connection and Isolation Panel [NHANES]    Frequency of Communication with Friends and Family: More than three times a week    Frequency of Social Gatherings with Friends and Family: Once a week    Attends Religious Services: More than 4 times per year    Active Member of [employer] or Organizations: Yes    Attends Engineer, structural: More than 4 times per year    Marital Status: Married  Catering manager Violence: Not At Risk (08/02/2022)   Humiliation, Afraid, Rape, and Kick questionnaire    Fear of Current or Ex-Partner: No    Emotionally Abused: No    Physically Abused: No    Sexually Abused: No    Family History  Problem Relation Age of Onset   Cancer Mother        skin cancer/melanoma   Hypertension Father    Heart  disease Father    Colon cancer Neg Hx    Colon polyps Neg Hx    Crohn's disease Neg Hx    Esophageal cancer Neg Hx    Rectal cancer Neg Hx    Stomach cancer Neg Hx     Current Outpatient Medications  Medication Sig Dispense Refill   albuterol (VENTOLIN HFA) 108 (90 Base) MCG/ACT inhaler Inhale 2 puffs into the lungs every 2 (two) hours as needed for wheezing or shortness of breath (cough). 8 g 0   aspirin EC 81 MG tablet Take 81 mg by mouth daily.     atenolol-chlorthalidone (TENORETIC) 50-25 MG tablet TAKE 1 TABLET BY MOUTH DAILY 90 tablet 0   diphenhydrAMINE HCl, Sleep, (SLEEP AID) 50 MG CAPS Take 1 capsule by mouth at bedtime. As needed     doxazosin (CARDURA) 8 MG tablet TAKE 1 TABLET BY MOUTH EVERY NIGHT AT BEDTIME 90 tablet 0   fluticasone (FLONASE) 50 MCG/ACT nasal spray Place 2 sprays into both nostrils daily. 9.9 g 0   omeprazole (PRILOSEC) 40 MG capsule Take 1 capsule (40 mg total) by mouth 2 (two) times daily before a meal. 180 capsule 1   potassium chloride SA (KLOR-CON M20) 20 MEQ tablet Take 40 mEq in morning and 60 mEq in evening 450 tablet 1   rosuvastatin (CRESTOR) 40 MG tablet TAKE 1 TABLET BY MOUTH DAILY 90 tablet 0   No current facility-administered medications for this visit.    Allergies  Allergen Reactions   Hydrocodone Itching     REVIEW OF SYSTEMS:  [X]  denotes positive finding, [ ]  denotes negative finding Cardiac  Comments:  Chest pain or chest pressure:    Shortness of breath upon exertion:    Short of breath when lying flat:    Irregular heart rhythm:        Vascular    Pain in calf, thigh, or hip brought on by ambulation:    Pain in feet at night that wakes you up from your sleep:     Blood clot in your veins:    Leg swelling:         Pulmonary    Oxygen at home:    Productive cough:     Wheezing:         Neurologic    Sudden weakness in arms or legs:     Sudden  numbness in arms or legs:     Sudden onset of difficulty speaking or  slurred speech:    Temporary loss of vision in one eye:     Problems with dizziness:         Gastrointestinal    Blood in stool:     Vomited blood:         Genitourinary    Burning when urinating:     Blood in urine:        Psychiatric    Major depression:         Hematologic    Bleeding problems:    Problems with blood clotting too easily:        Skin    Rashes or ulcers:        Constitutional    Fever or chills:      PHYSICAL EXAMINATION:  Vitals:   05/05/23 1244  BP: 128/81  Pulse: 66  Temp: (!) 97.4 F (36.3 C)  SpO2: 94%  Weight: 237 lb 12.8 oz (107.9 kg)  Height: 6\' 4"  (1.93 m)    General:  WDWN in NAD; vital signs documented above Gait: Normal  HENT: WNL, normocephalic Pulmonary: normal non-labored breathing without wheezing Cardiac: regular HR Abdomen: soft Vascular Exam/Pulses: 2+ radial pulses, 2+ DP pulses bilaterally Extremities: without ischemic changes, without Gangrene , without cellulitis; without open wounds;  Musculoskeletal: no muscle wasting or atrophy  Neurologic: A&O X 3 Psychiatric:  The pt has Normal affect.   Non-Invasive Vascular Imaging:   VAS US Carotid Duplex: Summary:  Right Carotid: Velocities in the right ICA are consistent with a 1-39% stenosis.   Left Carotid: Velocities in the left ICA are consistent with a 40-59% stenosis.   Vertebrals: Bilateral vertebral arteries demonstrate antegrade flow.    ASSESSMENT/PLAN:: 73 y.o. male here for follow up for carotid artery stenosis. He remains without any associated neurological symptoms. Duplex today shows stable right ICA stenosis of 1-39%, left ICA stenosis of 40-59%. Normal flow in the vertebral arteries bilaterally - Continue Aspirin and Statin - Reviewed signs and symptoms of TIA/ Stroke and he understands should this occur to seek immediate medical attention - He will follow up in 1 year with repeat carotid duplex   Graceann Congress, PA-C [facility] [phone]  Clinic MD:   Karin Lieu

## 2023-07-19 ENCOUNTER — Other Ambulatory Visit: Payer: Self-pay | Admitting: Internal Medicine

## 2023-07-19 DIAGNOSIS — E782 Mixed hyperlipidemia: Secondary | ICD-10-CM

## 2023-07-26 DIAGNOSIS — D485 Neoplasm of uncertain behavior of skin: Secondary | ICD-10-CM | POA: Diagnosis not present

## 2023-07-26 DIAGNOSIS — L821 Other seborrheic keratosis: Secondary | ICD-10-CM | POA: Diagnosis not present

## 2023-07-26 DIAGNOSIS — D225 Melanocytic nevi of trunk: Secondary | ICD-10-CM | POA: Diagnosis not present

## 2023-07-26 DIAGNOSIS — D2272 Melanocytic nevi of left lower limb, including hip: Secondary | ICD-10-CM | POA: Diagnosis not present

## 2023-07-26 DIAGNOSIS — L578 Other skin changes due to chronic exposure to nonionizing radiation: Secondary | ICD-10-CM | POA: Diagnosis not present

## 2023-07-26 DIAGNOSIS — D2262 Melanocytic nevi of left upper limb, including shoulder: Secondary | ICD-10-CM | POA: Diagnosis not present

## 2023-07-26 DIAGNOSIS — D2271 Melanocytic nevi of right lower limb, including hip: Secondary | ICD-10-CM | POA: Diagnosis not present

## 2023-07-26 DIAGNOSIS — Z86006 Personal history of melanoma in-situ: Secondary | ICD-10-CM | POA: Diagnosis not present

## 2023-07-26 DIAGNOSIS — L814 Other melanin hyperpigmentation: Secondary | ICD-10-CM | POA: Diagnosis not present

## 2023-08-19 ENCOUNTER — Ambulatory Visit

## 2023-08-19 VITALS — Ht 76.0 in | Wt 230.0 lb

## 2023-08-19 DIAGNOSIS — Z0000 Encounter for general adult medical examination without abnormal findings: Secondary | ICD-10-CM

## 2023-08-19 NOTE — Patient Instructions (Addendum)
 James [name] , Thank you for taking time out of your busy schedule to complete your Annual Wellness Visit with me. I enjoyed our conversation and look forward to speaking with you again next year. I, as well as your care team,  appreciate your ongoing commitment to your health goals. Please review the following plan we discussed and let me know if I can assist you in the future. Your Game plan/ To Do List   Follow up Visits: Next Medicare AWV with our clinical staff: 08/22/2024 at 10:10am Have you seen your provider in the last 6 months (3 months if uncontrolled diabetes)? Yes Next Office Visit with your provider: 10/31/2023  Clinician Recommendations:  Aim for 30 minutes of exercise or brisk walking, 6-8 glasses of water, and 5 servings of fruits and vegetables each day. Educated and advised on getting the Tdap (Tetenus) vaccine at local pharmacy in 2025.       This is a list of the screening recommended for you and due dates:  Health Maintenance  Topic Date Due   COVID-19 Vaccine (4 - 2024-25 season) 11/21/2022   Flu Shot  10/21/2023   DTaP/Tdap/Td vaccine (3 - Td or Tdap) 01/30/2024   Medicare Annual Wellness Visit  08/18/2024   Colon Cancer Screening  07/27/2028   Pneumonia Vaccine  Completed   Hepatitis C Screening  Completed   Zoster (Shingles) Vaccine  Completed   HPV Vaccine  Aged Out   Meningitis B Vaccine  Aged Out    Advanced directives: (Copy Requested) Please bring a copy of your health care power of attorney and living will to the office to be added to your chart at your convenience. You can mail to [facility] [address] [place], [place] [address] or email to [email or web address] Advance Care Planning is important because it:  [x]  Makes sure you receive the medical care that is consistent with your values, goals, and preferences  [x]  It provides guidance to your family and loved ones and reduces their decisional burden about whether or not they are  making the right decisions based on your wishes.  Follow the link provided in your after visit summary or read over the paperwork we have mailed to you to help you started getting your Advance Directives in place. If you need assistance in completing these, please reach out to us  so that we can help you!

## 2023-08-19 NOTE — Progress Notes (Signed)
 Subjective:   James Costabile. is a 73 y.o. who presents for a Medicare Wellness preventive visit.  As a reminder, Annual Wellness Visits don't include a physical exam, and some assessments may be limited, especially if this visit is performed virtually. We may recommend an in-person follow-up visit with your provider if needed.  Visit Complete: Virtual I connected with  James Isaacs. on 08/19/23 by a audio enabled telemedicine application and verified that I am speaking with the correct person using two identifiers.  Patient Location: Home  Provider Location: Office/Clinic  I discussed the limitations of evaluation and management by telemedicine. The patient expressed understanding and agreed to proceed.  Vital Signs: Because this visit was a virtual/telehealth visit, some criteria may be missing or patient reported. Any vitals not documented were not able to be obtained and vitals that have been documented are patient reported.  VideoDeclined- This patient declined Librarian, academic. Therefore the visit was completed with audio only.  Persons Participating in Visit: Patient.  AWV Questionnaire: No: Patient Medicare AWV questionnaire was not completed prior to this visit.  Cardiac Risk Factors include: advanced age (>57men, >58 women);dyslipidemia;hypertension;male gender     Objective:     Today's Vitals   08/19/23 1054  Weight: 230 lb (104.3 kg)  Height: 6\' 4"  (1.93 m)   Body mass index is 28 kg/m.     08/02/2022   10:04 AM 04/28/2021    2:25 PM 04/18/2020    8:35 AM 12/11/2014   10:09 AM 11/04/2014    2:04 PM 05/27/2011    7:09 PM  Advanced Directives  Does Patient Have a Medical Advance Directive? Yes Yes Yes Yes Yes Patient has advance directive, copy not in chart  Type of Advance Directive Healthcare Power of [place];Living will Living will;Healthcare Power of Attorney Living will;Healthcare Power of [employer] Power of  [place];Living will Living will --  Does patient want to make changes to medical advance directive? No - Patient declined No - Patient declined No - Patient declined No - Patient declined No - Patient declined   Copy of Healthcare Power of Attorney in Chart? No - copy requested No - copy requested No - copy requested No - copy requested      Current Medications (verified) Outpatient Encounter Medications as of 08/19/2023  Medication Sig   aspirin  EC 81 MG tablet Take 81 mg by mouth daily.   atenolol -chlorthalidone  (TENORETIC ) 50-25 MG tablet TAKE 1 TABLET BY MOUTH DAILY   doxazosin  (CARDURA ) 8 MG tablet TAKE 1 TABLET BY MOUTH EVERY NIGHT AT BEDTIME   omeprazole  (PRILOSEC) 40 MG capsule Take 1 capsule (40 mg total) by mouth 2 (two) times daily before a meal.   potassium chloride  SA (KLOR-CON  M20) 20 MEQ tablet TAKE 2 TABLETS BY MOUTH TWICE A DAY   rosuvastatin  (CRESTOR ) 40 MG tablet TAKE 1 TABLET BY MOUTH DAILY   albuterol  (VENTOLIN  HFA) 108 (90 Base) MCG/ACT inhaler Inhale 2 puffs into the lungs every 2 (two) hours as needed for wheezing or shortness of breath (cough).   diphenhydrAMINE HCl, Sleep, (SLEEP AID) 50 MG CAPS Take 1 capsule by mouth at bedtime. As needed   fluticasone  (FLONASE ) 50 MCG/ACT nasal spray Place 2 sprays into both nostrils daily.   No facility-administered encounter medications on file as of 08/19/2023.    Allergies (verified) Hydrocodone    History: Past Medical History:  Diagnosis Date   Allergy    seasonal   Angina    Anxiety  especially revolving around surgery & medical care    Cancer [facility])    forehead stage 1 melonoma,year ago updated 07/06/21   GERD (gastroesophageal reflux disease)    Heart murmur    Hemorrhoids    History of stress test 03/23/2011   told it was normal   Hyperlipidemia    Hypertension    Neuromuscular disorder (HCC)    spinal stenosis    Shortness of breath    Past Surgical History:  Procedure Laterality Date   APPENDECTOMY      CERVICAL DISC SURGERY     COLONOSCOPY     EYE SURGERY  11/29/2009   x3- (total of 7 eye surgery)    LUMBAR LAMINECTOMY  11/20/1988   x2   MOHS SURGERY     2022,updated 07/06/21   TESTICLE TORSION REDUCTION     UPPER GASTROINTESTINAL ENDOSCOPY     Family History  Problem Relation Age of Onset   Cancer Mother        skin cancer/melanoma   Hypertension Father    Heart disease Father    Colon cancer Neg Hx    Colon polyps Neg Hx    Crohn's disease Neg Hx    Esophageal cancer Neg Hx    Rectal cancer Neg Hx    Stomach cancer Neg Hx    Social History   Socioeconomic History   Marital status: Married    Spouse name: Not on file   Number of children: Not on file   Years of education: Not on file   Highest education level: Not on file  Occupational History   Not on file  Tobacco Use   Smoking status: Never    Passive exposure: Never   Smokeless tobacco: Never  Vaping Use   Vaping status: Never Used  Substance and Sexual Activity   Alcohol use: Yes    Alcohol/week: 1.0 standard drink of alcohol    Types: 1 Glasses of wine per week    Comment: 1 bottle wine a week, on weekends    Drug use: Not Currently    Types: Marijuana    Comment: recreationally- early August- 2016   Sexual activity: Yes  Other Topics Concern   Not on file  Social History Narrative   [employer] driver    No tobacco   No ets    Married    Wife works Mining engineer   Social Drivers of Corporate investment banker Strain: Low Risk  (08/19/2023)   Overall Financial Resource Strain (CARDIA)    Difficulty of Paying Living Expenses: Not hard at all  Food Insecurity: No Food Insecurity (08/19/2023)   Hunger Vital Sign    Worried About Running Out of Food in the Last Year: Never true    Ran Out of Food in the Last Year: Never true  Transportation Needs: No Transportation Needs (08/19/2023)   PRAPARE - Administrator, Civil Service (Medical): No    Lack of Transportation  (Non-Medical): No  Physical Activity: Sufficiently Active (08/19/2023)   Exercise Vital Sign    Days of Exercise per Week: 5 days    Minutes of Exercise per Session: 60 min  Stress: No Stress Concern Present (08/19/2023)   [employer] of Occupational Health - Occupational Stress Questionnaire    Feeling of Stress : Not at all  Social Connections: Socially Integrated (08/19/2023)   Social Connection and Isolation Panel [NHANES]    Frequency of Communication with Friends and Family: More than  three times a week    Frequency of Social Gatherings with Friends and Family: Once a week    Attends Religious Services: More than 4 times per year    Active Member of [employer] or Organizations: Yes    Attends Engineer, structural: More than 4 times per year    Marital Status: Married    Tobacco Counseling Counseling given: No    Clinical Intake:  Pre-visit preparation completed: Yes  Pain : No/denies pain     BMI - recorded: 28 Nutritional Risks: None Diabetes: No  Lab Results  Component Value Date   HGBA1C 6.4 04/22/2023   HGBA1C 6.2 04/16/2022   HGBA1C 6.0 04/17/2021     How often do you need to have someone help you when you read instructions, pamphlets, or other written materials from your doctor or pharmacy?: 1 - Never  Interpreter Needed?: No  Information entered by :: Kandy Orris, CMA   Activities of Daily Living     08/19/2023   10:56 AM  In your present state of health, do you have any difficulty performing the following activities:  Hearing? 0  Vision? 0  Difficulty concentrating or making decisions? 0  Walking or climbing stairs? 0  Dressing or bathing? 0  Doing errands, shopping? 0  Preparing Food and eating ? N  Using the Toilet? N  In the past six months, have you accidently leaked urine? N  Do you have problems with loss of bowel control? N  Managing your Medications? N  Managing your Finances? N  Housekeeping or managing your  Housekeeping? N    Patient Care Team: Colene Dauphin, MD as PCP - General (Internal Medicine) Seward Dao Alleen Arbour, MD as Consulting Physician (Ophthalmology) Sharilyn Davenport Tino Foreman, [facility] (Inactive) as Pharmacist (Pharmacist) Seward Dao Alleen Arbour, MD as Consulting Physician (Ophthalmology) Devin Foerster, MD as Consulting Physician (Ophthalmology)  Indicate any recent Medical Services you may have received from other than [facility] providers in the past year (date may be approximate).     Assessment:    This is a routine wellness examination for [facility].  Hearing/Vision screen Hearing Screening - Comments:: Denies hearing difficulties    Vision Screening - Comments:: up to date with routine eye exams with Dr Seward Dao   Goals Addressed               This Visit's Progress     Patient Stated (pt-stated)        Patient stated he will continue to walk and watch diet more due to reflux issues       Depression Screen     08/19/2023   10:59 AM 04/29/2023    1:05 PM 08/02/2022   10:04 AM 04/26/2022    1:07 PM 04/28/2021    2:29 PM 04/23/2021   12:14 PM 04/18/2020    8:33 AM  PHQ 2/9 Scores  PHQ - 2 Score 0 0 0 0 0 0 0  PHQ- 9 Score 0   0 0 0     Fall Risk     08/19/2023   10:58 AM 04/29/2023    1:04 PM 08/02/2022   10:04 AM 04/26/2022    1:07 PM 04/28/2021    2:27 PM  Fall Risk   Falls in the past year? 0 0 0 0 0  Number falls in past yr: 0 0 0 0 0  Injury with Fall? 0  0 0 0  Risk for fall due to : No Fall Risks No Fall Risks  No Fall Risks No Fall Risks No Fall Risks  Follow up Falls evaluation completed;Falls prevention discussed Falls evaluation completed Falls evaluation completed Falls evaluation completed Falls evaluation completed    MEDICARE RISK AT HOME:  Medicare Risk at Home Any stairs in or around the home?: Yes If so, are there any without handrails?: No Home free of loose throw rugs in walkways, pet beds, electrical cords, etc?: Yes Adequate lighting in your home to reduce risk of  falls?: Yes Life alert?: No Use of a cane, walker or w/c?: No Grab bars in the bathroom?: Yes Shower chair or bench in shower?: Yes Elevated toilet seat or a handicapped toilet?: No  TIMED UP AND GO:  Was the test performed?  No  Cognitive Function: 6CIT completed        08/19/2023   11:07 AM 08/02/2022   10:05 AM  6CIT Screen  What Year? 0 points 0 points  What month? 0 points 0 points  What time? 0 points 0 points  Count back from 20 0 points 0 points  Months in reverse 0 points 0 points  Repeat phrase 0 points 0 points  Total Score 0 points 0 points    Immunizations Immunization History  Administered Date(s) Administered   Influenza Whole 12/21/2006, 12/20/2008   Influenza, High Dose Seasonal PF 01/20/2022   Influenza, Quadrivalent, Recombinant, Inj, Pf 12/15/2017   Influenza,inj,Quad PF,6+ Mos 01/02/2013, 01/22/2015, 01/13/2016, 01/13/2017, 12/21/2018   Influenza-Unspecified 01/10/2014, 01/09/2016, 12/15/2017, 12/31/2020   PFIZER(Purple Top)SARS-COV-2 Vaccination 05/24/2019, 06/19/2019, 01/24/2020   Pneumococcal Conjugate-13 04/12/2018   Pneumococcal Polysaccharide-23 04/17/2019   Td 03/23/2003   Tdap 01/29/2014   Zoster Recombinant(Shingrix) 08/23/2019, 11/29/2019   Zoster, Live 01/02/2013    Screening Tests Health Maintenance  Topic Date Due   COVID-19 Vaccine (4 - 2024-25 season) 11/21/2022   INFLUENZA VACCINE  10/21/2023   DTaP/Tdap/Td (3 - Td or Tdap) 01/30/2024   Medicare Annual Wellness (AWV)  08/18/2024   Colonoscopy  07/27/2028   Pneumonia Vaccine 24+ Years old  Completed   Hepatitis C Screening  Completed   Zoster Vaccines- Shingrix  Completed   HPV VACCINES  Aged Out   Meningococcal B Vaccine  Aged Out    Health Maintenance  Health Maintenance Due  Topic Date Due   COVID-19 Vaccine (4 - 2024-25 season) 11/21/2022   Health Maintenance Items Addressed: 08/19/2023   Additional Screening:  Vision Screening: Recommended annual  ophthalmology exams for early detection of glaucoma and other disorders of the eye.  Dental Screening: Recommended annual dental exams for proper oral hygiene  Community Resource Referral / Chronic Care Management: CRR required this visit?  No   CCM required this visit?  No   Plan:    I have personally reviewed and noted the following in the patient's chart:   Medical and social history Use of alcohol, tobacco or illicit drugs  Current medications and supplements including opioid prescriptions. Patient is not currently taking opioid prescriptions. Functional ability and status Nutritional status Physical activity Advanced directives List of other physicians Hospitalizations, surgeries, and ER visits in previous 12 months Vitals Screenings to include cognitive, depression, and falls Referrals and appointments  In addition, I have reviewed and discussed with patient certain preventive protocols, quality metrics, and best practice recommendations. A written personalized care plan for preventive services as well as general preventive health recommendations were provided to patient.   Patria Bookbinder, CMA   08/19/2023   After Visit Summary: (MyChart) Due to this being a telephonic  visit, the after visit summary with patients personalized plan was offered to patient via MyChart   Notes: Nothing significant to report at this time.

## 2023-09-09 DIAGNOSIS — R42 Dizziness and giddiness: Secondary | ICD-10-CM | POA: Diagnosis not present

## 2023-09-09 DIAGNOSIS — H608X3 Other otitis externa, bilateral: Secondary | ICD-10-CM | POA: Diagnosis not present

## 2023-09-09 DIAGNOSIS — Z9622 Myringotomy tube(s) status: Secondary | ICD-10-CM | POA: Diagnosis not present

## 2023-10-04 DIAGNOSIS — H40013 Open angle with borderline findings, low risk, bilateral: Secondary | ICD-10-CM | POA: Diagnosis not present

## 2023-10-04 DIAGNOSIS — H34212 Partial retinal artery occlusion, left eye: Secondary | ICD-10-CM | POA: Diagnosis not present

## 2023-10-04 DIAGNOSIS — H26492 Other secondary cataract, left eye: Secondary | ICD-10-CM | POA: Diagnosis not present

## 2023-10-04 DIAGNOSIS — Z961 Presence of intraocular lens: Secondary | ICD-10-CM | POA: Diagnosis not present

## 2023-10-17 ENCOUNTER — Other Ambulatory Visit: Payer: Self-pay | Admitting: Internal Medicine

## 2023-10-17 DIAGNOSIS — E782 Mixed hyperlipidemia: Secondary | ICD-10-CM

## 2023-10-25 ENCOUNTER — Other Ambulatory Visit (INDEPENDENT_AMBULATORY_CARE_PROVIDER_SITE_OTHER)

## 2023-10-25 DIAGNOSIS — E876 Hypokalemia: Secondary | ICD-10-CM

## 2023-10-25 DIAGNOSIS — I10 Essential (primary) hypertension: Secondary | ICD-10-CM | POA: Diagnosis not present

## 2023-10-25 DIAGNOSIS — R7303 Prediabetes: Secondary | ICD-10-CM

## 2023-10-25 LAB — BASIC METABOLIC PANEL WITH GFR
BUN: 16 mg/dL (ref 6–23)
CO2: 31 meq/L (ref 19–32)
Calcium: 8.7 mg/dL (ref 8.4–10.5)
Chloride: 98 meq/L (ref 96–112)
Creatinine, Ser: 0.96 mg/dL (ref 0.40–1.50)
GFR: 79.94 mL/min (ref >60.00)
Glucose, Bld: 102 mg/dL — ABNORMAL HIGH (ref 70–99)
Potassium: 3.2 meq/L — ABNORMAL LOW (ref 3.5–5.1)
Sodium: 137 meq/L (ref 135–145)

## 2023-10-25 LAB — HEMOGLOBIN A1C: Hgb A1c MFr Bld: 6.5 % (ref 4.6–6.5)

## 2023-10-30 NOTE — Patient Instructions (Addendum)
      Blood work was ordered for your next visit.       Medications changes include :   None    A referral was ordered and someone will call you to schedule an appointment.     Return in about 3 months (around 01/31/2024) for follow up.

## 2023-10-30 NOTE — Progress Notes (Unsigned)
 Subjective:    Patient ID: James Wilkie Raddle., male    DOB: 11-Aug-1950, 73 y.o.   MRN: [id number]     HPI James [name] is here for follow up of his chronic medical problems.   DM new  Walking 2 miles a day.    Can improve on sugars / carb intake.    Medications and allergies reviewed with patient and updated if appropriate.  Current Outpatient Medications on File Prior to Visit  Medication Sig Dispense Refill   albuterol  (VENTOLIN  HFA) 108 (90 Base) MCG/ACT inhaler Inhale 2 puffs into the lungs every 2 (two) hours as needed for wheezing or shortness of breath (cough). 8 g 0   aspirin  EC 81 MG tablet Take 81 mg by mouth daily.     atenolol -chlorthalidone  (TENORETIC ) 50-25 MG tablet TAKE 1 TABLET BY MOUTH DAILY 90 tablet 0   doxazosin  (CARDURA ) 8 MG tablet TAKE 1 TABLET BY MOUTH EVERY NIGHT AT BEDTIME 90 tablet 0   omeprazole  (PRILOSEC) 40 MG capsule Take 1 capsule (40 mg total) by mouth 2 (two) times daily before a meal. 180 capsule 1   potassium chloride  SA (KLOR-CON  M20) 20 MEQ tablet TAKE 2 TABLETS BY MOUTH TWICE A DAY 360 tablet 0   rosuvastatin  (CRESTOR ) 40 MG tablet TAKE 1 TABLET BY MOUTH DAILY 90 tablet 0   No current facility-administered medications on file prior to visit.     Review of Systems  Constitutional:  Negative for fever.  Respiratory:  Negative for cough, shortness of breath and wheezing.   Cardiovascular:  Negative for chest pain, palpitations and leg swelling.  Neurological:  Negative for light-headedness and headaches.       Objective:   Vitals:   10/31/23 1300  BP: 120/78  Pulse: 78  Temp: 98.1 F (36.7 C)  SpO2: 97%   BP Readings from Last 3 Encounters:  10/31/23 120/78  05/05/23 128/81  04/29/23 132/72   Wt Readings from Last 3 Encounters:  10/31/23 239 lb (108.4 kg)  08/19/23 230 lb (104.3 kg)  05/05/23 237 lb 12.8 oz (107.9 kg)   Body mass index is 29.09 kg/m.    Physical Exam Constitutional:      General: He is not in acute  distress.    Appearance: Normal appearance. He is not ill-appearing.  HENT:     Head: Normocephalic and atraumatic.  Eyes:     Conjunctiva/sclera: Conjunctivae normal.  Cardiovascular:     Rate and Rhythm: Normal rate and regular rhythm.     Heart sounds: Normal heart sounds.  Pulmonary:     Effort: Pulmonary effort is normal. No respiratory distress.     Breath sounds: Normal breath sounds. No wheezing or rales.  Musculoskeletal:     Right lower leg: No edema.     Left lower leg: No edema.  Skin:    General: Skin is warm and dry.     Findings: No rash.  Neurological:     Mental Status: He is alert. Mental status is at baseline.  Psychiatric:        Mood and Affect: Mood normal.       Diabetic Foot Exam - Simple   Simple Foot Form Diabetic Foot exam was performed with the following findings: Yes 10/31/2023  1:23 PM  Visual Inspection No deformities, no ulcerations, no other skin breakdown bilaterally: Yes Sensation Testing Intact to touch and monofilament testing bilaterally: Yes Pulse Check Posterior Tibialis and Dorsalis pulse intact bilaterally: Yes Comments  Lab Results  Component Value Date   WBC 6.9 04/22/2023   HGB 14.9 04/22/2023   HCT 43.9 04/22/2023   PLT 235.0 04/22/2023   GLUCOSE 102 (H) 10/25/2023   CHOL 130 04/22/2023   TRIG 91.0 04/22/2023   HDL 45.60 04/22/2023   LDLCALC 66 04/22/2023   ALT 14 04/22/2023   AST 17 04/22/2023   NA 137 10/25/2023   K 3.2 (L) 10/25/2023   CL 98 10/25/2023   CREATININE 0.96 10/25/2023   BUN 16 10/25/2023   CO2 31 10/25/2023   TSH 1.72 04/22/2023   PSA 2.14 04/22/2023   INR 0.99 05/28/2011   HGBA1C 6.5 10/25/2023     Assessment & Plan:     See Problem List for Assessment and Plan of chronic medical problems.

## 2023-10-31 ENCOUNTER — Ambulatory Visit: Payer: Medicare Other | Admitting: Internal Medicine

## 2023-10-31 ENCOUNTER — Encounter: Payer: Self-pay | Admitting: Internal Medicine

## 2023-10-31 VITALS — BP 120/78 | HR 78 | Temp 98.1°F | Ht 76.0 in | Wt 239.0 lb

## 2023-10-31 DIAGNOSIS — Z125 Encounter for screening for malignant neoplasm of prostate: Secondary | ICD-10-CM

## 2023-10-31 DIAGNOSIS — E1169 Type 2 diabetes mellitus with other specified complication: Secondary | ICD-10-CM

## 2023-10-31 DIAGNOSIS — K219 Gastro-esophageal reflux disease without esophagitis: Secondary | ICD-10-CM | POA: Diagnosis not present

## 2023-10-31 DIAGNOSIS — E782 Mixed hyperlipidemia: Secondary | ICD-10-CM

## 2023-10-31 DIAGNOSIS — E876 Hypokalemia: Secondary | ICD-10-CM

## 2023-10-31 DIAGNOSIS — I10 Essential (primary) hypertension: Secondary | ICD-10-CM | POA: Diagnosis not present

## 2023-10-31 NOTE — Assessment & Plan Note (Addendum)
 Chronic  Lab Results  Component Value Date   HGBA1C 6.5 10/25/2023   Low sugar / carb diet-he knows that he can make changes which will likely improve his sugars Stressed regular exercise Advised weight loss

## 2023-10-31 NOTE — Assessment & Plan Note (Signed)
Chronic GERD controlled Continue omeprazole 40 mg bid  

## 2023-10-31 NOTE — Assessment & Plan Note (Addendum)
 Chronic Secondary to chlorthalidone  Lab Results  Component Value Date   K 3.2 (L) 10/25/2023   Taking potassium chloride  100  mEq daily

## 2023-10-31 NOTE — Assessment & Plan Note (Signed)
 Chronic Regular exercise and healthy diet encouraged Lab Results  Component Value Date   LDLCALC 66 04/22/2023   Continue Crestor  40 mg daily

## 2023-10-31 NOTE — Assessment & Plan Note (Addendum)
 Chronic Blood pressure well controlled Reviewed CMP Continue atenolol -chlorthalidone  50-25 mg daily and doxazosin  8 mg nightly Due to persistently low potassium we will change atenolol -chol in 3 months (after he finishes his current prescription) to atenolol  50 mg and spironolactone 25 mg daily.  Discussed that he will need to come in for a BMP after the change and can monitor BP at home

## 2023-11-02 ENCOUNTER — Ambulatory Visit: Payer: Self-pay

## 2023-11-02 ENCOUNTER — Other Ambulatory Visit: Payer: Self-pay | Admitting: Internal Medicine

## 2023-11-02 MED ORDER — NIRMATRELVIR/RITONAVIR (PAXLOVID)TABLET: 3.0000 | ORAL_TABLET | Freq: Two times a day (BID) | ORAL | 0 refills | Status: AC

## 2023-11-02 NOTE — Telephone Encounter (Signed)
 FYI Only or Action Required?: Action required by provider: Requesting medication for Covid.  Patient was last seen in primary care on 10/31/2023 by Geofm Glade PARAS, MD.  Called Nurse Triage reporting Covid Positive.  Symptoms began several days ago.  Interventions attempted: OTC medications: Tylenol  and Rest, hydration, or home remedies.  Symptoms are: stable.  Triage Disposition: Home Care  Patient/caregiver understands and will follow disposition?: Yes    Copied from CRM [id number]. Topic: Clinical - Pink Word Triage >> Nov 02, 2023  1:44 PM James [name] wrote: Reason for Triage: patient's wife called in stated patient has tested positive for COVID and is experiencing vomiting, headaches and fever of 102.2 - patient's wife James [name] number [phone] Reason for Disposition  [1] Fever AND [2] no signs of serious infection or localizing symptoms (all other triage questions negative)  Answer Assessment - Initial Assessment Questions Pts wife stated. Woke up Tuesday AM not feeling well, 101.1 denies chest pain, lightheadedness, staying hydrated   1. TEMPERATURE: What is the most recent temperature?  How was it measured?      102.2 1 hour ago 2. ONSET: When did the fever start?      Tuesday AM 3. CHILLS: Do you have chills? If yes: How bad are they?  (e.g., none, mild, moderate, severe)     On and off chills, Modersate 4. OTHER SYMPTOMS: Do you have any other symptoms besides the fever?  (e.g., abdomen pain, cough, diarrhea, earache, headache, sore throat, urination pain)     Vomitting, congestion, 5. CAUSE: If there are no symptoms, ask: What do you think is causing the fever?      Covid positive 6. CONTACTS: Does anyone else in the family have an infection?     Pts wife states she feels slightly drained on Saturday but not feeling sick. 7. TREATMENT: What have you done so far to treat this fever? (e.g., OTC fever medicines)     Tylenol , 8. IMMUNOCOMPROMISE: Do you  have any of the following: diabetes, HIV positive, splenectomy, cancer chemotherapy, chronic steroid treatment, transplant patient, etc.?     Recently diagnosed with diabetes, managed with diet and exercise  Protocols used: Fever-[name]-AH

## 2023-11-02 NOTE — Telephone Encounter (Signed)
 Spoke with spouse today and information given.  All questions answered as well.

## 2023-11-02 NOTE — Telephone Encounter (Signed)
 We can start Paxlovid .  He needs to hold the Crestor -his cholesterol medication while on the Paxlovid  and can be restarted afterwards.  Will send Paxlovid  in different encounter to pharmacy on file.

## 2023-12-01 ENCOUNTER — Other Ambulatory Visit: Payer: Self-pay | Admitting: Internal Medicine

## 2023-12-05 DIAGNOSIS — Z9889 Other specified postprocedural states: Secondary | ICD-10-CM | POA: Diagnosis not present

## 2023-12-05 DIAGNOSIS — H43812 Vitreous degeneration, left eye: Secondary | ICD-10-CM | POA: Diagnosis not present

## 2023-12-05 DIAGNOSIS — H35373 Puckering of macula, bilateral: Secondary | ICD-10-CM | POA: Diagnosis not present

## 2023-12-06 ENCOUNTER — Other Ambulatory Visit: Payer: Self-pay | Admitting: Internal Medicine

## 2023-12-26 ENCOUNTER — Other Ambulatory Visit: Payer: Self-pay | Admitting: Internal Medicine

## 2024-01-12 ENCOUNTER — Other Ambulatory Visit: Payer: Self-pay | Admitting: Internal Medicine

## 2024-01-12 DIAGNOSIS — E782 Mixed hyperlipidemia: Secondary | ICD-10-CM

## 2024-01-16 ENCOUNTER — Telehealth: Payer: Self-pay

## 2024-01-16 DIAGNOSIS — I10 Essential (primary) hypertension: Secondary | ICD-10-CM

## 2024-01-16 NOTE — Telephone Encounter (Signed)
 Copied from CRM #[id number]. Topic: Clinical - Medication Question >> Jan 16, 2024  1:04 PM Taleah C wrote: Reason for CRM: pt called and asked to speak w/ James [name] about changing his medication, atenolol -chlorthalidone . He explained that it relates to his potassium levels. Please call and advise w/patient.

## 2024-01-19 MED ORDER — ATENOLOL 50 MG PO TABS: 50.0000 mg | ORAL_TABLET | Freq: Every day | ORAL | 3 refills | Status: AC

## 2024-01-19 MED ORDER — SPIRONOLACTONE 25 MG PO TABS: 25.0000 mg | ORAL_TABLET | Freq: Every day | ORAL | 3 refills | Status: AC

## 2024-01-19 NOTE — Telephone Encounter (Signed)
 Spoke with patient today and lab appointment made.

## 2024-01-19 NOTE — Telephone Encounter (Signed)
 Stop atenolol -chlorthalidone  and the potassium.  Start atenolol  and spironolactone.  Blood work in 1-2 weeks after the change-no need to fast.  New prescription was sent to [facility].

## 2024-01-19 NOTE — Addendum Note (Signed)
 Addended by: GEOFM GLADE PARAS on: 01/19/2024 12:02 PM   Modules accepted: Orders

## 2024-01-30 ENCOUNTER — Other Ambulatory Visit (INDEPENDENT_AMBULATORY_CARE_PROVIDER_SITE_OTHER)

## 2024-01-30 DIAGNOSIS — Z125 Encounter for screening for malignant neoplasm of prostate: Secondary | ICD-10-CM

## 2024-01-30 DIAGNOSIS — I10 Essential (primary) hypertension: Secondary | ICD-10-CM

## 2024-01-30 DIAGNOSIS — E1169 Type 2 diabetes mellitus with other specified complication: Secondary | ICD-10-CM

## 2024-01-30 DIAGNOSIS — E782 Mixed hyperlipidemia: Secondary | ICD-10-CM | POA: Diagnosis not present

## 2024-01-30 LAB — COMPREHENSIVE METABOLIC PANEL WITH GFR
ALT: 18 U/L (ref 0–53)
AST: 18 U/L (ref 0–37)
Albumin: 4.3 g/dL (ref 3.5–5.2)
Alkaline Phosphatase: 52 U/L (ref 39–117)
BUN: 14 mg/dL (ref 6–23)
CO2: 25 meq/L (ref 19–32)
Calcium: 8.8 mg/dL (ref 8.4–10.5)
Chloride: 101 meq/L (ref 96–112)
Creatinine, Ser: 0.94 mg/dL (ref 0.40–1.50)
GFR: 81.83 mL/min (ref >60.00)
Glucose, Bld: 93 mg/dL (ref 70–99)
Potassium: 4.1 meq/L (ref 3.5–5.1)
Sodium: 136 meq/L (ref 135–145)
Total Bilirubin: 0.7 mg/dL (ref 0.2–1.2)
Total Protein: 6.4 g/dL (ref 6.0–8.3)

## 2024-01-30 LAB — CBC WITH DIFFERENTIAL/PLATELET
Basophils Absolute: 0 K/uL (ref 0.0–0.1)
Basophils Relative: 0.7 % (ref 0.0–3.0)
Eosinophils Absolute: 0.3 K/uL (ref 0.0–0.7)
Eosinophils Relative: 5.8 % — ABNORMAL HIGH (ref 0.0–5.0)
HCT: 41.1 % (ref 39.0–52.0)
Hemoglobin: 14.1 g/dL (ref 13.0–17.0)
Lymphocytes Relative: 21.8 % (ref 12.0–46.0)
Lymphs Abs: 1.3 K/uL (ref 0.7–4.0)
MCHC: 34.4 g/dL (ref 30.0–36.0)
MCV: 87.3 fl (ref 78.0–100.0)
Monocytes Absolute: 0.4 K/uL (ref 0.1–1.0)
Monocytes Relative: 6.5 % (ref 3.0–12.0)
Neutro Abs: 3.8 K/uL (ref 1.4–7.7)
Neutrophils Relative %: 65.2 % (ref 43.0–77.0)
Platelets: 221 K/uL (ref 150.0–400.0)
RBC: 4.71 Mil/uL (ref 4.22–5.81)
RDW: 14.1 % (ref 11.5–15.5)
WBC: 5.8 K/uL (ref 4.0–10.5)

## 2024-01-30 LAB — LIPID PANEL
Cholesterol: 119 mg/dL (ref 0–200)
HDL: 47.2 mg/dL (ref >39.00)
LDL Cholesterol: 57 mg/dL (ref 0–99)
NonHDL: 71.5
Total CHOL/HDL Ratio: 3
Triglycerides: 72 mg/dL (ref 0.0–149.0)
VLDL: 14.4 mg/dL (ref 0.0–40.0)

## 2024-01-30 LAB — TSH: TSH: 1.21 u[IU]/mL (ref 0.35–5.50)

## 2024-01-30 LAB — MICROALBUMIN / CREATININE URINE RATIO
Creatinine,U: 143.5 mg/dL
Microalb Creat Ratio: UNDETERMINED mg/g (ref 0.0–30.0)
Microalb, Ur: <0.7 mg/dL

## 2024-01-30 LAB — HEMOGLOBIN A1C: Hgb A1c MFr Bld: 6 % (ref 4.6–6.5)

## 2024-01-30 LAB — PSA, MEDICARE: PSA: 2.12 ng/mL (ref 0.10–4.00)

## 2024-02-01 ENCOUNTER — Ambulatory Visit: Payer: Self-pay | Admitting: Internal Medicine

## 2024-04-03 NOTE — Progress Notes (Signed)
 James Heeg Jr.                                          MRN: [id number]   04/03/2024   The [facility] Quality Team Specialist reviewed this patient medical record for the purposes of chart review for care gap closure. The following were reviewed: abstraction for care gap closure-glycemic status assessment.    [facility] Quality Team

## 2024-04-14 ENCOUNTER — Other Ambulatory Visit: Payer: Self-pay | Admitting: Internal Medicine

## 2024-04-14 DIAGNOSIS — E782 Mixed hyperlipidemia: Secondary | ICD-10-CM

## 2024-04-19 ENCOUNTER — Telehealth: Payer: Self-pay

## 2024-04-19 NOTE — Telephone Encounter (Signed)
 Copied from CRM #[id number]. Topic: Clinical - Request for Lab/Test Order >> Apr 19, 2024 10:26 AM Adelita E wrote: Reason for CRM: Patient is wanting lab orders placed so he can have these completed before he comes in for his physical on 2/9.

## 2024-04-23 ENCOUNTER — Other Ambulatory Visit: Payer: Self-pay

## 2024-04-23 DIAGNOSIS — E1169 Type 2 diabetes mellitus with other specified complication: Secondary | ICD-10-CM

## 2024-04-23 DIAGNOSIS — I10 Essential (primary) hypertension: Secondary | ICD-10-CM

## 2024-04-23 DIAGNOSIS — E782 Mixed hyperlipidemia: Secondary | ICD-10-CM

## 2024-04-25 ENCOUNTER — Other Ambulatory Visit

## 2024-04-25 ENCOUNTER — Ambulatory Visit

## 2024-04-25 VITALS — BP 122/72 | HR 60 | Ht 75.0 in | Wt 238.0 lb

## 2024-04-25 DIAGNOSIS — E782 Mixed hyperlipidemia: Secondary | ICD-10-CM | POA: Diagnosis not present

## 2024-04-25 DIAGNOSIS — E1169 Type 2 diabetes mellitus with other specified complication: Secondary | ICD-10-CM

## 2024-04-25 DIAGNOSIS — Z0000 Encounter for general adult medical examination without abnormal findings: Secondary | ICD-10-CM

## 2024-04-25 DIAGNOSIS — I10 Essential (primary) hypertension: Secondary | ICD-10-CM

## 2024-04-25 LAB — LIPID PANEL
Cholesterol: 112 mg/dL (ref 28–200)
HDL: 47.7 mg/dL
LDL Cholesterol: 53 mg/dL (ref 10–99)
NonHDL: 64.16
Total CHOL/HDL Ratio: 2
Triglycerides: 55 mg/dL (ref 10.0–149.0)
VLDL: 11 mg/dL (ref 0.0–40.0)

## 2024-04-25 LAB — CBC WITH DIFFERENTIAL/PLATELET
Basophils Absolute: 0.1 10*3/uL (ref 0.0–0.1)
Basophils Relative: 0.9 % (ref 0.0–3.0)
Eosinophils Absolute: 0.5 10*3/uL (ref 0.0–0.7)
Eosinophils Relative: 8.5 % — ABNORMAL HIGH (ref 0.0–5.0)
HCT: 40 % (ref 39.0–52.0)
Hemoglobin: 13.9 g/dL (ref 13.0–17.0)
Lymphocytes Relative: 24.9 % (ref 12.0–46.0)
Lymphs Abs: 1.4 10*3/uL (ref 0.7–4.0)
MCHC: 34.7 g/dL (ref 30.0–36.0)
MCV: 91.2 fl (ref 78.0–100.0)
Monocytes Absolute: 0.4 10*3/uL (ref 0.1–1.0)
Monocytes Relative: 6.7 % (ref 3.0–12.0)
Neutro Abs: 3.3 10*3/uL (ref 1.4–7.7)
Neutrophils Relative %: 59 % (ref 43.0–77.0)
Platelets: 203 10*3/uL (ref 150.0–400.0)
RBC: 4.39 Mil/uL (ref 4.22–5.81)
RDW: 14 % (ref 11.5–15.5)
WBC: 5.5 10*3/uL (ref 4.0–10.5)

## 2024-04-25 LAB — TSH: TSH: 1.56 u[IU]/mL (ref 0.35–5.50)

## 2024-04-25 LAB — HEMOGLOBIN A1C: Hgb A1c MFr Bld: 6 % (ref 4.6–6.5)

## 2024-04-25 NOTE — Progress Notes (Addendum)
 "  Chief Complaint  Patient presents with   Medicare Wellness     Subjective:   James [name] is a 74 y.o. male who presents for a Medicare Annual Wellness Visit.  Visit info / Clinical Intake: Medicare Wellness Visit Type:: Subsequent Annual Wellness Visit Persons participating in visit and providing information:: patient Medicare Wellness Visit Mode:: In-person (required for WTM) Interpreter Needed?: No Pre-visit prep was completed: yes AWV questionnaire completed by patient prior to visit?: no Living arrangements:: lives with spouse/significant other Patient's Overall Health Status Rating: good Typical amount of pain: none Does pain affect daily life?: no Are you currently prescribed opioids?: no  Dietary Habits and Nutritional Risks How many meals a day?: 2 Eats fruit and vegetables daily?: yes Most meals are obtained by: preparing own meals; eating out In the last 2 weeks, have you had any of the following?: none Diabetic:: (!) yes Any non-healing wounds?: no How often do you check your BS?: 0 Would you like to be referred to a Nutritionist or for Diabetic Management? : no  Functional Status Activities of Daily Living (to include ambulation/medication): Independent Ambulation: Independent Medication Administration: Independent Home Management (perform basic housework or laundry): Independent Manage your own finances?: yes Primary transportation is: driving Concerns about vision?: no *vision screening is required for WTM* Concerns about hearing?: no  Fall Screening Falls in the past year?: 0 Number of falls in past year: 0 Was there an injury with Fall?: 0 Fall Risk Category Calculator: 0 Patient Fall Risk Level: Low Fall Risk  Fall Risk Patient at Risk for Falls Due to: No Fall Risks Fall risk Follow up: Falls evaluation completed; Falls prevention discussed  Home and Transportation Safety: All rugs have non-skid backing?: N/A, no rugs All stairs or steps  have railings?: yes Grab bars in the bathtub or shower?: yes Have non-skid surface in bathtub or shower?: yes Good home lighting?: yes Regular seat belt use?: yes Hospital stays in the last year:: no  Cognitive Assessment Difficulty concentrating, remembering, or making decisions? : no Will 6CIT or Mini Cog be Completed: yes What year is it?: 0 points What month is it?: 0 points Give patient an address phrase to remember (5 components): [address] [place], [place] About what time is it?: 0 points Count backwards from 20 to 1: 0 points Say the months of the year in reverse: 0 points Repeat the address phrase from earlier: 0 points 6 CIT Score: 0 points  Advance Directives (For Healthcare) Does Patient Have a Medical Advance Directive?: Yes Does patient want to make changes to medical advance directive?: Yes (Inpatient - patient requests chaplain consult to change a medical advance directive) Type of Advance Directive: Healthcare Power of [place]; Living will Copy of Healthcare Power of Attorney in Chart?: Yes - validated most recent copy scanned in chart (See row information) Copy of Living Will in Chart?: Yes - validated most recent copy scanned in chart (See row information)  Reviewed/Updated  Reviewed/Updated: Reviewed All (Medical, Surgical, Family, Medications, Allergies, Care Teams, Patient Goals)    Allergies (verified) Hydrocodone    Current Medications (verified) Outpatient Encounter Medications as of 04/25/2024  Medication Sig   albuterol  (VENTOLIN  HFA) 108 (90 Base) MCG/ACT inhaler Inhale 2 puffs into the lungs every 2 (two) hours as needed for wheezing or shortness of breath (cough).   aspirin  EC 81 MG tablet Take 81 mg by mouth daily.   atenolol  (TENORMIN ) 50 MG tablet Take 1 tablet (50 mg total) by mouth daily.  doxazosin  (CARDURA ) 8 MG tablet TAKE 1 TABLET BY MOUTH EVERY NIGHT AT BEDTIME   omeprazole  (PRILOSEC) 40 MG capsule Take 1 capsule (40 mg total) by mouth  2 (two) times daily before a meal.   rosuvastatin  (CRESTOR ) 40 MG tablet TAKE 1 TABLET BY MOUTH DAILY   spironolactone  (ALDACTONE ) 25 MG tablet Take 1 tablet (25 mg total) by mouth daily.   No facility-administered encounter medications on file as of 04/25/2024.    History: Past Medical History:  Diagnosis Date   Allergy    seasonal   Angina    Anxiety    especially revolving around surgery & medical care    Cancer (HCC)    forehead stage 1 melonoma,year ago updated 07/06/21   GERD (gastroesophageal reflux disease)    Heart murmur    Hemorrhoids    History of stress test 03/23/2011   told it was normal   Hyperlipidemia    Hypertension    Neuromuscular disorder (HCC)    spinal stenosis    Shortness of breath    Past Surgical History:  Procedure Laterality Date   APPENDECTOMY     CERVICAL DISC SURGERY     COLONOSCOPY     EYE SURGERY  11/29/2009   x3- (total of 7 eye surgery)    LUMBAR LAMINECTOMY  11/20/1988   x2   MOHS SURGERY     2022,updated 07/06/21   TESTICLE TORSION REDUCTION     UPPER GASTROINTESTINAL ENDOSCOPY     Family History  Problem Relation Age of Onset   Cancer Mother        skin cancer/melanoma   Hypertension Father    Heart disease Father    Colon cancer Neg Hx    Colon polyps Neg Hx    Crohn's disease Neg Hx    Esophageal cancer Neg Hx    Rectal cancer Neg Hx    Stomach cancer Neg Hx    Social History   Occupational History   Not on file  Tobacco Use   Smoking status: Never    Passive exposure: Never   Smokeless tobacco: Never  Vaping Use   Vaping status: Never Used  Substance and Sexual Activity   Alcohol use: Yes    Alcohol/week: 1.0 standard drink of alcohol    Types: 1 Glasses of wine per week    Comment: 1 bottle wine a week, on weekends    Drug use: Not Currently    Types: Marijuana    Comment: recreationally- early August- 2016   Sexual activity: Yes   Tobacco Counseling Counseling given: No  SDOH Screenings   Food  Insecurity: No Food Insecurity (04/25/2024)  Housing: Low Risk (04/25/2024)  Transportation Needs: No Transportation Needs (04/25/2024)  Utilities: Not At Risk (04/25/2024)  Alcohol Screen: Low Risk (10/28/2023)  Depression (PHQ2-9): Low Risk (04/25/2024)  Financial Resource Strain: Low Risk (10/28/2023)  Physical Activity: Sufficiently Active (04/25/2024)  Social Connections: Socially Integrated (04/25/2024)  Stress: No Stress Concern Present (04/25/2024)  Tobacco Use: Low Risk (04/25/2024)  Health Literacy: Adequate Health Literacy (04/25/2024)   See flowsheets for full screening details  Depression Screen PHQ 2 & 9 Depression Scale- Over the past 2 weeks, how often have you been bothered by any of the following problems? Little interest or pleasure in doing things: 0 Feeling down, depressed, or hopeless (PHQ Adolescent also includes...irritable): 0 PHQ-2 Total Score: 0 Trouble falling or staying asleep, or sleeping too much: 0 Feeling tired or having little energy: 0 Poor appetite or  overeating (PHQ Adolescent also includes...weight loss): 0 Feeling bad about yourself - or that you are a failure or have let yourself or your family down: 0 Trouble concentrating on things, such as reading the newspaper or watching television (PHQ Adolescent also includes...like school work): 0 Moving or speaking so slowly that other people could have noticed. Or the opposite - being so fidgety or restless that you have been moving around a lot more than usual: 0 Thoughts that you would be better off dead, or of hurting yourself in some way: 0 PHQ-9 Total Score: 0 If you checked off any problems, how difficult have these problems made it for you to do your work, take care of things at home, or get along with other people?: Not difficult at all  Depression Treatment Depression Interventions/Treatment : [id number] Score <4 Follow-up Not Indicated     Goals Addressed               This Visit's Progress     Patient Stated  (pt-stated)        Patient stated he plans to continue taking their meds daily and watching his diet (sugar intake)             Objective:    Today's Vitals   04/25/24 0848  BP: 122/72  Pulse: 60  SpO2: 98%  Weight: 238 lb 0.3 oz (108 kg)  Height: 6' 3 (1.905 m)   Body mass index is 29.75 kg/m.  Hearing/Vision screen Hearing Screening - Comments:: Denies hearing difficulties   Vision Screening - Comments:: Denies vision concerns - up to date with routine eye exams with Medford Gaudy &amp; Dr Elner Immunizations and Health Maintenance Health Maintenance  Topic Date Due   COVID-19 Vaccine (4 - 2025-26 season) 11/21/2023   DTaP/Tdap/Td (3 - Td or Tdap) 01/30/2024   Diabetic kidney evaluation - Urine ACR  07/29/2024   HEMOGLOBIN A1C  07/29/2024   OPHTHALMOLOGY EXAM  10/03/2024   FOOT EXAM  10/30/2024   Diabetic kidney evaluation - eGFR measurement  01/29/2025   Medicare Annual Wellness (AWV)  04/25/2025   Colonoscopy  07/27/2028   Pneumococcal Vaccine: 50+ Years  Completed   Influenza Vaccine  Completed   Hepatitis C Screening  Completed   Zoster Vaccines- Shingrix  Completed   Meningococcal B Vaccine  Aged Out        Assessment/Plan:  This is a routine wellness examination for [facility].  Patient Care Team: Geofm Glade PARAS, MD as PCP - General (Internal Medicine) Elner Arley LABOR, MD as Consulting Physician (Ophthalmology) Rozella Toribio BROCKS, [facility] (Inactive) as Pharmacist (Pharmacist) Elner Arley LABOR, MD as Consulting Physician (Ophthalmology) Gaudy Bruckner, MD as Consulting Physician (Ophthalmology) Luciano Standing, MD as Consulting Physician (Otolaryngology)  I have personally reviewed and noted the following in the patients chart:   Medical and social history Use of alcohol, tobacco or illicit drugs  Current medications and supplements including opioid prescriptions. Functional ability and status Nutritional status Physical activity Advanced directives List  of other physicians Hospitalizations, surgeries, and ER visits in previous 12 months Vitals Screenings to include cognitive, depression, and falls Referrals and appointments  No orders of the defined types were placed in this encounter.  In addition, I have reviewed and discussed with patient certain preventive protocols, quality metrics, and best practice recommendations. A written personalized care plan for preventive services as well as general preventive health recommendations were provided to patient.   James [name], CMA   04/25/2024   Return  in 1 year (on 04/25/2025).  After Visit Summary: (In Person-Declined) Patient declined AVS at this time.  Nurse Notes: scheduled 2027 AWV appt "

## 2024-04-25 NOTE — Patient Instructions (Signed)
 James [name],  Thank you for taking the time for your Medicare Wellness Visit. I appreciate your continued commitment to your health goals. Please review the care plan we discussed, and feel free to reach out if I can assist you further.  Please note that Annual Wellness Visits do not include a physical exam. Some assessments may be limited, especially if the visit was conducted virtually. If needed, we may recommend an in-person follow-up with your provider.  Ongoing Care Seeing your primary care provider every 3 to 6 months helps us  monitor your health and provide consistent, personalized care.   Referrals If a referral was made during today's visit and you haven't received any updates within two weeks, please contact the referred provider directly to check on the status.  Recommended Screenings:  Health Maintenance  Topic Date Due   Flu Shot  10/21/2023   COVID-19 Vaccine (4 - 2025-26 season) 11/21/2023   DTaP/Tdap/Td vaccine (3 - Td or Tdap) 01/30/2024   Kidney health urinalysis for diabetes  07/29/2024   Hemoglobin A1C  07/29/2024   Eye exam for diabetics  10/03/2024   Complete foot exam   10/30/2024   Yearly kidney function blood test for diabetes  01/29/2025   Medicare Annual Wellness Visit  04/25/2025   Colon Cancer Screening  07/27/2028   Pneumococcal Vaccine for age over 17  Completed   Hepatitis C Screening  Completed   Zoster (Shingles) Vaccine  Completed   Meningitis B Vaccine  Aged Out       04/25/2024    8:49 AM  Advanced Directives  Does Patient Have a Medical Advance Directive? Yes  Type of Estate Agent of [place];Living will  Does patient want to make changes to medical advance directive? Yes (Inpatient - patient requests chaplain consult to change a medical advance directive)  Copy of Healthcare Power of Attorney in Chart? Yes - validated most recent copy scanned in chart (See row information)    Vision: Annual vision screenings are  recommended for early detection of glaucoma, cataracts, and diabetic retinopathy. These exams can also reveal signs of chronic conditions such as diabetes and high blood pressure.  Dental: Annual dental screenings help detect early signs of oral cancer, gum disease, and other conditions linked to overall health, including heart disease and diabetes.

## 2024-04-30 ENCOUNTER — Encounter: Admitting: Internal Medicine

## 2024-06-28 ENCOUNTER — Ambulatory Visit

## 2024-06-28 ENCOUNTER — Ambulatory Visit (HOSPITAL_COMMUNITY)

## 2024-08-22 ENCOUNTER — Ambulatory Visit

## 2025-05-01 ENCOUNTER — Encounter: Admitting: Internal Medicine

## 2025-05-01 ENCOUNTER — Ambulatory Visit
